# Patient Record
Sex: Female | Born: 1955
Health system: Southern US, Community
[De-identification: ages and names within clinical notes are randomized; demographics above are authoritative.]

## PROBLEM LIST (undated history)

## (undated) DIAGNOSIS — E079 Disorder of thyroid, unspecified: Secondary | ICD-10-CM

## (undated) DIAGNOSIS — R51 Headache: Secondary | ICD-10-CM

## (undated) DIAGNOSIS — M1990 Unspecified osteoarthritis, unspecified site: Secondary | ICD-10-CM

## (undated) DIAGNOSIS — I639 Cerebral infarction, unspecified: Secondary | ICD-10-CM

## (undated) DIAGNOSIS — E785 Hyperlipidemia, unspecified: Secondary | ICD-10-CM

## (undated) DIAGNOSIS — F329 Major depressive disorder, single episode, unspecified: Secondary | ICD-10-CM

## (undated) DIAGNOSIS — E039 Hypothyroidism, unspecified: Secondary | ICD-10-CM

## (undated) DIAGNOSIS — R748 Abnormal levels of other serum enzymes: Secondary | ICD-10-CM

## (undated) DIAGNOSIS — G43909 Migraine, unspecified, not intractable, without status migrainosus: Secondary | ICD-10-CM

## (undated) DIAGNOSIS — M810 Age-related osteoporosis without current pathological fracture: Secondary | ICD-10-CM

## (undated) DIAGNOSIS — F32A Depression, unspecified: Secondary | ICD-10-CM

## (undated) HISTORY — DX: Migraine, unspecified, not intractable, without status migrainosus: G43.909

## (undated) HISTORY — DX: Age-related osteoporosis without current pathological fracture: M81.0

## (undated) HISTORY — DX: Cerebral infarction, unspecified: I63.9

## (undated) HISTORY — PX: CHOLECYSTECTOMY: SHX55

## (undated) HISTORY — DX: Headache: R51

## (undated) HISTORY — PX: CATARACT EXTRACTION: SUR2

## (undated) HISTORY — DX: Major depressive disorder, single episode, unspecified: F32.9

## (undated) HISTORY — DX: Abnormal levels of other serum enzymes: R74.8

## (undated) HISTORY — DX: Depression, unspecified: F32.A

## (undated) HISTORY — PX: JOINT REPLACEMENT: SHX530

## (undated) HISTORY — DX: Hyperlipidemia, unspecified: E78.5

## (undated) HISTORY — DX: Disorder of thyroid, unspecified: E07.9

## (undated) HISTORY — DX: Unspecified osteoarthritis, unspecified site: M19.90

---

## 1999-04-04 ENCOUNTER — Other Ambulatory Visit: Admission: RE | Admit: 1999-04-04 | Discharge: 1999-04-04 | Payer: Self-pay | Admitting: *Deleted

## 1999-06-29 ENCOUNTER — Encounter: Payer: Self-pay | Admitting: *Deleted

## 1999-06-29 ENCOUNTER — Ambulatory Visit (HOSPITAL_COMMUNITY): Admission: RE | Admit: 1999-06-29 | Discharge: 1999-06-29 | Payer: Self-pay | Admitting: *Deleted

## 2000-12-03 DIAGNOSIS — I639 Cerebral infarction, unspecified: Secondary | ICD-10-CM

## 2000-12-03 HISTORY — DX: Cerebral infarction, unspecified: I63.9

## 2000-12-27 ENCOUNTER — Encounter: Payer: Self-pay | Admitting: *Deleted

## 2000-12-27 ENCOUNTER — Inpatient Hospital Stay (HOSPITAL_COMMUNITY): Admission: EM | Admit: 2000-12-27 | Discharge: 2000-12-29 | Payer: Self-pay | Admitting: Emergency Medicine

## 2001-01-23 ENCOUNTER — Encounter: Admission: RE | Admit: 2001-01-23 | Discharge: 2001-04-03 | Payer: Self-pay | Admitting: *Deleted

## 2001-09-18 ENCOUNTER — Encounter: Admission: RE | Admit: 2001-09-18 | Discharge: 2001-12-17 | Payer: Self-pay | Admitting: *Deleted

## 2001-12-10 ENCOUNTER — Ambulatory Visit (HOSPITAL_COMMUNITY): Admission: RE | Admit: 2001-12-10 | Discharge: 2001-12-10 | Payer: Self-pay | Admitting: *Deleted

## 2001-12-10 ENCOUNTER — Encounter: Payer: Self-pay | Admitting: *Deleted

## 2002-07-01 ENCOUNTER — Encounter: Admission: RE | Admit: 2002-07-01 | Discharge: 2002-09-29 | Payer: Self-pay | Admitting: *Deleted

## 2003-02-06 ENCOUNTER — Ambulatory Visit (HOSPITAL_COMMUNITY): Admission: RE | Admit: 2003-02-06 | Discharge: 2003-02-06 | Payer: Self-pay | Admitting: Family Medicine

## 2004-09-04 HISTORY — PX: LAPAROSCOPIC GASTRIC BANDING: SHX1100

## 2004-09-19 ENCOUNTER — Ambulatory Visit: Payer: Self-pay | Admitting: Internal Medicine

## 2004-09-21 ENCOUNTER — Ambulatory Visit (HOSPITAL_COMMUNITY): Admission: RE | Admit: 2004-09-21 | Discharge: 2004-09-21 | Payer: Self-pay | Admitting: Internal Medicine

## 2004-10-26 ENCOUNTER — Encounter: Admission: RE | Admit: 2004-10-26 | Discharge: 2004-10-26 | Payer: Self-pay | Admitting: *Deleted

## 2005-03-10 ENCOUNTER — Ambulatory Visit (HOSPITAL_COMMUNITY): Admission: RE | Admit: 2005-03-10 | Discharge: 2005-03-10 | Payer: Self-pay | Admitting: General Surgery

## 2005-03-15 ENCOUNTER — Ambulatory Visit (HOSPITAL_COMMUNITY): Admission: RE | Admit: 2005-03-15 | Discharge: 2005-03-15 | Payer: Self-pay | Admitting: General Surgery

## 2005-03-27 ENCOUNTER — Encounter: Admission: RE | Admit: 2005-03-27 | Discharge: 2005-06-25 | Payer: Self-pay | Admitting: General Surgery

## 2005-05-09 ENCOUNTER — Ambulatory Visit (HOSPITAL_COMMUNITY): Admission: RE | Admit: 2005-05-09 | Discharge: 2005-05-10 | Payer: Self-pay | Admitting: General Surgery

## 2005-08-03 ENCOUNTER — Encounter: Admission: RE | Admit: 2005-08-03 | Discharge: 2005-09-03 | Payer: Self-pay | Admitting: General Surgery

## 2005-09-27 ENCOUNTER — Encounter: Admission: RE | Admit: 2005-09-27 | Discharge: 2005-12-26 | Payer: Self-pay | Admitting: General Surgery

## 2005-12-27 ENCOUNTER — Encounter: Admission: RE | Admit: 2005-12-27 | Discharge: 2005-12-27 | Payer: Self-pay | Admitting: General Surgery

## 2006-04-30 ENCOUNTER — Encounter: Admission: RE | Admit: 2006-04-30 | Discharge: 2006-04-30 | Payer: Self-pay | Admitting: General Surgery

## 2006-06-18 ENCOUNTER — Encounter: Admission: RE | Admit: 2006-06-18 | Discharge: 2006-09-16 | Payer: Self-pay | Admitting: General Surgery

## 2007-07-18 ENCOUNTER — Ambulatory Visit (HOSPITAL_COMMUNITY): Admission: RE | Admit: 2007-07-18 | Discharge: 2007-07-18 | Payer: Self-pay | Admitting: Family Medicine

## 2008-12-28 ENCOUNTER — Other Ambulatory Visit: Admission: RE | Admit: 2008-12-28 | Discharge: 2008-12-28 | Payer: Self-pay | Admitting: Family Medicine

## 2008-12-31 ENCOUNTER — Ambulatory Visit (HOSPITAL_COMMUNITY): Admission: RE | Admit: 2008-12-31 | Discharge: 2008-12-31 | Payer: Self-pay | Admitting: Family Medicine

## 2010-02-09 ENCOUNTER — Ambulatory Visit (HOSPITAL_COMMUNITY): Admission: RE | Admit: 2010-02-09 | Discharge: 2010-02-09 | Payer: Self-pay | Admitting: Family Medicine

## 2010-02-25 ENCOUNTER — Encounter: Payer: Self-pay | Admitting: Family Medicine

## 2010-02-25 LAB — CONVERTED CEMR LAB
ALT: 19 units/L
AST: 18 units/L
Albumin: 4.4 g/dL
Alkaline Phosphatase: 150 units/L
Anion Gap: 12.5
BUN: 18 mg/dL
CO2: 28 meq/L
Calcium: 9.5 mg/dL
Chloride: 105 meq/L
Creatinine, Ser: 0.84 mg/dL
Glucose, Bld: 94 mg/dL
Potassium: 4.8 meq/L
Sodium: 141 meq/L
Total Bilirubin: 0.4 mg/dL
Total Protein: 6.9 g/dL

## 2010-04-06 ENCOUNTER — Ambulatory Visit: Payer: Self-pay | Admitting: Family Medicine

## 2010-04-06 DIAGNOSIS — E039 Hypothyroidism, unspecified: Secondary | ICD-10-CM | POA: Insufficient documentation

## 2010-04-06 DIAGNOSIS — Z8679 Personal history of other diseases of the circulatory system: Secondary | ICD-10-CM | POA: Insufficient documentation

## 2010-04-06 DIAGNOSIS — R51 Headache: Secondary | ICD-10-CM | POA: Insufficient documentation

## 2010-04-06 DIAGNOSIS — E785 Hyperlipidemia, unspecified: Secondary | ICD-10-CM | POA: Insufficient documentation

## 2010-04-06 DIAGNOSIS — Z87898 Personal history of other specified conditions: Secondary | ICD-10-CM | POA: Insufficient documentation

## 2010-04-06 DIAGNOSIS — F32A Depression, unspecified: Secondary | ICD-10-CM | POA: Insufficient documentation

## 2010-04-06 DIAGNOSIS — R519 Headache, unspecified: Secondary | ICD-10-CM | POA: Insufficient documentation

## 2010-04-06 DIAGNOSIS — F329 Major depressive disorder, single episode, unspecified: Secondary | ICD-10-CM

## 2010-04-06 DIAGNOSIS — IMO0001 Reserved for inherently not codable concepts without codable children: Secondary | ICD-10-CM | POA: Insufficient documentation

## 2010-04-08 LAB — CONVERTED CEMR LAB
Cholesterol: 207 mg/dL — ABNORMAL HIGH (ref 0–200)
Direct LDL: 135.2 mg/dL
HDL: 28.5 mg/dL — ABNORMAL LOW (ref >39.00)
TSH: 0.62 microintl units/mL (ref 0.35–5.50)
Total CHOL/HDL Ratio: 7
Triglycerides: 256 mg/dL — ABNORMAL HIGH (ref 0.0–149.0)
VLDL: 51.2 mg/dL — ABNORMAL HIGH (ref 0.0–40.0)

## 2010-10-04 NOTE — Assessment & Plan Note (Signed)
Summary: Traci [name] FROM [place]  [facility]   Vital Signs:  Patient profile:   55 year old female Height:      65.5 inches Weight:      210.25 pounds BMI:     34.58 Temp:     98.3 degrees F oral Pulse rate:   88 / minute Pulse rhythm:   regular BP sitting:   126 / 80  (left arm) Cuff size:   large  Vitals Entered By: Delilah Shan CMA  Dull) (April 06, 2010 3:08 PM) CC: Transfer from [place]   History of Present Illness: Leg cramps- Worse at rest.  Happens at  night.  Wakes the patient up.  Can walk w/o pain during the day. Recent CK wnl and reviewed.   Record release was done, delivery pending.  We need records re: prev Alk phos elevation and work up.   She has had labs done with mild increase in alk phos but other labs were normal.  Per recollection, the patient had prev work up for this and was unremarkable.  I have d/w patient ZO:XWRU today.  No abdominal pain, no NAV, no jaundice.   H/o CVA.  Unable to tolerate statin.  needs lipids done.   H/o hypothyroidism, needs TSH.  Unclear if this is related to the muscle cramps.   Preventive Screening-Counseling & Management  Alcohol-Tobacco     Smoking Status: never  Caffeine-Diet-Exercise     Does Patient Exercise: yes      Drug Use:  no.    Allergies (verified): 1)  ! Demerol 2)  ! Codeine 3)  ! Simvastatin  Past History:  Past Medical History: MIGRAINES, HX OF (ICD-V13.8) CEREBROVASCULAR ACCIDENT, HX OF (ICD-V12.50) HYPERTHYROIDISM (ICD-242.90) HYPERLIPIDEMIA (ICD-272.4) HEADACHE (ICD-784.0) DEPRESSION (ICD-311)= prev responded to wellbutrin    Past Surgical History: 2006    Lap Band Surgery 12/2000   Stroke Cholecystectomy   ? 20 years ago  Family History: Family History Diabetes 1st degree relative, parents Family History Hypertension, parents Family History Ovarian cancer, grandparents Family History of Stroke F 1st degree relative <60, parents Family History Uterine cancer, grandparents Family History of Heart  Disease, parents and grandparents  Social History: Marital Status: Single Children:  Occupation: Paralegal Never Smoked Alcohol use-no Drug use-no Regular exercise-yes Mother at [employer] [place].  Smoking Status:  never Drug Use:  no Does Patient Exercise:  yes  Review of Systems       See HPI.  Otherwise negative.    Physical Exam  General:  GEN: nad, alert and oriented HEENT: mucous membranes moist NECK: supple w/o LA, thyroid not tender to palpation  CV: rrr.  no murmur PULM: ctab, no inc wob ABD: soft, +bs EXT: no edema SKIN: no acute rash    Impression & Recommendations:  Problem # 1:  UNSPECIFIED HYPOTHYROIDISM (ICD-244.9) Contat with labs.  Her updated medication list for this problem includes:    Levothyroxine Sodium 125 Mcg Tabs (Levothyroxine sodium) .[name]... Take 1 tablet by mouth once a day  Orders: TLB-TSH (Thyroid Stimulating Hormone) (84443-TSH)  Problem # 2:  HYPERLIPIDEMIA (ICD-272.4) contact with labs. intolerant of statin.  Orders: TLB-Lipid Panel (80061-LIPID)  Problem # 3:  MUSCLE CRAMPS (ICD-729.82) D/w patient re: stretching. If TSH wnl and stretching doesn't help, then patient will call back and we can discuss options (ie flexeril).  no indication that this is vascular in nature.  DP pulses strong.   Complete Medication List: 1)  Levothyroxine Sodium 125 Mcg Tabs (Levothyroxine sodium) .... Take 1  tablet by mouth once a day 2)  Fish Oil Oil (Fish oil) .... Once daily 3)  Multivitamins Tabs (Multiple vitamin) .... Take 1 tablet by mouth once a day  Patient Instructions: 1)  We'll contact you with your lab report.  Let me know if the stretching helps.  If it doesn't, we can talk about options.   2)  Please schedule a follow-up appointment as needed .

## 2010-11-30 ENCOUNTER — Other Ambulatory Visit: Payer: Self-pay | Admitting: Oral Surgery

## 2010-11-30 DIAGNOSIS — M26629 Arthralgia of temporomandibular joint, unspecified side: Secondary | ICD-10-CM

## 2010-12-02 ENCOUNTER — Ambulatory Visit
Admission: RE | Admit: 2010-12-02 | Discharge: 2010-12-02 | Disposition: A | Discharge status: Home / Self Care | Payer: 59 | Source: Ambulatory Visit | Attending: Oral Surgery | Admitting: Oral Surgery

## 2010-12-02 DIAGNOSIS — M26629 Arthralgia of temporomandibular joint, unspecified side: Secondary | ICD-10-CM

## 2011-01-18 ENCOUNTER — Other Ambulatory Visit: Payer: Self-pay | Admitting: Family Medicine

## 2011-01-18 DIAGNOSIS — Z1231 Encounter for screening mammogram for malignant neoplasm of breast: Secondary | ICD-10-CM

## 2011-01-20 NOTE — Discharge Summary (Signed)
Traci [name]. [facility]  Patient:    Traci [name], Traci [name]                        MRN: [id number] Adm. Date:  [id number] Disc. Date: [id number] Attending:  Jetty Duhamel T CC:         Traci [name], M.D.  Traci [name], M.D.   Discharge Summary  DATE OF BIRTH:  2056-07-24  CONSULTS: Traci [name], M.D., neurology.  PROCEDURES:  None.  DISCHARGE DIAGNOSES:  1. Vertigo.  2. Gait disorder.  3. Dyslipidemia (HDL 27).  4. Hypothyroidism with mild over-replacement (thyroid stimulating     hormone 0.150).  5. History of migraine headaches.  6. Anemia.  Hemoglobin 11.3, mean corpuscular volume 89.1.  7. History of depression.  8. History of hypertension.  9. Cholecystectomy in 1994. 10. History of endometriosis. 11. Obesity.  DISCHARGE MEDICATIONS: 1. Lopid 600 mg p.o. b.i.d. 2. Synthroid 0.125 mg p.o. q.d. 3. Inderal LA 80 mg p.o. q.d. 4. Wellbutrin SR 150 mg p.o. b.i.d. 5. Enteric-coated aspirin 325 mg p.o. q.d. 6. Valium 2.5-5 mg p.o. t.i.d. p.r.n. dizziness.  DISCHARGE FOLLOW-UP:  The patient was advised to follow up with Traci [name], M.D., within the week.  Traci [name], M.D., is available as needed.  HISTORY OF PRESENT ILLNESS:  Traci [name] is a 55 year old woman who was in her usual state of health until the morning of admission when she awoke feeling dizzy and the sensation of the room waving.  She had felt slow and clumsy in her movements, but did not identify focal weakness.  She felt diffuse joint tightness/heaviness and had difficulty comprehending the speech of others.  She felt her comprehension was sluggish.  She went to work where her coworkers told her that she was slurring her words and speaking slowly and there was apparently a left facial droop.  She noted a "tunnel vision" pattern and found herself walking in a shuffling gait.  Since her arrival in the emergency room on the day of presentation,  her dizziness had improved as had her difficulty in comprehension, but she still felt very clumsy and was slurring her speech.  Jetty Duhamel, M.D., was requested to admit the patient.  For further details of her presentation, please see his report.  HOSPITAL COURSE: #1 - VERTIGO AND GAIT DISORDER:  Traci [name] had stable vital signs on arrival.  She was alert and oriented and deliberate in her speech with mild slurring.  A left facial droop was evidenced.  She exhibited retropulsion during Romberg and tandem gait.  There was no nystagmus noted.  CT scan showed no acute changes.  Laboratories were unrevealing.  The EKG was normal sinus rhythm at 71 beats per minute with no acute changes.  Posterior circulation insufficiency was suspected and IV heparin was initiated.  Traci [name], M.D., provided neurologic expertise.  Central vertigo with differential diagnosis, including CVA, multiple sclerosis, and a neoplasm was suspected.  MRI and MRA showed no acute or chronic CVA and only slight narrowing of the left A1 lateral segment of no consequence.  In general, unremarkable.  Carotid Dopplers were negative.  A 2-D echocardiogram was normal with an ejection fraction of 55-65%.  The neurologic exam improved with resolution of left facial droop and normalization of speech diction.  She was eventually able to walk while holding onto the wall rail for balance.  Ultimately, her diagnosis was unclear with possibilities including vestibular neuronitis versus  vertebrobasilar insufficiency.  She will be started on enteric-coated aspirin and correction of HDL will be of importance.  An empiric trial of low-dose Valium will be tried.  If Traci [name] does not improve, she is instructed to see attention from her doctors again.  She will remain out of work until her balance returns.  She is discharged in stable and improved condition.  #2 - DYSLIPIDEMIA:  Traci [name] has been on Lopid  for some time with the next cholesterol check in Dr. Al Decant. [place] office scheduled in June. If her lipids have not improved in that time, would recommend a statin.  #3 - HYPOTHYROIDISM:  Traci [name] dose was mildly reduced as the TSH showed mild overcorrection. DD:  12/29/00 TD:  12/31/00 Job: [id number]

## 2011-01-20 NOTE — Op Note (Signed)
NAME:  Traci [name], Traci [name]               ACCOUNT NO.:  [id number]   MEDICAL RECORD NO.:  [id number]          PATIENT TYPE:  AMB   LOCATION:  DAY                          FACILITY:  [facility]   PHYSICIAN:  Sharlet Salina T. Hoxworth, M.D.DATE OF BIRTH:  [birth year]   DATE OF PROCEDURE:  05/09/2005  DATE OF DISCHARGE:                                 OPERATIVE REPORT   PREOPERATIVE DIAGNOSES:  Morbid obesity, hypertension, dyslipidemia.   POSTOPERATIVE DIAGNOSES:  Morbid obesity, hypertension, dyslipidemia.   SURGICAL PROCEDURES:  Placement of laparoscopic adjustable gastric band.   SURGEON:  Lorne Skeens. Hoxworth, M.D.   ASSISTANT:  Sandria Bales. Ezzard Standing, M.D.   ANESTHESIA:  General.   BRIEF HISTORY:  Ms. [name] is a 55 year old female with progressive morbid  obesity unresponsive to medical management and comorbidities listed above.  After an extensive preoperative workup and discussion detailed elsewhere, we  have elected to proceed with laparoscopic adjustable gastric band placement  and the patient brought to the operating room for this procedure.   DESCRIPTION OF PROCEDURE:  The patient was brought to the operating room,  placed in supine position on the operating table and general endotracheal  anesthesia was induced. The abdomen was widely sterilely prepped and draped.  Correct patient and procedure were verified. She received preoperative IV  antibiotics and subcutaneous heparin. PAS were in place. Local anesthesia  was used to infiltrate the trocar sites prior to the incisions. A 1 cm  incision was made in the left subcostal margin and access obtained with an  OptiView trocar without difficulty and pneumoperitoneum established. Under  direct vision, a 12 mm trocar was placed through the falciform ligament in  the right paraxiphoid space, a 10 mm trocar in the right mid abdomen, a 10  mm trocar for the camera port just to the left of the umbilicus and a 5 mm  trocar in the right flank.  Through a 5 mm site, the Nathanson retractor was  placed and the left lobe of the liver elevated with excellent exposure of  the hiatus and fundus. There was no evidence of hiatal hernia. The  peritoneum at the angle of His was incised and dissection was carried down  along the left crus posteriorly toward the posterior stomach. Following  this, the lesser omentum was divided in the avascular area and the right  crus was identified, traced down to its base where there was crossing fat.  Just medial to this, the retroperitoneum was easily entered with the finger  dissector which was then passed without any resistance up to the angle of  His and was deployed through the previously dissected area without  difficulty. A 10 mL lap band system was then introduced at the 12 mm trocar  site. The tubing was passed through the finger retractor which was then  brought back posterior to the stomach without difficulty and the band  brought around posteriorly. The shoulder was freed from the posterior  tissue. The calibration tube was placed into the stomach, the tubing was  placed through the buckle and the band snapped into place without difficulty  and  under no tension. The calibration tube was able to be withdrawn without  any difficulty. Holding the tubing inferiorly, the fundus was imbricated up  over the band to the small gastric pouch with three interrupted Vicryl  sutures. The band was again seen to rotate easily with no undue tension.  Following this, the tubing was brought out through the right mid abdominal  trocar site, the [facility] retractor removed, all CO2 evacuated and trocars  removed. This incision was then extended somewhat laterally, the anterior  fascia exposed. Four #0 Prolene sutures placed into the fascia then through  the port which had been attached to the tubing. Tubing was reintroduced in  the abdomen, the port secured to the anterior abdominal wall. The port lay  1/2 to 1  cm medial to the lateral end of the incision. The incision was then  closed with the running subcutaneous 3-0 Vicryl at the port site and  staples. Sponge, needle and instrument counts correct. Dry sterile dressings  were applied and the patient taken to recovery in good condition.      Lorne Skeens. Hoxworth, M.D.  Electronically Signed     BTH/MEDQ  D:  05/09/2005  T:  05/09/2005  Job:  [id number]

## 2011-01-20 NOTE — Consult Note (Signed)
[place]. [facility]  Patient:    Traci [name], Traci [name]                        MRN: [id number] Proc. Date: 12/27/00 Adm. Date:  [id number] Attending:  Jetty Duhamel T CC:         Jetty Duhamel, M.D.  Al Decant. Janey Greaser, M.D.   Consultation Report  CHIEF COMPLAINT:  Dizziness.  HISTORY OF PRESENT ILLNESS:  I was asked by Jetty Duhamel, M.D. to see this 55 year old, left-handed, married woman who had sudden onset of dizziness and the room moving. She felt as if her body was going to move, I think, in a clockwise fashion, although she is somewhat contradictory about this.  She felt uncoordinated in her movements, although she was not weak. The patient did not have headache (she has a past history of migraines). She has not had any falls. She had dysarthria at work with definite slurring of her speech and left facial droop and had problems with her vision. She felt as if the room had dimmed and as if her visual fields narrowed considerably. She walks with a shuffling gait that was broad-based and had significant retropulsion according to Jetty Duhamel, M.D.  The patient has been in the emergency room since about 11:30 this morning. It is now 6:20 at night. The patient is no longer having dysarthria or facial weakness. When she gets up, she is more steady on her feet. However, she continues to have a feeling of the room spinning which is exacerbated by movement and associated with nausea. I was asked to see her to determine the etiology of her dysfunction and to make recommendations for further workup and treatment.  PAST MEDICAL HISTORY:  Remarkable for hypothyroidism, depression, hypertension, migraine headaches, endometriosis, and obesity.  PAST SURGICAL HISTORY:  Cholecystectomy in 1994.  CURRENT MEDICATIONS: 1. Synthroid 0.15 mg per day. 2. Inderal LA 80 mg per day. 3. Wellbutrin SR 150 mg b.i.d. 4. Lopid 600 mg b.i.d. 5. Trazodone 150 mg  q.h.s.  ALLERGIES:  None.  INTOLERANCES:  CODEINE (itch), DEMEROL (syncope).  SOCIAL HISTORY:  The patient lives in [place]. She works in a Materials engineer. She does not use tobacco, alcohol. She has no children. She is married.  FAMILY HISTORY:  Mother has diabetes mellitus and coronary artery disease and is in her 93s. Father died of myocardial infarction age 56. She has a sister and brother who are both healthy.  REVIEW OF SYSTEMS:  The patient has had not any intercurrent infections in the head, neck, lungs, GI, GU. No fevers. No rash, anemia, bruisibility, diabetes. She has hypothyroidism. She has not had any shortness of breath, chest pain, amaurosis fugax. She has had a past history of migraine headaches but is not having complaints of that at this time. Review of systems is otherwise negative.  PHYSICAL EXAMINATION:  VITAL SIGNS:  Temperature 97.1, blood pressure 156/58, resting pulse 76, respirations 22.  GENERAL:  This is a pleasant, well-developed, obese, right-handed woman in no distress.  HEENT:  No signs of infection, particularly in her tympanic membranes.  NECK:  Supple. Full range of motion. No cranial or cervical bruits.  LUNGS:  Clear to auscultation.  HEART:  No murmurs. Pulses normal.  ABDOMEN:  Soft, nontender. Bowel sounds normal. Protuberant. There is no tenderness.  EXTREMITIES:  Well-formed, without edema, cyanosis, alterations in tone or tight heel cords.  NEUROLOGICAL:  Mental state:  The patient was awake, alert,  attentive, appropriate. No dysphagia or dyspraxia. Cranial nerve examination:  Round, reactive pupils. Normal fundi. Full visual fields full to double simultaneous stimuli. Extraocular movements full and conjugate. OKN response is equal bilaterally. Symmetric facial strength. Midline tongue and uvula. Air conduction greater than bone conduction. Barany maneuver causes a feeling of clockwise movement of the world with either the  right or the left ear down. It is more intense with the right ear down; and in both cases, she had nausea to the point of wretching.  Sensation intact to cold, vibration, and stereoagnosis. Cerebellar examination:  Good finger-to-nose. rapid repetitive movements. Gait and station was slightly broad-based. Feet pointed outward. She had difficulty with tandem. There was some windmilling of her arms. She could get up on her toes and heels. Deep tendon reflexes were diminished to normal at the knees and ankles, diminished at the biceps, virtually absent at the triceps and brachioradialis. She had bilateral flexor plantar responses. Romberg response showed some retropulsion. There was no sign of nystagmus either at rest or with the [facility] maneuver.  LABORATORY DATA:  I have reviewed the CT scan of the brain without contrast, and it is normal. I have reviewed the basic metabolic and CBC and EKG and find no definite abnormalities.  IMPRESSION: 1. Vertigo, likely central. Etiology of cerebrovascular accident, multiple    sclerosis, neoplasm (unlikely). 2. Hypertension. 3. Hypothyroidism. 4. Depression. 5. Migraines.  I do not believe that this is a migraine variant. I doubt that the patient has a dissection because she does not have a headache. She certainly has no other structural problems that I can see on her CT scan.  PLAN:  MRI of the brain without and with contrast, MRA intracranial, carotid Doppler study. We may need to perform a lumbar puncture if the MRI is suggestive of multiple sclerosis.  I appreciate the opportunity to see Ms. Frisk. We will give her Valium for her vertigo initially and start off with Phenergan. If Phenergan cannot control her nausea, we will consider droperidol. DD:  12/27/00 TD:  12/28/00 Job: [id number]

## 2011-02-13 ENCOUNTER — Ambulatory Visit (HOSPITAL_COMMUNITY): Payer: 59

## 2011-06-07 ENCOUNTER — Encounter: Payer: Self-pay | Admitting: Family Medicine

## 2011-06-08 ENCOUNTER — Ambulatory Visit (INDEPENDENT_AMBULATORY_CARE_PROVIDER_SITE_OTHER): Payer: 59 | Admitting: Family Medicine

## 2011-06-08 ENCOUNTER — Encounter: Payer: Self-pay | Admitting: Family Medicine

## 2011-06-08 VITALS — BP 132/80 | HR 91 | Temp 98.2°F | Wt 212.1 lb

## 2011-06-08 DIAGNOSIS — M79609 Pain in unspecified limb: Secondary | ICD-10-CM

## 2011-06-08 DIAGNOSIS — M79603 Pain in arm, unspecified: Secondary | ICD-10-CM

## 2011-06-08 DIAGNOSIS — R319 Hematuria, unspecified: Secondary | ICD-10-CM

## 2011-06-08 DIAGNOSIS — M549 Dorsalgia, unspecified: Secondary | ICD-10-CM

## 2011-06-08 DIAGNOSIS — N390 Urinary tract infection, site not specified: Secondary | ICD-10-CM

## 2011-06-08 LAB — POCT URINALYSIS DIPSTICK
Bilirubin, UA: NEGATIVE
Glucose, UA: NEGATIVE
Ketones, UA: NEGATIVE
Nitrite, UA: NEGATIVE
Protein, UA: NEGATIVE
Spec Grav, UA: 1.015
Urobilinogen, UA: NEGATIVE
pH, UA: 6.5

## 2011-06-08 MED ORDER — TRAMADOL HCL 50 MG PO TABS
50.0000 mg | ORAL_TABLET | Freq: Four times a day (QID) | ORAL | Status: DC | PRN
Start: 2011-06-08 — End: 2011-07-28

## 2011-06-08 MED ORDER — SULFAMETHOXAZOLE-TRIMETHOPRIM 800-160 MG PO TABS: 1.0000 | ORAL_TABLET | Freq: Two times a day (BID) | ORAL | Status: AC

## 2011-06-08 MED ORDER — LEVOTHYROXINE SODIUM 125 MCG PO TABS: 125.0000 ug | ORAL_TABLET | Freq: Every day | ORAL | Status: DC

## 2011-06-08 NOTE — Progress Notes (Signed)
Dysuria.  New sx.  Blood in urine earlier in the week.  No burning with urination but had some frequency.  No fevers.  Now w/o visible blood in urine.    Arms and legs are hurting.  Longstanding for last few months.  She had aches before on statin, it got better off the statin but then returned now.  Sx worse in R leg, B arms and R lower back.  Leg and arms hurt all the time, worse at the end of the day.  Aleve helped some.  Pain feels like it is in the muscles in the arms but the L hip and knee hurt.  Numbness on R lat thigh.  Back pain is longstanding.  No new trauma/falls. Laying flat with knees to chest helps some.    Mother died 2011/05/03.  She was with her when she died.  She doesn't think she's depressed; she went through therapy prev.  She feels like her mother is in a better place and pt feels better about her situation.   Meds, vitals, and allergies reviewed.   ROS: See HPI.  Otherwise, noncontributory.  nad ncat Mmm rrr ctab B shoulder exam: +cuff impingement, pain with int > ext rotation.  Pain with supraspinatus testing.  R SI joint tender with testing with local spasm noted.  R meralgia paresthetica noted.   Suprapubic area not ttp.  Normal BS  Blood and LE in u/a.  Ucx pending.

## 2011-06-08 NOTE — Patient Instructions (Signed)
We'll contact you with your lab report. Use the tramadol for pain and work on the shoulder/back exercises.   Let me know if you aren't improving.

## 2011-06-09 ENCOUNTER — Encounter: Payer: Self-pay | Admitting: Family Medicine

## 2011-06-09 NOTE — Assessment & Plan Note (Signed)
Septra, ucx, fluids, nontoxic.

## 2011-06-09 NOTE — Assessment & Plan Note (Signed)
Likely with SI pain and spasm.  I don't think this is related to the back pain.  D/w pt about stretching and use tramadol prn.  She agrees.

## 2011-06-09 NOTE — Assessment & Plan Note (Signed)
Likely B cuff findings.  D/w pt about cuff anatomy and gave handout on exercise.  She understood.  She was better with int rotation with scap assist.  Will follow clinically.

## 2011-06-10 LAB — URINE CULTURE
Colony Count: NO GROWTH
Organism ID, Bacteria: NO GROWTH

## 2011-07-12 ENCOUNTER — Other Ambulatory Visit: Payer: Self-pay | Admitting: Family Medicine

## 2011-07-12 DIAGNOSIS — I639 Cerebral infarction, unspecified: Secondary | ICD-10-CM

## 2011-07-12 DIAGNOSIS — E039 Hypothyroidism, unspecified: Secondary | ICD-10-CM

## 2011-07-17 ENCOUNTER — Other Ambulatory Visit (INDEPENDENT_AMBULATORY_CARE_PROVIDER_SITE_OTHER): Payer: 59

## 2011-07-17 DIAGNOSIS — I6350 Cerebral infarction due to unspecified occlusion or stenosis of unspecified cerebral artery: Secondary | ICD-10-CM

## 2011-07-17 DIAGNOSIS — I639 Cerebral infarction, unspecified: Secondary | ICD-10-CM

## 2011-07-17 DIAGNOSIS — E039 Hypothyroidism, unspecified: Secondary | ICD-10-CM

## 2011-07-18 LAB — COMPREHENSIVE METABOLIC PANEL
ALT: 22 U/L (ref 0–35)
AST: 23 U/L (ref 0–37)
Albumin: 3.8 g/dL (ref 3.5–5.2)
Alkaline Phosphatase: 154 U/L — ABNORMAL HIGH (ref 39–117)
BUN: 15 mg/dL (ref 6–23)
CO2: 27 mEq/L (ref 19–32)
Calcium: 8.7 mg/dL (ref 8.4–10.5)
Chloride: 107 mEq/L (ref 96–112)
Creatinine, Ser: 0.7 mg/dL (ref 0.4–1.2)
GFR: 90.24 mL/min (ref >60.00)
Glucose, Bld: 80 mg/dL (ref 70–99)
Potassium: 4.3 mEq/L (ref 3.5–5.1)
Sodium: 141 mEq/L (ref 135–145)
Total Bilirubin: 0.7 mg/dL (ref 0.3–1.2)
Total Protein: 6.6 g/dL (ref 6.0–8.3)

## 2011-07-18 LAB — LIPID PANEL
Cholesterol: 168 mg/dL (ref 0–200)
HDL: 31.3 mg/dL — ABNORMAL LOW (ref >39.00)
Total CHOL/HDL Ratio: 5
Triglycerides: 260 mg/dL — ABNORMAL HIGH (ref 0.0–149.0)
VLDL: 52 mg/dL — ABNORMAL HIGH (ref 0.0–40.0)

## 2011-07-18 LAB — TSH: TSH: 0.63 u[IU]/mL (ref 0.35–5.50)

## 2011-07-18 LAB — LDL CHOLESTEROL, DIRECT: Direct LDL: 103.6 mg/dL

## 2011-07-21 ENCOUNTER — Encounter: Payer: 59 | Admitting: Family Medicine

## 2011-07-28 ENCOUNTER — Ambulatory Visit (INDEPENDENT_AMBULATORY_CARE_PROVIDER_SITE_OTHER): Payer: 59 | Admitting: Family Medicine

## 2011-07-28 ENCOUNTER — Encounter: Payer: Self-pay | Admitting: Family Medicine

## 2011-07-28 VITALS — BP 132/86 | HR 87 | Temp 98.2°F | Wt 210.1 lb

## 2011-07-28 DIAGNOSIS — E785 Hyperlipidemia, unspecified: Secondary | ICD-10-CM

## 2011-07-28 DIAGNOSIS — Z23 Encounter for immunization: Secondary | ICD-10-CM

## 2011-07-28 DIAGNOSIS — Z0000 Encounter for general adult medical examination without abnormal findings: Secondary | ICD-10-CM

## 2011-07-28 DIAGNOSIS — E039 Hypothyroidism, unspecified: Secondary | ICD-10-CM

## 2011-07-28 DIAGNOSIS — M549 Dorsalgia, unspecified: Secondary | ICD-10-CM

## 2011-07-28 DIAGNOSIS — Z8679 Personal history of other diseases of the circulatory system: Secondary | ICD-10-CM

## 2011-07-28 MED ORDER — LEVOTHYROXINE SODIUM 125 MCG PO TABS: 125.0000 ug | ORAL_TABLET | Freq: Every day | ORAL | Status: DC

## 2011-07-28 MED ORDER — TRAMADOL HCL 50 MG PO TABS: 50.0000 mg | ORAL_TABLET | Freq: Four times a day (QID) | ORAL | Status: DC | PRN

## 2011-07-28 NOTE — Progress Notes (Signed)
CPE- See plan.  Routine anticipatory guidance given to patient.  See health maintenance.  HLD.  LDL improved, statin intolerant.  H/o CVA.  S/p lab band, so she isn't on ASA.  We talked about diet and exercise.   Hypothyroid.  TSH wnl. No ant neck pain, complaint with meds.  Labs d/w pt.  No dysphagia  Back pain and R leg pain.  Better with tramdol. Chronic condition.  Pain with standing . No weakness.  She went to PT years ago, w/o much relief.  She didn't do well with home exercises, pain was increased.   PMH and SH reviewed  Meds, vitals, and allergies reviewed.   ROS: See HPI.  Otherwise negative.    GEN: nad, alert and oriented HEENT: mucous membranes moist NECK: supple w/o LA, thyroid not ttp CV: rrr. PULM: ctab, no inc wob ABD: soft, +bs EXT: no edema SKIN: no acute rash R SI tender on testing and palpation

## 2011-07-28 NOTE — Patient Instructions (Signed)
Keep walking.  Recheck labs in 1 year.  See Shirlee Limerick about your referral before you leave today. Take care.  Glad to see you.

## 2011-07-30 ENCOUNTER — Encounter: Payer: Self-pay | Admitting: Family Medicine

## 2011-07-30 DIAGNOSIS — Z0000 Encounter for general adult medical examination without abnormal findings: Secondary | ICD-10-CM | POA: Insufficient documentation

## 2011-07-30 NOTE — Assessment & Plan Note (Signed)
Continue current meds 

## 2011-07-30 NOTE — Assessment & Plan Note (Signed)
As above.

## 2011-07-30 NOTE — Assessment & Plan Note (Signed)
No asa with prev lap band, statin intolerant.  Continue diet and exercise for lipids.

## 2011-07-30 NOTE — Assessment & Plan Note (Signed)
She'll call about mammogram. Colonoscopy done at 49 per patient.  Tdap today, pap not indicated.  Flu already done.  Healthy habits encouraged.

## 2011-07-30 NOTE — Assessment & Plan Note (Signed)
Continue tramadol, refer to ortho.

## 2011-08-21 ENCOUNTER — Encounter: Payer: Self-pay | Admitting: Family Medicine

## 2011-08-25 ENCOUNTER — Other Ambulatory Visit: Payer: Self-pay | Admitting: Orthopedic Surgery

## 2011-08-25 DIAGNOSIS — M545 Low back pain, unspecified: Secondary | ICD-10-CM

## 2011-08-30 ENCOUNTER — Ambulatory Visit
Admission: RE | Admit: 2011-08-30 | Discharge: 2011-08-30 | Disposition: A | Discharge status: Home / Self Care | Payer: 59 | Source: Ambulatory Visit | Attending: Orthopedic Surgery | Admitting: Orthopedic Surgery

## 2011-08-30 DIAGNOSIS — M545 Low back pain, unspecified: Secondary | ICD-10-CM

## 2011-10-26 ENCOUNTER — Ambulatory Visit (INDEPENDENT_AMBULATORY_CARE_PROVIDER_SITE_OTHER): Payer: 59 | Admitting: Family Medicine

## 2011-10-26 ENCOUNTER — Encounter: Payer: Self-pay | Admitting: Family Medicine

## 2011-10-26 VITALS — BP 128/84 | HR 88 | Temp 98.2°F | Ht 65.5 in | Wt 211.2 lb

## 2011-10-26 DIAGNOSIS — J40 Bronchitis, not specified as acute or chronic: Secondary | ICD-10-CM

## 2011-10-26 MED ORDER — AZITHROMYCIN 250 MG PO TABS: ORAL_TABLET | ORAL | Status: AC

## 2011-10-26 NOTE — Assessment & Plan Note (Signed)
Recent worsening after what sounds like viral URTI. will cover atypicals with zpack. Lungs full and clear throughout, vitals stable and good O2 sats.. To update [place] if sxs worsening or not improving as expected. See pt instructions.

## 2011-10-26 NOTE — Patient Instructions (Signed)
You have bronchitis.  Treat with zpack given duration. Please let us know if not improving as expected or any worsening cough, fever >101, worsening shortness of breath. Continue cough syrup.  Push fluids and plenty of rest.  Simple mucinex may help as well but with plenty of fluid. Good to see you today, call us with questions.

## 2011-10-26 NOTE — Progress Notes (Signed)
Subjective:    Patient ID: Traci [name], female    DOB: 1956/04/29, 56 y.o.   MRN: [id number]  HPI CC: cough, SOB  2 wks ago had cold, with cough.  Cough never went away.  Last night significant sinus drainage and congestion.  Then today started having trouble breathing.  No energy.  + SOB.  + tightness in chest, rubbing with warm hand feels relief.  Stretching also improves tightness.  Initially tried corcedin HBP and tussin [employer].  No recent fevers/chills, abd pain, nausea/vomiting, diaphoresis, ear pain, tooth pain, headaches.  No chest pain.  No sinus pain or pressure.  No productive mucous.  No smokers at home.  + sick contacts recently - home, work.  No h/o asthma, COPD.  No immobility recently or prolonged trips.  No personal or family history of blood clots.  Medications and allergies reviewed and updated in chart.  Past histories reviewed and updated if relevant as below. Patient Active Problem List  Diagnoses  . UNSPECIFIED HYPOTHYROIDISM  . HYPERLIPIDEMIA  . DEPRESSION  . MUSCLE CRAMPS  . HEADACHE  . CEREBROVASCULAR ACCIDENT, HX OF  . MIGRAINES, HX OF  . Arm pain  . Back pain  . Routine general medical examination at a health care facility   Past Medical History  Diagnosis Date  . Thyroid disease     Hyperthyroidism  . Hyperlipidemia   . Depression     Previously responded to Wellbutrin  . Headache   . Migraines   . Stroke 12/2000   Past Surgical History  Procedure Date  . Laparoscopic gastric banding 2006  . Cholecystectomy     ? 20 years ago   History  Substance Use Topics  . Smoking status: Never Smoker   . Smokeless tobacco: Not on file  . Alcohol Use: No   Family History  Problem Relation Age of Onset  . Cancer Other     Ovarian, Uterine  . Heart disease Other   . Stroke Mother    Allergies  Allergen Reactions  . Codeine   . Meperidine Hcl     syncope  . Simvastatin     REACTION: muscle aches   Current Outpatient  Prescriptions on File Prior to Visit  Medication Sig Dispense Refill  . levothyroxine (SYNTHROID, LEVOTHROID) 125 MCG tablet Take 1 tablet (125 mcg total) by mouth daily.  90 tablet  3  . Multiple Vitamin (MULTIVITAMIN) tablet Take 1 tablet by mouth daily.        . fish oil-omega-3 fatty acids 1000 MG capsule Take 1 g by mouth daily.        . traMADol (ULTRAM) 50 MG tablet Take 1 tablet (50 mg total) by mouth every 6 (six) hours as needed for pain.  90 tablet  3    Review of Systems Per HPI    Objective:   Physical Exam  Nursing note and vitals reviewed. Constitutional: She appears well-developed and well-nourished. No distress.  HENT:  Head: Normocephalic and atraumatic.  Right Ear: Hearing, tympanic membrane, external ear and ear canal normal.  Left Ear: Hearing, tympanic membrane, external ear and ear canal normal.  Nose: Nose normal. No mucosal edema or rhinorrhea. Right sinus exhibits no maxillary sinus tenderness and no frontal sinus tenderness. Left sinus exhibits no maxillary sinus tenderness and no frontal sinus tenderness.  Mouth/Throat: Uvula is midline, oropharynx is clear and moist and mucous membranes are normal. No oropharyngeal exudate, posterior oropharyngeal edema, posterior oropharyngeal erythema or tonsillar abscesses.  Eyes: Conjunctivae and EOM are normal. Pupils are equal, round, and reactive to light. No scleral icterus.  Neck: Normal range of motion. Neck supple.  Cardiovascular: Normal rate, regular rhythm, normal heart sounds and intact distal pulses.   No murmur heard. Pulmonary/Chest: Effort normal and breath sounds normal. No respiratory distress. She has no wheezes. She has no rales.       Clear throughout.  Hacking cough present  Musculoskeletal: She exhibits no edema.  Lymphadenopathy:    She has no cervical adenopathy.  Skin: Skin is warm and dry. No rash noted.  Psychiatric: She has a normal mood and affect.      Assessment & Plan:

## 2012-03-18 ENCOUNTER — Encounter: Payer: Self-pay | Admitting: Family Medicine

## 2012-03-18 ENCOUNTER — Encounter: Payer: Self-pay | Admitting: *Deleted

## 2012-03-18 ENCOUNTER — Ambulatory Visit (INDEPENDENT_AMBULATORY_CARE_PROVIDER_SITE_OTHER): Payer: 59 | Admitting: Family Medicine

## 2012-03-18 VITALS — BP 138/96 | HR 87 | Temp 98.2°F | Wt 214.0 lb

## 2012-03-18 DIAGNOSIS — Z566 Other physical and mental strain related to work: Secondary | ICD-10-CM | POA: Insufficient documentation

## 2012-03-18 DIAGNOSIS — Z569 Unspecified problems related to employment: Secondary | ICD-10-CM

## 2012-03-18 DIAGNOSIS — E039 Hypothyroidism, unspecified: Secondary | ICD-10-CM

## 2012-03-18 DIAGNOSIS — I499 Cardiac arrhythmia, unspecified: Secondary | ICD-10-CM

## 2012-03-18 LAB — TSH: TSH: 0.49 u[IU]/mL (ref 0.35–5.50)

## 2012-03-18 MED ORDER — DIAZEPAM 2 MG PO TABS: 1.0000 mg | ORAL_TABLET | Freq: Three times a day (TID) | ORAL | Status: DC | PRN

## 2012-03-18 NOTE — Patient Instructions (Addendum)
We'll contact you with your lab report. Ask about seeing Dr. Laymond Purser, either here or in [place].  Take the valium as needed- sedation caution.  Call me with an update.   Keep walking for exercise.   Take care.

## 2012-03-18 NOTE — Progress Notes (Signed)
"  Work is getting to me".  Inc in stress at work. BP was 140s/80s at work.  Work stress worse recently.  "Too much work and too little time."  Working for [employer].  She's tearful and nauseated.  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 She doesn't feel skipped beats. She's gaining weight but her exercise tolerance is still good.  Walking nightly.  She has support from friends.  No SI/HI.    Meds, vitals, and allergies reviewed.   ROS: See HPI.  Otherwise, noncontributory.  nad ncat Affect wnl most of the time but the appears worried about work situation rrr ctab abd soft, not ttp Ext w/o edema  EKG reviewed.

## 2012-03-19 ENCOUNTER — Encounter: Payer: Self-pay | Admitting: Family Medicine

## 2012-03-19 NOTE — Assessment & Plan Note (Signed)
Refer to psychology for counseling, start low dose of prn diazepam with routine cautions (will need other meds if used/needed frequently and Traci [name] understands this).  Out of work today, allow 1/2 days for this week o/w.  No SI/Hi.  Okay for outpatient f/u.  It doesn't appear that this is a primary cardiac/vascular event.  Traci [name] feels better out of work and has good exercise tolerance w/o sx.  >25 min spent with face to face with patient, >50% counseling and/or coordinating care

## 2012-03-22 ENCOUNTER — Telehealth: Payer: Self-pay

## 2012-03-22 MED ORDER — DIAZEPAM 2 MG PO TABS: 2.0000 mg | ORAL_TABLET | Freq: Three times a day (TID) | ORAL | Status: AC | PRN

## 2012-03-22 NOTE — Telephone Encounter (Signed)
Pt saw Dr Para March on Mon; BP today 140/84, not sure rate of pulse but still skipping. Pt feels no better; still stressed and has that stress type chest pain that she had when seen Mon. Pt taking Valium 2 mg daily; not helping.[employer].Please advise.

## 2012-03-22 NOTE — Telephone Encounter (Signed)
I called and LMOVM for patient.  It would be okay to take 2 valium at a time.  Please call her on Monday and get an update.

## 2012-03-25 ENCOUNTER — Telehealth: Payer: Self-pay | Admitting: Family Medicine

## 2012-03-25 NOTE — Telephone Encounter (Signed)
Called and LMOVM.  Will await return call from patient.

## 2012-03-25 NOTE — Telephone Encounter (Signed)
Patient told to call with update after doubling up on her Valium as instructed 03/22/12.  BP 110/74, pulse still having "skips."  Feeling groggy and its hard to function but she is back to work 8hr/days this week.  Declined triage or appt.  Would like to wait and hear from MD.

## 2012-03-25 NOTE — Telephone Encounter (Signed)
LMOVM to return call.

## 2012-03-26 NOTE — Telephone Encounter (Signed)
Pt left v/m for Dr Para March to call pt at [phone] from 8 am to 5 pm. Ask for [name].Please advise.

## 2012-03-26 NOTE — Telephone Encounter (Signed)
Pt left v/m for Dr Para March to call [phone] from 8 am - 5 pm. Ask for [name].

## 2012-03-26 NOTE — Telephone Encounter (Signed)
BP recheck 110/70.  She is still reportedly skipping beats on check at work.  Valium isn't helping with anxiety at work.  She was slightly fatigued from the valium.  I still highly doubt primary cardiac source, given the duration.  It appears that this is anxiety/work related, or at least contributing.  No speech changes.  No focal neuro changes.  We talked about options.   She has f/u with Dr. Laymond Purser pending.  She'll keep that appointment.   She'll cut back on valium to see if energy level improves.  It is reasonable to try an antacid to see if she has some relief.   She'll call back with update, consider trial of SSRI ie celexa if not improved.   She agrees.

## 2012-03-27 NOTE — Telephone Encounter (Signed)
Prev addressed, see following phone note.

## 2012-04-02 ENCOUNTER — Ambulatory Visit (INDEPENDENT_AMBULATORY_CARE_PROVIDER_SITE_OTHER): Payer: 59 | Admitting: Psychology

## 2012-04-02 DIAGNOSIS — F4323 Adjustment disorder with mixed anxiety and depressed mood: Secondary | ICD-10-CM

## 2012-04-17 ENCOUNTER — Ambulatory Visit (INDEPENDENT_AMBULATORY_CARE_PROVIDER_SITE_OTHER): Payer: 59 | Admitting: Psychology

## 2012-04-17 DIAGNOSIS — F4323 Adjustment disorder with mixed anxiety and depressed mood: Secondary | ICD-10-CM

## 2012-07-25 ENCOUNTER — Telehealth: Payer: Self-pay

## 2012-07-25 DIAGNOSIS — M899 Disorder of bone, unspecified: Secondary | ICD-10-CM

## 2012-07-25 DIAGNOSIS — M949 Disorder of cartilage, unspecified: Secondary | ICD-10-CM

## 2012-07-25 NOTE — Telephone Encounter (Signed)
Pt seen at [facility] orthopedic for knee problem; orthopedic dr gave pt note needs bone density, Vit D3 and calcium level (DX: 733.90. Pt wants to get labs at [facility] and wants Dr Para March to also order bone density.Please advise.

## 2012-07-25 NOTE — Telephone Encounter (Signed)
Orders are in   Thanks

## 2012-07-29 ENCOUNTER — Telehealth: Payer: Self-pay | Admitting: *Deleted

## 2012-07-29 NOTE — Telephone Encounter (Signed)
Patient called in saying that she saw Jearld Adjutant at [facility] and he requested that she ask you to order a DEXA scan and get a Vitamin D3 and Calcium Level  Dx: 733.90.  Is this agreeable with you?

## 2012-07-29 NOTE — Telephone Encounter (Signed)
Ordered last week.  See prev notes.

## 2012-07-30 NOTE — Telephone Encounter (Signed)
She called and someone sent her to my voicemail and she left this message.  Sorry for the duplication.

## 2012-08-02 ENCOUNTER — Ambulatory Visit (INDEPENDENT_AMBULATORY_CARE_PROVIDER_SITE_OTHER)
Admission: RE | Admit: 2012-08-02 | Discharge: 2012-08-02 | Disposition: A | Discharge status: Home / Self Care | Payer: 59 | Source: Ambulatory Visit

## 2012-08-02 DIAGNOSIS — M899 Disorder of bone, unspecified: Secondary | ICD-10-CM

## 2012-08-04 ENCOUNTER — Encounter: Payer: Self-pay | Admitting: Family Medicine

## 2012-08-04 DIAGNOSIS — M810 Age-related osteoporosis without current pathological fracture: Secondary | ICD-10-CM | POA: Insufficient documentation

## 2012-08-04 DIAGNOSIS — M8580 Other specified disorders of bone density and structure, unspecified site: Secondary | ICD-10-CM | POA: Insufficient documentation

## 2012-08-13 ENCOUNTER — Encounter: Payer: Self-pay | Admitting: *Deleted

## 2012-08-15 ENCOUNTER — Telehealth: Payer: Self-pay

## 2012-08-15 NOTE — Telephone Encounter (Signed)
Pt wants to schedule lab time for Vit D and Calcium Dr Para March has already ordered. Lab appt 08/21/12 at 7:50 am.pt voiced understanding.

## 2012-08-21 ENCOUNTER — Other Ambulatory Visit (INDEPENDENT_AMBULATORY_CARE_PROVIDER_SITE_OTHER): Payer: 59

## 2012-08-21 DIAGNOSIS — M949 Disorder of cartilage, unspecified: Secondary | ICD-10-CM

## 2012-08-21 DIAGNOSIS — M899 Disorder of bone, unspecified: Secondary | ICD-10-CM

## 2012-08-21 LAB — BASIC METABOLIC PANEL
BUN: 13 mg/dL (ref 6–23)
CO2: 28 mEq/L (ref 19–32)
Calcium: 8.9 mg/dL (ref 8.4–10.5)
Chloride: 106 mEq/L (ref 96–112)
Creatinine, Ser: 0.8 mg/dL (ref 0.4–1.2)
GFR: 81.85 mL/min (ref >60.00)
Glucose, Bld: 98 mg/dL (ref 70–99)
Potassium: 4.2 mEq/L (ref 3.5–5.1)
Sodium: 140 mEq/L (ref 135–145)

## 2012-08-22 LAB — VITAMIN D 25 HYDROXY (VIT D DEFICIENCY, FRACTURES): Vit D, 25-Hydroxy: 29 ng/mL — ABNORMAL LOW (ref 30–89)

## 2012-08-24 ENCOUNTER — Encounter: Payer: Self-pay | Admitting: Family Medicine

## 2012-08-24 DIAGNOSIS — M25569 Pain in unspecified knee: Secondary | ICD-10-CM | POA: Insufficient documentation

## 2012-09-09 ENCOUNTER — Other Ambulatory Visit: Payer: Self-pay | Admitting: *Deleted

## 2012-09-09 MED ORDER — LEVOTHYROXINE SODIUM 125 MCG PO TABS: 125.0000 ug | ORAL_TABLET | Freq: Every day | ORAL | Status: DC

## 2012-09-19 ENCOUNTER — Encounter: Payer: Self-pay | Admitting: Family Medicine

## 2012-09-19 ENCOUNTER — Ambulatory Visit (INDEPENDENT_AMBULATORY_CARE_PROVIDER_SITE_OTHER): Payer: 59 | Admitting: Family Medicine

## 2012-09-19 ENCOUNTER — Encounter (INDEPENDENT_AMBULATORY_CARE_PROVIDER_SITE_OTHER): Payer: 59

## 2012-09-19 VITALS — BP 120/88 | HR 103 | Temp 98.1°F | Wt 216.0 lb

## 2012-09-19 DIAGNOSIS — R600 Localized edema: Secondary | ICD-10-CM

## 2012-09-19 DIAGNOSIS — M79606 Pain in leg, unspecified: Secondary | ICD-10-CM

## 2012-09-19 DIAGNOSIS — R609 Edema, unspecified: Secondary | ICD-10-CM

## 2012-09-19 DIAGNOSIS — M79609 Pain in unspecified limb: Secondary | ICD-10-CM

## 2012-09-19 DIAGNOSIS — M7989 Other specified soft tissue disorders: Secondary | ICD-10-CM

## 2012-09-19 DIAGNOSIS — R52 Pain, unspecified: Secondary | ICD-10-CM

## 2012-09-19 NOTE — Patient Instructions (Addendum)
See Shirlee Limerick about your referral before you leave today. Take care.

## 2012-09-19 NOTE — Progress Notes (Signed)
R knee pain.  Planned replacement per ortho later in 2014.   H/o CVA years ago, no ASA now due to h/o lap band.  Was able to tolerate the lap band after the CVA w/o complication.  No other new neuro sx in the meantime.    She is worried about going through the surgery for her knee. She is worried about varicose veins and possible DVT.  She does have some varicose veins in the R leg and some occ swelling.    We discussed risks and benefits of surgery.  She has lifestyle limiting knee pain and that is bothersome to patient.    No FH of DVT.  No personal h/o DVT.    Meds, vitals, and allergies reviewed.   ROS: See HPI.  Otherwise, noncontributory.  nad ncat Mmm rrr ctab abd soft R leg with trace edema R knee puffy, not red Varicose veins noted

## 2012-09-20 DIAGNOSIS — R600 Localized edema: Secondary | ICD-10-CM | POA: Insufficient documentation

## 2012-09-20 NOTE — Assessment & Plan Note (Signed)
>  25 min spent with face to face with patient, >50% counseling and/or coordinating care.  Prelim u/s w/o DVT.  Await final report.  D/w pt about her options and it appears reasonable to pursue surgery.  It would be reasonable to have some apprehension about surgery in general, but she appears to be a reasonable candidate.  I will defer to ortho.  We discussed in detail.

## 2012-09-26 ENCOUNTER — Telehealth: Payer: Self-pay | Admitting: Family Medicine

## 2012-09-26 MED ORDER — BENZONATATE 200 MG PO CAPS: 200.0000 mg | ORAL_CAPSULE | Freq: Three times a day (TID) | ORAL | Status: AC | PRN

## 2012-09-26 NOTE — Telephone Encounter (Signed)
Pt advised.

## 2012-09-26 NOTE — Telephone Encounter (Signed)
If sputum is clear, she isn't SOB, and no fever, then I would use tessalon for cough.  rx sent.  Thanks.

## 2012-09-26 NOTE — Telephone Encounter (Signed)
Patient Information:  Caller Name: Deanndra  Phone: [phone]  Patient: Traci [name]  Gender: Female  DOB: [birth year]  Age: 57 Years  PCP: Crawford Givens Clelia Croft) [facility])  Office Follow Up:  Does the office need to follow up with this patient?: Yes  Instructions For The Office: Please follow up on script request - see nurses notes for details.  RN Note:  Cough is productive with clear sputum. And patient is coughing throughout the day and night. Reviewed Over the counter cough expectorant with patient of Guafenisin to thin out secretions.  Patient has been taking cough medication with Dextromethorphan included.  Patient is requesting that a message be sent to see if the doctor will call in a prescription for cough medication. Preferred Pharmacy [facility].  Symptoms  Reason For Call & Symptoms: Cough, nasal congestion  Reviewed Health History In EMR: Yes  Reviewed Medications In EMR: Yes  Reviewed Allergies In EMR: Yes  Reviewed Surgeries / Procedures: Yes  Date of Onset of Symptoms: 09/24/2012  Treatments Tried: Aspirin, Cough DM  Treatments Tried Worked: No  Guideline(s) Used:  Colds  Disposition Per Guideline:   Home Care  Reason For Disposition Reached:   Colds with no complications  Advice Given:  For a Runny Nose With Profuse Discharge:   Nasal mucus and discharge helps to wash viruses and bacteria out of the nose and sinuses.  Blowing the nose is all that is needed.  If the skin around your nostrils gets irritated, apply a tiny amount of petroleum ointment to the nasal openings once or twice a day.  For a Stuffy Nose - Use Nasal Washes:  Introduction: Saline (salt water) nasal irrigation (nasal wash) is an effective and simple home remedy for treating stuffy nose and sinus congestion. The nose can be irrigated by pouring, spraying, or squirting salt water into the nose and then letting it run back out.  How it Helps: The salt water rinses out  excess mucus, washes out any irritants (dust, allergens) that might be present, and moistens the nasal cavity.  Methods: There are several ways to perform nasal irrigation. You can use a saline nasal spray bottle (available over-the-counter), a rubber ear syringe, a medical syringe without the needle, or a Neti Pot.  Step-By-Step Instructions:   Step 1: Lean over a sink.  Step 2: Gently squirt or spray warm salt water into one of your nostrils.  Step 3: Some of the water may run into the back of your throat. Spit this out. If you swallow the salt water it will not hurt you.  Step 4: Blow your nose to clean out the water and mucus.  Step 5: Repeat steps 1-4 for the other nostril. You can do this a couple times a day if it seems to help you.  Humidifier:  If the air in your home is dry, use a cool-mist humidifier  Expected Course:   Fever may last 2-3 days  Nasal discharge 7-14 days  Cough up to 2-3 weeks.  Call Back If:  Difficulty breathing occurs  Nasal discharge lasts more than 10 days  Cough lasts more than 3 weeks  You become worse  Patient Refused Recommendation:  Patient Requests Prescription  Patient is requesting that provider call in a prescription for cough medicine.

## 2012-10-04 ENCOUNTER — Telehealth: Payer: Self-pay | Admitting: Family Medicine

## 2012-10-04 NOTE — Telephone Encounter (Signed)
Noted  

## 2012-10-04 NOTE — Telephone Encounter (Signed)
Patient Information:  Caller Name: Elania Crowl  Phone: [phone]  Patient: Traci [name]  Gender: Female  DOB: 1956-03-11  Age: 57 Years  PCP: Crawford Givens Clelia Croft) [facility])  Office Follow Up:  Does the office need to follow up with this patient?: No  Instructions For The Office: N/A  RN Note:  Reviewed Voice Hoarseness Protocol and advised home treatment along with call back parameters. Caller will call back if hoarseness > 2 weeks. instructed to use Humidier and fluids.  Symptoms  Reason For Call & Symptoms: Patient states  she was seen in office on an unrelated issues on 09/19/12.  She became sick with head cold, nasal drainage and called the office on 09/26/12.  She was given Tessalon Pearls for Cough.  She states she is no longer coughing but has been hoarse for a week. Patient voice is very hoarse and raspy.  Voice is scratchy. No sore throat pain no fever. Lungs clear and had nurse at work evaluate her.  Reviewed Health History In EMR: Yes  Reviewed Medications In EMR: Yes  Reviewed Allergies In EMR: Yes  Reviewed Surgeries / Procedures: No  Date of Onset of Symptoms: 09/25/2012  Guideline(s) Used:  Sore Throat  Disposition Per Guideline:   Home Care  Reason For Disposition Reached:   Sore throat  Advice Given:  For Relief of Sore Throat Pain:  Sip warm chicken broth or apple juice.  Suck on hard candy or a throat lozenge (over-the-counter).  Gargle warm salt water 3 times daily (1 teaspoon of salt in 8 oz or 240 ml of warm water).  Avoid cigarette smoke.  Pain Medicines:  Acetaminophen (e.g., Tylenol):  Regular Strength Tylenol: Take 650 mg (two 325 mg pills) by mouth every 4-6 hours as needed. Each Regular Strength Tylenol pill has 325 mg of acetaminophen.  Ibuprofen (e.g., Motrin, Advil):  Take 400 mg (two 200 mg pills) by mouth every 6 hours.  Soft Diet:   Cold drinks and milk shakes are especially good (Reason: swollen tonsils can make some foods  hard to swallow).  Liquids:  Adequate liquid intake is important to prevent dehydration. Drink 6-8 glasses of water per day.  Call Back If:  Sore throat is the main symptom and it lasts longer than 24 hours  Sore throat is mild but lasts longer than 4 days  Fever lasts longer than 3 days  You become worse.

## 2012-10-08 ENCOUNTER — Encounter: Payer: Self-pay | Admitting: Family Medicine

## 2012-10-08 ENCOUNTER — Ambulatory Visit (INDEPENDENT_AMBULATORY_CARE_PROVIDER_SITE_OTHER): Payer: 59 | Admitting: Family Medicine

## 2012-10-08 VITALS — BP 136/92 | HR 92 | Temp 97.6°F | Wt 216.8 lb

## 2012-10-08 DIAGNOSIS — R490 Dysphonia: Secondary | ICD-10-CM | POA: Insufficient documentation

## 2012-10-08 MED ORDER — PREDNISONE 10 MG PO TABS: 10.0000 mg | ORAL_TABLET | Freq: Every day | ORAL | Status: DC

## 2012-10-08 NOTE — Progress Notes (Signed)
She feels well now except for her hoarse voice.  No FCNAVD.  No ear pain, no rhinorrhea, no cough.  Prev URI from about 2 weeks ago is resolved.  Has been gargling with mouthwash, listerine.  Voice doesn't vary much during the day, possibly some worse early AM.  She is trying not to talk.  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  No dysphagia.  She cut out caffeine.  No heartburn.    Meds, vitals, and allergies reviewed.   ROS: See HPI.  Otherwise, noncontributory.  GEN: nad, alert and oriented HEENT: mucous membranes moist, tm wnl, nasal and OP exam wnl.  NECK: supple w/o LA CV: rrr.  PULM: ctab, no inc wob Hoarse voice noted.  No stridor

## 2012-10-08 NOTE — Patient Instructions (Addendum)
Take the prednisone with food, once a day.  Rest your voice.  Stop the antiseptic mouthwashes.   Take care.

## 2012-10-08 NOTE — Assessment & Plan Note (Signed)
Acute onset, likely from URI.  Should improve with brief course of low dose of oral steroids.  Steroid caution given.  She wants to try to treat this for some relief.  This is reasonable.  F/u prn.  GI cautions given. Nontoxic.  Rest voice as much as possible.

## 2012-10-14 ENCOUNTER — Telehealth: Payer: Self-pay | Admitting: Family Medicine

## 2012-10-14 NOTE — Telephone Encounter (Signed)
Patient Information:  Caller Name: Tranisha  Phone: [phone]  Patient: Traci, [name]  Gender: Female  DOB: 09/12/1955  Age: 57 Years  PCP: Crawford Givens Clelia Croft) [facility])  Office Follow Up:  Does the office need to follow up with this patient?: Yes  Instructions For The Office: Office please review and call pt regarding any further orders MD may have for her since she was just seen on 10/08/12.  Sx have not improved but are no worse.  RN Note: RN triaged per Sore Throat or Hoarseness Guideline (in [facility]).  Dispostion See in 24 hours due to evaluated by provider and no improvement with sx following treatment plan for 48 hours.  Symptoms  Reason For Call & Symptoms: Hx: Thyroid; Synthroid, MVI; Fish oil, Calcium; Allergies: Codeine and Demerol; No recent sx   Pt is calling and states that she has had a hoarse voice for 4 weeks; seen in the office on 10/08/12;  given antibiotic for 5 days with no improvement; completed antibiotic on 10/12/12;  no other sx noted except hoarse voice; sx are not worsening but there are no improvement; just wanted to give MD a heads up that there are no improvement;  no sore throat; offered appt but pt denied  Reviewed Health History In EMR: Yes  Reviewed Medications In EMR: Yes  Reviewed Allergies In EMR: Yes  Reviewed Surgeries / Procedures: Yes  Date of Onset of Symptoms: 09/25/2012  Treatments Tried: Salt water gargles  Treatments Tried Worked: No  Guideline(s) Used:  No Protocol Available - Sick Adult  Disposition Per Guideline:   See Today or Tomorrow in Office  Reason For Disposition Reached:   Nursing judgment  Advice Given:  Call Back If:  New symptoms develop  You become worse.  Patient Refused Recommendation:  Patient Refused Care Advice  Pt states she would like MD to be made ware of her progress and whatever he would like for her to do would be fine.  Please call pt back with further instructions.

## 2012-10-14 NOTE — Telephone Encounter (Signed)
If she isn't improved at all we can send her for ENT eval.  The other option would be to give this a little longer, assuming she is swallowing well.  Thanks.

## 2012-10-14 NOTE — Telephone Encounter (Signed)
LMOVM to return call.

## 2012-10-15 NOTE — Telephone Encounter (Signed)
Patient advised.  She says she would like to give it until next Monday and if she is not better then, she will call for the ENT referral.

## 2012-10-30 ENCOUNTER — Other Ambulatory Visit: Payer: Self-pay | Admitting: Orthopaedic Surgery

## 2012-11-11 ENCOUNTER — Encounter (HOSPITAL_COMMUNITY): Payer: Self-pay

## 2012-11-12 ENCOUNTER — Other Ambulatory Visit (HOSPITAL_COMMUNITY): Payer: 59

## 2012-11-12 NOTE — Pre-Procedure Instructions (Signed)
Traci [name]  11/12/2012   Your procedure is scheduled on:  Tuesday November 26, 2012  Report to [facility] at 5:30 AM.  Call this number if you have problems the morning of surgery: [phone]   Remember:   Do not eat food or drink liquids after midnight.   Take these medicines the morning of surgery with A SIP OF WATER: levothyroxine   Do not wear jewelry, make-up or nail polish.  Do not wear lotions, powders, or perfumes.  Do not shave 48 hours prior to surgery.   Do not bring valuables to the hospital.  Contacts, dentures or bridgework may not be worn into surgery.  Leave suitcase in the car. After surgery it may be brought to your room.  For patients admitted to the hospital, checkout time is 11:00 AM the day of  discharge.   Patients discharged the day of surgery will not be allowed to drive  home.  Name and phone number of your driver: family / friend  Special Instructions: Shower using CHG 2 nights before surgery and the night before surgery.  If you shower the day of surgery use CHG.  Use special wash - you have one bottle of CHG for all showers.  You should use approximately 1/3 of the bottle for each shower.   Please read over the following fact sheets that you were given: Pain Booklet, Coughing and Deep Breathing, Blood Transfusion Information, Total Joint Packet, MRSA Information and Surgical Site Infection Prevention

## 2012-11-13 ENCOUNTER — Encounter (HOSPITAL_COMMUNITY)
Admission: RE | Admit: 2012-11-13 | Discharge: 2012-11-13 | Disposition: A | Discharge status: Home / Self Care | Payer: 59 | Source: Ambulatory Visit | Attending: Orthopaedic Surgery | Admitting: Orthopaedic Surgery

## 2012-11-13 ENCOUNTER — Encounter (HOSPITAL_COMMUNITY): Payer: Self-pay

## 2012-11-13 HISTORY — DX: Hypothyroidism, unspecified: E03.9

## 2012-11-13 LAB — URINALYSIS, ROUTINE W REFLEX MICROSCOPIC
Bilirubin Urine: NEGATIVE
Glucose, UA: NEGATIVE mg/dL
Hgb urine dipstick: NEGATIVE
Ketones, ur: NEGATIVE mg/dL
Nitrite: NEGATIVE
Protein, ur: NEGATIVE mg/dL
Specific Gravity, Urine: 1.019 (ref 1.005–1.030)
Urobilinogen, UA: 0.2 mg/dL (ref 0.0–1.0)
pH: 6.5 (ref 5.0–8.0)

## 2012-11-13 LAB — CBC WITH DIFFERENTIAL/PLATELET
Basophils Absolute: 0.1 10*3/uL (ref 0.0–0.1)
Basophils Relative: 1 % (ref 0–1)
Eosinophils Absolute: 0.4 10*3/uL (ref 0.0–0.7)
Eosinophils Relative: 5 % (ref 0–5)
HCT: 38.7 % (ref 36.0–46.0)
Hemoglobin: 13.2 g/dL (ref 12.0–15.0)
Lymphocytes Relative: 27 % (ref 12–46)
Lymphs Abs: 2.2 10*3/uL (ref 0.7–4.0)
MCH: 29.7 pg (ref 26.0–34.0)
MCHC: 34.1 g/dL (ref 30.0–36.0)
MCV: 87 fL (ref 78.0–100.0)
Monocytes Absolute: 0.7 10*3/uL (ref 0.1–1.0)
Monocytes Relative: 9 % (ref 3–12)
Neutro Abs: 4.8 10*3/uL (ref 1.7–7.7)
Neutrophils Relative %: 59 % (ref 43–77)
Platelets: 265 10*3/uL (ref 150–400)
RBC: 4.45 MIL/uL (ref 3.87–5.11)
RDW: 13.4 % (ref 11.5–15.5)
WBC: 8.2 10*3/uL (ref 4.0–10.5)

## 2012-11-13 LAB — PROTIME-INR
INR: 0.88 (ref 0.00–1.49)
Prothrombin Time: 11.9 seconds (ref 11.6–15.2)

## 2012-11-13 LAB — ABO/RH: ABO/RH(D): A POS

## 2012-11-13 LAB — BASIC METABOLIC PANEL
BUN: 14 mg/dL (ref 6–23)
CO2: 26 mEq/L (ref 19–32)
Calcium: 9.3 mg/dL (ref 8.4–10.5)
Chloride: 104 mEq/L (ref 96–112)
Creatinine, Ser: 0.71 mg/dL (ref 0.50–1.10)
GFR calc Af Amer: >90 mL/min (ref >90)
GFR calc non Af Amer: >90 mL/min (ref >90)
Glucose, Bld: 90 mg/dL (ref 70–99)
Potassium: 4.3 mEq/L (ref 3.5–5.1)
Sodium: 140 mEq/L (ref 135–145)

## 2012-11-13 LAB — APTT: aPTT: 28 seconds (ref 24–37)

## 2012-11-13 LAB — TYPE AND SCREEN
ABO/RH(D): A POS
Antibody Screen: NEGATIVE

## 2012-11-13 LAB — SURGICAL PCR SCREEN
MRSA, PCR: NEGATIVE
Staphylococcus aureus: NEGATIVE

## 2012-11-13 LAB — URINE MICROSCOPIC-ADD ON

## 2012-11-24 NOTE — H&P (Signed)
TOTAL KNEE ADMISSION H&P  Patient is being admitted for right total knee arthroplasty.  Subjective:  Chief Complaint:right knee pain.  HPI: Traci [name], 57 y.o. female, has a history of pain and functional disability in the right knee due to arthritis and has failed non-surgical conservative treatments for greater than 12 weeks to includeNSAID's and/or analgesics, corticosteriod injections, flexibility and strengthening excercises, weight reduction as appropriate and activity modification.  Onset of symptoms was gradual, starting 5 years ago with gradually worsening course since that time. The patient noted no past surgery on the right knee(s).  Patient currently rates pain in the right knee(s) at 9 out of 10 with activity. Patient has night pain, worsening of pain with activity and weight bearing, pain that interferes with activities of daily living, pain with passive range of motion and crepitus.  Patient has evidence of subchondral sclerosis, periarticular osteophytes and joint space narrowing by imaging studies. This patient has had no surg.. There is no active infection.  Patient Active Problem List   Diagnosis Date Noted  . Hoarseness of voice 10/08/2012  . Leg edema 09/20/2012  . Knee pain 08/24/2012  . Osteopenia 08/04/2012  . Work-related stress 03/18/2012  . Routine general medical examination at a health care facility 07/30/2011  . Back pain 06/08/2011  . UNSPECIFIED HYPOTHYROIDISM 04/06/2010  . HYPERLIPIDEMIA 04/06/2010  . DEPRESSION 04/06/2010  . MUSCLE CRAMPS 04/06/2010  . HEADACHE 04/06/2010  . CEREBROVASCULAR ACCIDENT, HX OF 04/06/2010  . MIGRAINES, HX OF 04/06/2010   Past Medical History  Diagnosis Date  . Hyperlipidemia   . Depression     Previously responded to Wellbutrin  . Headache   . Migraines   . Stroke 12/2000  . Thyroid disease     Hypothyroidism  . Abnormal alkaline phosphatase test     with neg eval prev  . Hypothyroidism     Past Surgical  History  Procedure Laterality Date  . Laparoscopic gastric banding  2006  . Cholecystectomy      ? 20 years ago    No prescriptions prior to admission   Allergies  Allergen Reactions  . Codeine Itching  . Meperidine Hcl     syncope  . Simvastatin     REACTION: muscle aches    History  Substance Use Topics  . Smoking status: Never Smoker   . Smokeless tobacco: Never Used  . Alcohol Use: No    Family History  Problem Relation Age of Onset  . Cancer Other     Ovarian, Uterine  . Heart disease Other   . Stroke Mother      Review of Systems  Constitutional: Negative.   HENT: Negative.   Eyes: Negative.   Respiratory: Negative.   Cardiovascular: Negative.   Gastrointestinal: Negative.   Genitourinary: Negative.   Musculoskeletal: Positive for joint pain.  Skin: Negative.   Neurological: Negative.   Endo/Heme/Allergies: Negative.   Psychiatric/Behavioral: Negative.     Objective:  Physical Exam  Constitutional: She appears well-nourished.  HENT:  Head: Normocephalic.  Eyes: Pupils are equal, round, and reactive to light.  Neck: Normal range of motion.  Cardiovascular: Normal rate.   Respiratory: Effort normal.  GI: Soft.  Musculoskeletal:  r knee 5-100. 1+ crepitation and severe med. Joint line pain. + Mcmurrays  Neurological: She has normal reflexes.  Skin: Skin is warm.  Psychiatric: She has a normal mood and affect.    Vital signs in last 24 hours:    Labs:   Estimated body  mass index is 35.38 kg/(m^2) as calculated from the following:   Height as of 10/26/11: 5' 5.5" (1.664 m).   Weight as of 09/19/12: 97.977 kg (216 lb).   Imaging Review Plain radiographs demonstrate severe degenerative joint disease of the right knee(s). The overall alignment isneutral. The bone quality appears to be good for age and reported activity level.  Assessment/Plan:  End stage arthritis, right knee   The patient history, physical examination, clinical judgment of  the provider and imaging studies are consistent with end stage degenerative joint disease of the right knee(s) and total knee arthroplasty is deemed medically necessary. The treatment options including medical management, injection therapy arthroscopy and arthroplasty were discussed at length. The risks and benefits of total knee arthroplasty were presented and reviewed. The risks due to aseptic loosening, infection, stiffness, patella tracking problems, thromboembolic complications and other imponderables were discussed. The patient acknowledged the explanation, agreed to proceed with the plan and consent was signed. Patient is being admitted for inpatient treatment for surgery, pain control, PT, OT, prophylactic antibiotics, VTE prophylaxis, progressive ambulation and ADL's and discharge planning. The patient is planning to be discharged to skilled nursing facility

## 2012-11-25 MED ORDER — CHLORHEXIDINE GLUCONATE 4 % EX LIQD: 60.0000 mL | Freq: Once | CUTANEOUS | Status: DC

## 2012-11-25 MED ORDER — CEFAZOLIN SODIUM-DEXTROSE 2-3 GM-% IV SOLR
2.0000 g | INTRAVENOUS | Status: AC
  Administered 2012-11-26: 2 g via INTRAVENOUS
  Filled 2012-11-25: qty 50

## 2012-11-26 ENCOUNTER — Inpatient Hospital Stay (HOSPITAL_COMMUNITY): Payer: 59 | Admitting: Anesthesiology

## 2012-11-26 ENCOUNTER — Encounter (HOSPITAL_COMMUNITY): Payer: Self-pay | Admitting: Anesthesiology

## 2012-11-26 ENCOUNTER — Encounter (HOSPITAL_COMMUNITY): Payer: Self-pay

## 2012-11-26 ENCOUNTER — Encounter (HOSPITAL_COMMUNITY)
Admission: RE | Disposition: A | Discharge status: Skilled Nursing Facility | Payer: Self-pay | Source: Ambulatory Visit | Attending: Orthopaedic Surgery

## 2012-11-26 ENCOUNTER — Inpatient Hospital Stay (HOSPITAL_COMMUNITY)
Admission: RE | Admit: 2012-11-26 | Discharge: 2012-11-29 | DRG: 470 | Disposition: A | Discharge status: Skilled Nursing Facility | Payer: 59 | Source: Ambulatory Visit | Attending: Orthopaedic Surgery | Admitting: Orthopaedic Surgery

## 2012-11-26 DIAGNOSIS — Z01812 Encounter for preprocedural laboratory examination: Secondary | ICD-10-CM

## 2012-11-26 DIAGNOSIS — Z79899 Other long term (current) drug therapy: Secondary | ICD-10-CM

## 2012-11-26 DIAGNOSIS — E039 Hypothyroidism, unspecified: Secondary | ICD-10-CM | POA: Diagnosis present

## 2012-11-26 DIAGNOSIS — Z8249 Family history of ischemic heart disease and other diseases of the circulatory system: Secondary | ICD-10-CM

## 2012-11-26 DIAGNOSIS — M1710 Unilateral primary osteoarthritis, unspecified knee: Principal | ICD-10-CM | POA: Diagnosis present

## 2012-11-26 DIAGNOSIS — Z7982 Long term (current) use of aspirin: Secondary | ICD-10-CM

## 2012-11-26 DIAGNOSIS — E785 Hyperlipidemia, unspecified: Secondary | ICD-10-CM | POA: Diagnosis present

## 2012-11-26 DIAGNOSIS — Z888 Allergy status to other drugs, medicaments and biological substances status: Secondary | ICD-10-CM

## 2012-11-26 DIAGNOSIS — Z8673 Personal history of transient ischemic attack (TIA), and cerebral infarction without residual deficits: Secondary | ICD-10-CM

## 2012-11-26 DIAGNOSIS — Z823 Family history of stroke: Secondary | ICD-10-CM

## 2012-11-26 DIAGNOSIS — F329 Major depressive disorder, single episode, unspecified: Secondary | ICD-10-CM | POA: Diagnosis present

## 2012-11-26 DIAGNOSIS — M1711 Unilateral primary osteoarthritis, right knee: Secondary | ICD-10-CM

## 2012-11-26 DIAGNOSIS — F3289 Other specified depressive episodes: Secondary | ICD-10-CM | POA: Diagnosis present

## 2012-11-26 HISTORY — PX: TOTAL KNEE ARTHROPLASTY: SHX125

## 2012-11-26 SURGERY — ARTHROPLASTY, KNEE, TOTAL
Anesthesia: Choice | Site: Knee | Laterality: Right | Wound class: Clean

## 2012-11-26 MED ORDER — CEFAZOLIN SODIUM-DEXTROSE 2-3 GM-% IV SOLR
2.0000 g | Freq: Four times a day (QID) | INTRAVENOUS | Status: AC
Start: 2012-11-26 — End: 2012-11-26
  Administered 2012-11-26 (×2): 2 g via INTRAVENOUS
  Filled 2012-11-26 (×2): qty 50

## 2012-11-26 MED ORDER — PROPOFOL 10 MG/ML IV BOLUS
INTRAVENOUS | Status: DC | PRN
  Administered 2012-11-26: 200 mg via INTRAVENOUS

## 2012-11-26 MED ORDER — MENTHOL 3 MG MT LOZG: 1.0000 | LOZENGE | OROMUCOSAL | Status: DC | PRN

## 2012-11-26 MED ORDER — METOCLOPRAMIDE HCL 10 MG PO TABS: 5.0000 mg | ORAL_TABLET | Freq: Three times a day (TID) | ORAL | Status: DC | PRN

## 2012-11-26 MED ORDER — ACETAMINOPHEN 10 MG/ML IV SOLN
INTRAVENOUS | Status: AC
  Filled 2012-11-26: qty 100

## 2012-11-26 MED ORDER — DEXTROSE 5 % IV SOLN
500.0000 mg | Freq: Four times a day (QID) | INTRAVENOUS | Status: DC | PRN
  Filled 2012-11-26: qty 5

## 2012-11-26 MED ORDER — LACTATED RINGERS IV SOLN: INTRAVENOUS | Status: DC

## 2012-11-26 MED ORDER — METOCLOPRAMIDE HCL 5 MG/ML IJ SOLN: 5.0000 mg | Freq: Three times a day (TID) | INTRAMUSCULAR | Status: DC | PRN

## 2012-11-26 MED ORDER — ONDANSETRON HCL 4 MG/2ML IJ SOLN: 4.0000 mg | Freq: Once | INTRAMUSCULAR | Status: DC | PRN

## 2012-11-26 MED ORDER — METHOCARBAMOL 500 MG PO TABS
500.0000 mg | ORAL_TABLET | Freq: Four times a day (QID) | ORAL | Status: DC | PRN
  Administered 2012-11-26 – 2012-11-29 (×6): 500 mg via ORAL
  Filled 2012-11-26 (×5): qty 1

## 2012-11-26 MED ORDER — FERROUS SULFATE 325 (65 FE) MG PO TABS
325.0000 mg | ORAL_TABLET | Freq: Every day | ORAL | Status: DC
  Administered 2012-11-27 – 2012-11-29 (×3): 325 mg via ORAL
  Filled 2012-11-26 (×5): qty 1

## 2012-11-26 MED ORDER — FENTANYL CITRATE 0.05 MG/ML IJ SOLN
INTRAMUSCULAR | Status: AC
  Administered 2012-11-26: 100 ug via INTRAVENOUS
  Filled 2012-11-26: qty 2

## 2012-11-26 MED ORDER — HYDROMORPHONE HCL PF 1 MG/ML IJ SOLN
0.2500 mg | INTRAMUSCULAR | Status: DC | PRN
  Administered 2012-11-26 (×4): 0.5 mg via INTRAVENOUS

## 2012-11-26 MED ORDER — ONDANSETRON HCL 4 MG/2ML IJ SOLN
INTRAMUSCULAR | Status: DC | PRN
  Administered 2012-11-26: 4 mg via INTRAVENOUS

## 2012-11-26 MED ORDER — ASPIRIN EC 325 MG PO TBEC
325.0000 mg | DELAYED_RELEASE_TABLET | Freq: Two times a day (BID) | ORAL | Status: DC
  Administered 2012-11-27 – 2012-11-29 (×5): 325 mg via ORAL
  Filled 2012-11-26 (×7): qty 1

## 2012-11-26 MED ORDER — ACETAMINOPHEN 10 MG/ML IV SOLN
INTRAVENOUS | Status: DC | PRN
  Administered 2012-11-26: 1000 mg via INTRAVENOUS

## 2012-11-26 MED ORDER — FENTANYL CITRATE 0.05 MG/ML IJ SOLN
INTRAMUSCULAR | Status: DC | PRN
  Administered 2012-11-26: 100 ug via INTRAVENOUS
  Administered 2012-11-26: 50 ug via INTRAVENOUS

## 2012-11-26 MED ORDER — PHENOL 1.4 % MT LIQD: 1.0000 | OROMUCOSAL | Status: DC | PRN

## 2012-11-26 MED ORDER — DIPHENHYDRAMINE HCL 12.5 MG/5ML PO ELIX: 12.5000 mg | ORAL_SOLUTION | ORAL | Status: DC | PRN

## 2012-11-26 MED ORDER — HYDROMORPHONE HCL PF 1 MG/ML IJ SOLN
INTRAMUSCULAR | Status: AC
Start: 2012-11-26 — End: 2012-11-26
  Filled 2012-11-26: qty 1

## 2012-11-26 MED ORDER — DOCUSATE SODIUM 100 MG PO CAPS
100.0000 mg | ORAL_CAPSULE | Freq: Two times a day (BID) | ORAL | Status: DC
  Administered 2012-11-26 – 2012-11-29 (×6): 100 mg via ORAL
  Filled 2012-11-26 (×7): qty 1

## 2012-11-26 MED ORDER — HYDROMORPHONE HCL PF 1 MG/ML IJ SOLN
INTRAMUSCULAR | Status: AC
  Filled 2012-11-26: qty 1

## 2012-11-26 MED ORDER — OXYCODONE HCL 5 MG PO TABS
5.0000 mg | ORAL_TABLET | ORAL | Status: DC | PRN
  Administered 2012-11-26 – 2012-11-29 (×9): 10 mg via ORAL
  Filled 2012-11-26 (×11): qty 2

## 2012-11-26 MED ORDER — HYDROMORPHONE HCL PF 1 MG/ML IJ SOLN
0.5000 mg | INTRAMUSCULAR | Status: DC | PRN
  Administered 2012-11-26 – 2012-11-27 (×5): 1 mg via INTRAVENOUS
  Filled 2012-11-26 (×5): qty 1

## 2012-11-26 MED ORDER — LIDOCAINE HCL (CARDIAC) 20 MG/ML IV SOLN
INTRAVENOUS | Status: DC | PRN
  Administered 2012-11-26: 50 mg via INTRAVENOUS

## 2012-11-26 MED ORDER — METHOCARBAMOL 500 MG PO TABS
ORAL_TABLET | ORAL | Status: AC
  Filled 2012-11-26: qty 1

## 2012-11-26 MED ORDER — ONDANSETRON HCL 4 MG/2ML IJ SOLN
4.0000 mg | Freq: Four times a day (QID) | INTRAMUSCULAR | Status: DC | PRN
  Administered 2012-11-26 – 2012-11-27 (×2): 4 mg via INTRAVENOUS
  Filled 2012-11-26 (×2): qty 2

## 2012-11-26 MED ORDER — BISACODYL 10 MG RE SUPP: 10.0000 mg | Freq: Every day | RECTAL | Status: DC | PRN

## 2012-11-26 MED ORDER — LEVOTHYROXINE SODIUM 125 MCG PO TABS
125.0000 ug | ORAL_TABLET | Freq: Every day | ORAL | Status: DC
  Administered 2012-11-27 – 2012-11-29 (×3): 125 ug via ORAL
  Filled 2012-11-26 (×5): qty 1

## 2012-11-26 MED ORDER — SODIUM CHLORIDE 0.9 % IR SOLN
Status: DC | PRN
  Administered 2012-11-26: 3000 mL
  Administered 2012-11-26: 1000 mL

## 2012-11-26 MED ORDER — ALUM & MAG HYDROXIDE-SIMETH 200-200-20 MG/5ML PO SUSP: 30.0000 mL | ORAL | Status: DC | PRN

## 2012-11-26 MED ORDER — BUPIVACAINE-EPINEPHRINE PF 0.5-1:200000 % IJ SOLN
INTRAMUSCULAR | Status: DC | PRN
  Administered 2012-11-26: 20 mL

## 2012-11-26 MED ORDER — LACTATED RINGERS IV SOLN
INTRAVENOUS | Status: DC | PRN
  Administered 2012-11-26: 07:00:00 via INTRAVENOUS

## 2012-11-26 MED ORDER — ONDANSETRON HCL 4 MG PO TABS
4.0000 mg | ORAL_TABLET | Freq: Four times a day (QID) | ORAL | Status: DC | PRN
  Administered 2012-11-27: 4 mg via ORAL
  Filled 2012-11-26: qty 1

## 2012-11-26 SURGICAL SUPPLY — 67 items
APL SKNCLS STERI-STRIP NONHPOA (GAUZE/BANDAGES/DRESSINGS) ×1
BANDAGE ELASTIC 4 VELCRO ST LF (GAUZE/BANDAGES/DRESSINGS) ×2 IMPLANT
BANDAGE ESMARK 6X9 LF (GAUZE/BANDAGES/DRESSINGS) ×1 IMPLANT
BANDAGE GAUZE ELAST BULKY 4 IN (GAUZE/BANDAGES/DRESSINGS) ×4 IMPLANT
BENZOIN TINCTURE PRP APPL 2/3 (GAUZE/BANDAGES/DRESSINGS) ×1 IMPLANT
BLADE SAGITTAL 25.0X1.19X90 (BLADE) ×2 IMPLANT
BLADE SURG ROTATE 9660 (MISCELLANEOUS) IMPLANT
BNDG CMPR 9X6 STRL LF SNTH (GAUZE/BANDAGES/DRESSINGS) ×1
BNDG CMPR MED 10X6 ELC LF (GAUZE/BANDAGES/DRESSINGS) ×1
BNDG ELASTIC 6X10 VLCR STRL LF (GAUZE/BANDAGES/DRESSINGS) ×2 IMPLANT
BNDG ESMARK 6X9 LF (GAUZE/BANDAGES/DRESSINGS) ×2
BOWL SMART MIX CTS (DISPOSABLE) ×2 IMPLANT
CEMENT HV SMART SET (Cement) ×4 IMPLANT
CLOTH BEACON ORANGE TIMEOUT ST (SAFETY) ×2 IMPLANT
CLSR STERI-STRIP ANTIMIC 1/2X4 (GAUZE/BANDAGES/DRESSINGS) ×1 IMPLANT
COVER SURGICAL LIGHT HANDLE (MISCELLANEOUS) ×2 IMPLANT
CUFF TOURNIQUET SINGLE 34IN LL (TOURNIQUET CUFF) ×2 IMPLANT
CUFF TOURNIQUET SINGLE 44IN (TOURNIQUET CUFF) IMPLANT
DRAPE EXTREMITY T 121X128X90 (DRAPE) ×2 IMPLANT
DRAPE PROXIMA HALF (DRAPES) ×2 IMPLANT
DRAPE U-SHAPE 47X51 STRL (DRAPES) ×2 IMPLANT
DRSG ADAPTIC 3X8 NADH LF (GAUZE/BANDAGES/DRESSINGS) ×2 IMPLANT
DRSG PAD ABDOMINAL 8X10 ST (GAUZE/BANDAGES/DRESSINGS) ×2 IMPLANT
DURAPREP 26ML APPLICATOR (WOUND CARE) ×2 IMPLANT
ELECT REM PT RETURN 9FT ADLT (ELECTROSURGICAL) ×2
ELECTRODE REM PT RTRN 9FT ADLT (ELECTROSURGICAL) ×1 IMPLANT
FACESHIELD LNG OPTICON STERILE (SAFETY) ×4 IMPLANT
GLOVE BIO SURGEON STRL SZ8.5 (GLOVE) ×2 IMPLANT
GLOVE BIOGEL PI IND STRL 7.0 (GLOVE) IMPLANT
GLOVE BIOGEL PI IND STRL 8 (GLOVE) ×1 IMPLANT
GLOVE BIOGEL PI IND STRL 8.5 (GLOVE) ×1 IMPLANT
GLOVE BIOGEL PI INDICATOR 7.0 (GLOVE) ×1
GLOVE BIOGEL PI INDICATOR 8 (GLOVE) ×1
GLOVE BIOGEL PI INDICATOR 8.5 (GLOVE) ×1
GLOVE SS BIOGEL STRL SZ 8 (GLOVE) ×1 IMPLANT
GLOVE SUPERSENSE BIOGEL SZ 8 (GLOVE) ×1
GLOVE SURG SS PI 6.5 STRL IVOR (GLOVE) ×1 IMPLANT
GOWN PREVENTION PLUS XLARGE (GOWN DISPOSABLE) ×3 IMPLANT
GOWN PREVENTION PLUS XXLARGE (GOWN DISPOSABLE) ×2 IMPLANT
GOWN STRL NON-REIN LRG LVL3 (GOWN DISPOSABLE) ×2 IMPLANT
HANDPIECE INTERPULSE COAX TIP (DISPOSABLE) ×2
HOOD PEEL AWAY FACE SHEILD DIS (HOOD) ×2 IMPLANT
IMMOBILIZER KNEE 20 (SOFTGOODS)
IMMOBILIZER KNEE 20 THIGH 36 (SOFTGOODS) IMPLANT
IMMOBILIZER KNEE 22 UNIV (SOFTGOODS) ×2 IMPLANT
IMMOBILIZER KNEE 24 THIGH 36 (MISCELLANEOUS) IMPLANT
IMMOBILIZER KNEE 24 UNIV (MISCELLANEOUS)
KIT BASIN OR (CUSTOM PROCEDURE TRAY) ×2 IMPLANT
KIT ROOM TURNOVER OR (KITS) ×2 IMPLANT
MANIFOLD NEPTUNE II (INSTRUMENTS) ×2 IMPLANT
NS IRRIG 1000ML POUR BTL (IV SOLUTION) ×2 IMPLANT
PACK TOTAL JOINT (CUSTOM PROCEDURE TRAY) ×2 IMPLANT
PAD ARMBOARD 7.5X6 YLW CONV (MISCELLANEOUS) ×4 IMPLANT
SET HNDPC FAN SPRY TIP SCT (DISPOSABLE) ×1 IMPLANT
SPONGE GAUZE 4X4 12PLY (GAUZE/BANDAGES/DRESSINGS) ×2 IMPLANT
STAPLER VISISTAT 35W (STAPLE) ×1 IMPLANT
SUCTION FRAZIER TIP 10 FR DISP (SUCTIONS) ×1 IMPLANT
SUT MNCRL AB 3-0 PS2 18 (SUTURE) ×1 IMPLANT
SUT VIC AB 0 CT1 27 (SUTURE) ×4
SUT VIC AB 0 CT1 27XBRD ANBCTR (SUTURE) ×2 IMPLANT
SUT VIC AB 2-0 CT1 27 (SUTURE) ×4
SUT VIC AB 2-0 CT1 TAPERPNT 27 (SUTURE) ×2 IMPLANT
SUT VLOC 180 0 24IN GS25 (SUTURE) ×2 IMPLANT
TOWEL OR 17X24 6PK STRL BLUE (TOWEL DISPOSABLE) ×2 IMPLANT
TOWEL OR 17X26 10 PK STRL BLUE (TOWEL DISPOSABLE) ×2 IMPLANT
TRAY FOLEY CATH 14FR (SET/KITS/TRAYS/PACK) ×2 IMPLANT
WATER STERILE IRR 1000ML POUR (IV SOLUTION) ×4 IMPLANT

## 2012-11-26 NOTE — Transfer of Care (Signed)
Immediate Anesthesia Transfer of Care Note  Patient: Traci [name]  Procedure(s) Performed: Procedure(s): TOTAL KNEE ARTHROPLASTY- right  (Right)  Patient Location: PACU  Anesthesia Type:GA combined with regional for post-op pain  Level of Consciousness: awake, oriented and patient cooperative  Airway & Oxygen Therapy: Patient Spontanous Breathing and Patient connected to nasal cannula oxygen  Post-op Assessment: Report given to PACU RN and Post -op Vital signs reviewed and stable  Post vital signs: Reviewed and stable  Complications: No apparent anesthesia complications

## 2012-11-26 NOTE — Anesthesia Preprocedure Evaluation (Signed)
Anesthesia Evaluation  Patient identified by MRN, date of birth, ID band Patient awake    Reviewed: Allergy & Precautions, H&P , NPO status , Patient's Chart, lab work & pertinent test results  Airway Mallampati: I TM Distance: >3 FB Neck ROM: full    Dental   Pulmonary          Cardiovascular Rhythm:regular Rate:Normal     Neuro/Psych  Headaches, PSYCHIATRIC DISORDERS Depression CVA    GI/Hepatic   Endo/Other  Hypothyroidism   Renal/GU      Musculoskeletal   Abdominal   Peds  Hematology   Anesthesia Other Findings   Reproductive/Obstetrics                           Anesthesia Physical Anesthesia Plan  ASA: II  Anesthesia Plan: General   Post-op Pain Management:    Induction: Intravenous  Airway Management Planned: Oral ETT  Additional Equipment:   Intra-op Plan:   Post-operative Plan: Extubation in OR  Informed Consent: I have reviewed the patients History and Physical, chart, labs and discussed the procedure including the risks, benefits and alternatives for the proposed anesthesia with the patient or authorized representative who has indicated his/her understanding and acceptance.     Plan Discussed with: CRNA, Anesthesiologist and Surgeon  Anesthesia Plan Comments:         Anesthesia Quick Evaluation

## 2012-11-26 NOTE — Interval H&P Note (Signed)
History and Physical Interval Note:  11/26/2012 7:23 AM  Traci [name]  has presented today for surgery, with the diagnosis of RIGHT KNEE DEGENERATIVE JOINT DISEASE  The various methods of treatment have been discussed with the patient and family. After consideration of risks, benefits and other options for treatment, the patient has consented to  Procedure(s): TOTAL KNEE ARTHROPLASTY (Right) as a surgical intervention .  The patient's history has been reviewed, patient examined, no change in status, stable for surgery.  I have reviewed the patient's chart and labs.  Questions were answered to the patient's satisfaction.     [name]

## 2012-11-26 NOTE — Anesthesia Postprocedure Evaluation (Signed)
  Anesthesia Post-op Note  Patient: Traci [name]  Procedure(s) Performed: Procedure(s): TOTAL KNEE ARTHROPLASTY- right  (Right)  Patient Location: PACU  Anesthesia Type:GA combined with regional for post-op pain  Level of Consciousness: awake, oriented and patient cooperative  Airway and Oxygen Therapy: Patient Spontanous Breathing  Post-op Pain: mild  Post-op Assessment: Post-op Vital signs reviewed, Patient's Cardiovascular Status Stable, Respiratory Function Stable, Patent Airway, No signs of Nausea or vomiting and Pain level controlled  Post-op Vital Signs: stable  Complications: No apparent anesthesia complications

## 2012-11-26 NOTE — Anesthesia Procedure Notes (Signed)
Anesthesia Regional Block:  Femoral nerve block  Pre-Anesthetic Checklist: ,, timeout performed, Correct Patient, Correct Site, Correct Laterality, Correct Procedure, Correct Position, site marked, Risks and benefits discussed,  Surgical consent,  Pre-op evaluation,  At surgeon's request and post-op pain management  Laterality: Right  Prep: Maximum Sterile Barrier Precautions used, chloraprep and alcohol swabs       Needles:  Injection technique: Single-shot  Needle Type: Stimulator Needle - 80        Needle insertion depth: 7 cm   Additional Needles:  Procedures: nerve stimulator Femoral nerve block  Nerve Stimulator or Paresthesia:  Response: 0.5 mA, 0.1 ms, 7 cm  Additional Responses:   Narrative:  Start time: 11/26/2012 7:00 AM End time: 11/26/2012 7:05 AM Injection made incrementally with aspirations every 5 mL.  Performed by: Personally  Anesthesiologist: Maren Beach MD  Additional Notes: Pt accepts procedure and risks. 20cc 0.5% Marcaine w/ epi w/o difficulty or discomfort. GES

## 2012-11-26 NOTE — Op Note (Signed)
PREOP DIAGNOSIS: DJD RIGHT KNEE POSTOP DIAGNOSIS: DJD RIGHT KNEE PROCEDURE: RIGHT TKR ANESTHESIA: General and block ATTENDING SURGEON: Bertran Zeimet [name] ASSISTANT: Lindwood Qua PA  INDICATIONS FOR PROCEDURE: Traci [name] is a 57 y.o. female who has struggled for a long time with pain due to degenerative arthritis of the right knee.  The patient has failed many conservative non-operative measures and at this point has pain which limits the ability to sleep and walk.  The patient is offered total knee replacement.  Informed operative consent was obtained after discussion of possible risks of anesthesia, infection, neurovascular injury, DVT, and death.  The importance of the post-operative rehabilitation protocol to optimize result was stressed extensively with the patient.  SUMMARY OF FINDINGS AND PROCEDURE:  Traci [name] was taken to the operative suite where under the above anesthesia a right knee replacement was performed.  There were advanced degenerative changes and the bone quality was good.  We used the DePuy system and placed size standard plus femur, 4 tibia, 38 mm all polyethylene patella, and a size 10 mm spacer.  The patient was admitted for appropriate post-op care to include perioperative antibiotics and mechanical and pharmacologic measures for DVT prophylaxis.  DESCRIPTION OF PROCEDURE:  Traci [name] was taken to the operative suite where the above anesthesia was applied.  The patient was positioned supine and prepped and draped in normal sterile fashion.  An appropriate time out was performed.  After the administration of Kefzol pre-op antibiotic the leg was elevated and exsanguinated and a tourniquet inflated. A standard longitudinal incision was made on the anterior knee.  Dissection was carried down to the extensor mechanism.  All appropriate anti-infective measures were used including the pre-operative antibiotic, betadine impregnated drape, and closed hooded  exhaust systems for each member of the surgical team.  A medial parapatellar incision was made in the extensor mechanism and the knee cap flipped and the knee flexed.  Some residual meniscal tissues were removed along with any remaining ACL/PCL tissue.  A guide was placed on the tibia and a flat cut was made on it's superior surface.  An intramedullary guide was placed in the femur and was utilized to make anterior and posterior cuts creating an appropriate flexion gap.  A second intramedullary guide was placed in the femur to make a distal cut properly balancing the knee with an extension gap equal to the flexion gap.  The three bones sized to the above mentioned sizes and the appropriate guides were placed and utilized.  A trial reduction was done and the knee easily came to full extension and the patella tracked well on flexion.  The trial components were removed and all bones were cleaned with pulsatile lavage and then dried thoroughly.  Cement was mixed and was pressurized onto the bones followed by placement of the aforementioned components.  Excess cement was trimmed and pressure was held on the components until the cement had hardened.  The tourniquet was deflated and a small amount of bleeding was controlled with cautery and pressure.  The knee was irrigated thoroughly.  The extensor mechanism was re-approximated with V-loc suture in running fashion.  The knee was flexed and the repair was solid.  The subcutaneous tissues were re-approximated with #0 and #2-0 vicryl and the skin closed with a subcuticular stitch and steristrips.  A sterile dressing was applied.  Intraoperative fluids, EBL, and tourniquet time can be obtained from anesthesia records.  DISPOSITION:  The patient was taken to  recovery room in stable condition and admitted for appropriate post-op care to include peri-operative antibiotic and DVT prophylaxis with mechanical and pharmacologic measures.  Traci [name] 11/26/2012, 9:18 AM

## 2012-11-26 NOTE — Progress Notes (Signed)
Orthopedic Tech Progress Note Patient Details:  Traci [name] 08/05/1956 [id number] CPM applied to Right LE with appropriate settings. OHF not available at this time.  CPM Right Knee CPM Right Knee: On Right Knee Flexion (Degrees): 60 Right Knee Extension (Degrees): 0   [name] 11/26/2012, 10:24 AM

## 2012-11-26 NOTE — Progress Notes (Signed)
Earlier this evening the patient experienced a panic attack once she saw PT bring the walker in the room. She began to hypoventilate and gag. Her vitals remained normal at Bp 127/76 and HR 91 and O2 100%. Patient denies taking anything at home for anxiety. Even though she rates her pain at severe, she is asleep upon entering the room. Will continue to monitor and administer ordered pain medicine.

## 2012-11-26 NOTE — Progress Notes (Signed)
PT Cancellation Note  Patient Details Name: Kyrielle Urbanski MRN: [id number] DOB: 26-Apr-1956   Cancelled Treatment:    Reason Eval/Treat Not Completed: Other (comment) (Pt had anxiety attack.  )    Alferd Apa 11/26/2012, 5:35 PM Verley Pariseau L. Garik Diamant DPT [phone]

## 2012-11-27 LAB — CBC
HCT: 35.4 % — ABNORMAL LOW (ref 36.0–46.0)
Hemoglobin: 11.5 g/dL — ABNORMAL LOW (ref 12.0–15.0)
MCH: 30.1 pg (ref 26.0–34.0)
MCHC: 32.5 g/dL (ref 30.0–36.0)
MCV: 92.7 fL (ref 78.0–100.0)
Platelets: 228 10*3/uL (ref 150–400)
RBC: 3.82 MIL/uL — ABNORMAL LOW (ref 3.87–5.11)
RDW: 14.2 % (ref 11.5–15.5)
WBC: 13.8 10*3/uL — ABNORMAL HIGH (ref 4.0–10.5)

## 2012-11-27 LAB — BASIC METABOLIC PANEL
BUN: 14 mg/dL (ref 6–23)
CO2: 30 mEq/L (ref 19–32)
Calcium: 8.4 mg/dL (ref 8.4–10.5)
Chloride: 100 mEq/L (ref 96–112)
Creatinine, Ser: 0.83 mg/dL (ref 0.50–1.10)
GFR calc Af Amer: 88 mL/min — ABNORMAL LOW (ref >90)
GFR calc non Af Amer: 76 mL/min — ABNORMAL LOW (ref >90)
Glucose, Bld: 139 mg/dL — ABNORMAL HIGH (ref 70–99)
Potassium: 4.7 mEq/L (ref 3.5–5.1)
Sodium: 136 mEq/L (ref 135–145)

## 2012-11-27 NOTE — Evaluation (Signed)
Physical Therapy Evaluation Patient Details Name: Traci [name] MRN: [id number] DOB: February 03, 1956 Today's Date: 11/27/2012 Time: 4098-1191 PT Time Calculation (min): 49 min  PT Assessment / Plan / Recommendation Clinical Impression  Ms. Traci [name] is a 57 y/o female s/p R TKA.  Pt does not have assistance available at home as her roomate is in poor health and unable to assist her.  Pt willl benefit from short term SNF placement for continued therapy to  maximize safety and independence with mobilty.     PT Assessment  Patient needs continued PT services    Follow Up Recommendations  SNF;Supervision/Assistance - 24 hour    Does the patient have the potential to tolerate intense rehabilitation      Barriers to Discharge Decreased caregiver support      Equipment Recommendations  None recommended by PT    Recommendations for Other Services     Frequency Min 6X/week    Precautions / Restrictions Precautions Precautions: Fall Required Braces or Orthoses: Knee Immobilizer - Right Knee Immobilizer - Right: On when out of bed or walking Restrictions Weight Bearing Restrictions: Yes RLE Weight Bearing: Weight bearing as tolerated   Pertinent Vitals/Pain 10 /10 pin in knee at rest and with activity. Pt able to continue with session.  Pt had been medicated approximately 1 hour prior to session.  Notified RN of pt pain.       Mobility  Bed Mobility Bed Mobility: Supine to Sit;Sitting - Scoot to Edge of Bed Supine to Sit: 2: Max assist;HOB flat Sitting - Scoot to [employer] of Bed: 2: Max assist Details for Bed Mobility Assistance: Step by step cues for technique assist for RLE assist to raise shouders from bed.   Transfers Transfers: Sit to Stand;Stand to [employer] Transfers Sit to Stand: 2: Max assist;From bed;With upper extremity assist (2 trials) Stand to Sit: 2: Max assist;To bed;To chair/3-in-1 Stand Pivot Transfers: 2: Max assist Details for Transfer Assistance: Step  by step cueing (verbal and tactile) for safe technique including hand placement and RLE positioning.  Manual facilitation to manage/position RLE secondary to pt fear of moving the leg.  Manual facilitation to steady pt when advancing LLE secondary to pain/weakness in RLE.  Ambulation/Gait Ambulation/Gait Assistance: Not tested (comment) Wheelchair Mobility Wheelchair Mobility: No    Exercises Total Joint Exercises Ankle Circles/Pumps: Both;10 reps Quad Sets: Right;5 reps Goniometric ROM: 0-45 degrees AAROM    PT Diagnosis: Difficulty walking;Generalized weakness;Acute pain  PT Problem List: Decreased strength;Decreased range of motion;Decreased activity tolerance;Decreased mobility;Decreased knowledge of use of DME;Other (comment) PT Treatment Interventions: DME instruction;Gait training;Functional mobility training;Therapeutic activities;Therapeutic exercise;Patient/family education   PT Goals Acute Rehab PT Goals PT Goal Formulation: With patient Time For Goal Achievement: 12/11/12 Potential to Achieve Goals: Good Pt will go Supine/Side to Sit: with supervision PT Goal: Supine/Side to Sit - Progress: Goal set today Pt will go Sit to Supine/Side: with supervision PT Goal: Sit to Supine/Side - Progress: Goal set today Pt will go Sit to Stand: with supervision PT Goal: Sit to Stand - Progress: Goal set today Pt will go Stand to Sit: with supervision PT Goal: Stand to Sit - Progress: Goal set today Pt will Transfer Bed to Chair/Chair to Bed: with supervision PT Transfer Goal: Bed to Chair/Chair to Bed - Progress: Goal set today Pt will Ambulate: 51 - 150 feet;with supervision;with rolling walker PT Goal: Ambulate - Progress: Goal set today  Visit Information  Last PT Received On: 11/27/12 Assistance Needed: +2  Subjective Data  Subjective: I cant do this (repeated several times) Patient Stated Goal: be able to wallk   Prior Functioning  Home Living Lives With: Significant  other Available Help at Discharge: Skilled Nursing Facility Prior Function Level of Independence: Independent Able to Take Stairs?: Yes Driving: No Vocation: Unemployed Communication Communication: No difficulties    Cognition  Cognition Overall Cognitive Status: Appears within functional limits for tasks assessed/performed Arousal/Alertness: Lethargic Orientation Level: Oriented X4 / Intact Behavior During Session: [facility] for tasks performed    Extremity/Trunk Assessment Right Lower Extremity Assessment RLE ROM/Strength/Tone: Unable to fully assess;Due to pain Left Lower Extremity Assessment LLE ROM/Strength/Tone: WFL for tasks assessed   Balance Balance Balance Assessed: Yes Static Sitting Balance Static Sitting - Balance Support: Feet supported;Left upper extremity supported;Right upper extremity supported Static Sitting - Level of Assistance: 5: Stand by assistance Static Sitting - Comment/# of Minutes: Pt sat at EOB 10+ minutes with c/o nausea and dizziness.   End of Session PT - End of Session Equipment Utilized During Treatment: Gait belt;Right knee immobilizer;Oxygen Activity Tolerance: Patient tolerated treatment well;Other (comment) (Pt limited by anxiety requires extensive time for each task.) Patient left: in chair;with call bell/phone within reach Nurse Communication: Mobility status;Weight bearing status CPM Right Knee CPM Right Knee: Off  GP     Crystallynn Noorani 11/27/2012, 12:56 PM Ronnisha Felber L. Sharah Finnell DPT [phone]

## 2012-11-27 NOTE — Care Management Note (Signed)
CARE MANAGEMENT NOTE 11/27/2012  Patient:  Traci [name], Traci [name]   Account Number:  [id number]  Date Initiated:  11/27/2012  Documentation initiated by:  Vance Peper  Subjective/Objective Assessment:   57 yr old female s/p right total knee arthroplasty.     Action/Plan:   Patient is for shortterm rehab, will be going to [facility]. Social Worker is aware.   Anticipated DC Date:  11/28/2012   Anticipated DC Plan:  SKILLED NURSING FACILITY  In-house referral  Clinical Social Worker      DC Planning Services  CM consult      Choice offered to / List presented to:             Status of service:  Completed, signed off Medicare Important Message given?   (If response is "NO", the following Medicare IM given date fields will be blank) Date Medicare IM given:   Date Additional Medicare IM given:    Discharge Disposition:  SKILLED NURSING FACILITY  Per UR Regulation:    If discussed at Long Length of Stay Meetings, dates discussed:    Comments:

## 2012-11-27 NOTE — Progress Notes (Signed)
Subjective: 1 Day Post-Op Procedure(s) (LRB): TOTAL KNEE ARTHROPLASTY- right  (Right) Doing better Activity level:  wbat Diet tolerance:  ok Voiding:  ok Patient reports pain as 3 on 0-10 scale.    Objective: Vital signs in last 24 hours: Temp:  [97.4 F (36.3 C)-98.3 F (36.8 C)] 97.5 F (36.4 C) (03/26 0551) Pulse Rate:  [76-102] 102 (03/26 0551) Resp:  [11-21] 18 (03/26 0551) BP: (96-128)/(60-85) 121/72 mmHg (03/26 0551) SpO2:  [89 %-100 %] 95 % (03/26 0551)  Labs:  Recent Labs  11/27/12 0547  HGB 11.5*    Recent Labs  11/27/12 0547  WBC 13.8*  RBC 3.82*  HCT 35.4*  PLT 228    Recent Labs  11/27/12 0547  NA 136  K 4.7  CL 100  CO2 30  BUN 14  CREATININE 0.83  GLUCOSE 139*  CALCIUM 8.4   No results found for this basename: LABPT, INR,  in the last 72 hours  Physical Exam:  Neurologically intact ABD soft Neurovascular intact Sensation intact distally Intact pulses distally Dorsiflexion/Plantar flexion intact Incision: dressing C/D/I No cellulitis present Compartment soft  Assessment/Plan:  1 Day Post-Op Procedure(s) (LRB): TOTAL KNEE ARTHROPLASTY- right  (Right) Advance diet Up with therapy D/C IV fluids Discharge to SNF thur or fri when bed ready D/C IV ASA 325  BID x 2 weeks and scd while here    Traci [name] 11/27/2012, 8:17 AM

## 2012-11-27 NOTE — Clinical Social Work Placement (Addendum)
Clinical Social Work Department  CLINICAL SOCIAL WORK PLACEMENT NOTE  11/27/2012 Patient:Traci [name]  Account Number:  [id number]   Admit date: 11/26/12 Clinical Social Worker: Sabino Niemann MSW Date/time: 11/27/2012 2:30 AM  Clinical Social Work is seeking post-discharge placement for this patient at the following level of care: SKILLED NURSING (*CSW will update this form in Epic as items are completed)  11/27/2012 Patient/family provided with [facility] Department of Clinical Social Work's list of facilities offering this level of care within the geographic area requested by the patient (or if unable, by the patient's family).  11/27/2012 Patient/family informed of their freedom to choose among providers that offer the needed level of care, that participate in Medicare, Medicaid or managed care program needed by the patient, have an available bed and are willing to accept the patient.  11/27/2012 Patient/family informed of [facility]' ownership interest in [facility], as well as of the fact that they are under no obligation to receive care at this facility.  PASARR submitted to EDS on  PASARR number received from EDS on  FL2 transmitted to all facilities in geographic area requested by pt/family on 11/27/2012 FL2 transmitted to all facilities within larger geographic area on  Patient informed that his/her managed care company has contracts with or will negotiate with certain facilities, including the following:  Patient/family informed of bed offers received:  Patient chooses bed at [facility] Physician recommends and patient chooses bed at  Patient to be transferred to on 11/29/2012 Patient to be transferred to facility by [facility] The following physician request were entered in Epic:  Additional Comments:   Sabino Niemann, MSW,  [phone]  Signing off

## 2012-11-27 NOTE — Clinical Social Work Psychosocial (Signed)
Clinical Social Work Department  BRIEF PSYCHOSOCIAL ASSESSMENT  Patient: Traci [name]  Account Number: [id number]  Admit date: 11/26/2012 Clinical Social Worker Sabino Niemann, MSW Date/Time: 11/27/2012 1:30PM  Referred by: Physician Date Referred: 11/26/2012 Referred for   SNF Placement   Other Referral:  Interview type: Patient  Other interview type: PSYCHOSOCIAL DATA  Living Status: Patient lives with a roomate Admitted from facility:  Level of care:  Primary support name: Traci [name] Primary support relationship to patient: Sister Degree of support available:  Strong and vested  CURRENT CONCERNS  Current Concerns   Post-Acute Placement   Other Concerns:  SOCIAL WORK ASSESSMENT / PLAN  CSW met with pt re: PT recommendation for SNF.   Pt lives with a roomate  CSW explained placement process and answered questions.   Pt reports [facility]  as her preference    CSW completed FL2 and sent clincials to [facility]     Assessment/plan status: Information/Referral to [facility]  Other assessment/ plan:  Information/referral to community resources:  SNF   PTAR  PATIENT'S/FAMILY'S RESPONSE TO PLAN OF CARE:  Pt  reports she is agreeable to ST SNF in order to increase strength and independence with mobility prior to returning home  Pt verbalized understanding of placement process and appreciation for CSW assist.   Sabino Niemann, MSW [phone]

## 2012-11-27 NOTE — Progress Notes (Signed)
Patient refused dressing change this morning per ordered.  Patient agreed to let me change dressing after lunch.  She is OOB to chair at this time.

## 2012-11-27 NOTE — Progress Notes (Signed)
Patient's dressing was finally changed this afternoon.  Delay was d/t pt's pain level when working with RLE ex; ambulation from chair to bed.  Immobilizer applied with ice after pt was put back to bed.  Voided with fx bedpan.  Patient has been very sleepy this shift and has ate <25% for each meal.  Pt pulled IV out, new one administered, will keep her KVO with IV fluids.  Will continue to monitor for status changes.

## 2012-11-27 NOTE — Progress Notes (Signed)
OT Cancellation Note  Patient Details Name: Traci [name] MRN: [id number] DOB: 1956-04-17   Cancelled Treatment:    Reason Eval/Treat Not Completed: Other (comment) Pt to D/C to SNF for rehab. Will defer OT to SNF. If evel needed, or D/C plan changes to home, please reorder.  [facility] Kadejah Sandiford, OTR/L  [phone] 11/27/2012 11/27/2012, 12:04 PM

## 2012-11-27 NOTE — Progress Notes (Signed)
UR COMPLETED  

## 2012-11-28 ENCOUNTER — Encounter (HOSPITAL_COMMUNITY): Payer: Self-pay | Admitting: Orthopaedic Surgery

## 2012-11-28 LAB — CBC
HCT: 30.8 % — ABNORMAL LOW (ref 36.0–46.0)
Hemoglobin: 10.2 g/dL — ABNORMAL LOW (ref 12.0–15.0)
MCH: 29.9 pg (ref 26.0–34.0)
MCHC: 33.1 g/dL (ref 30.0–36.0)
MCV: 90.3 fL (ref 78.0–100.0)
Platelets: 207 10*3/uL (ref 150–400)
RBC: 3.41 MIL/uL — ABNORMAL LOW (ref 3.87–5.11)
RDW: 14.1 % (ref 11.5–15.5)
WBC: 14.2 10*3/uL — ABNORMAL HIGH (ref 4.0–10.5)

## 2012-11-28 MED ORDER — METHOCARBAMOL 500 MG PO TABS: 500.0000 mg | ORAL_TABLET | Freq: Four times a day (QID) | ORAL | Status: DC | PRN

## 2012-11-28 MED ORDER — ASPIRIN 325 MG PO TBEC: 325.0000 mg | DELAYED_RELEASE_TABLET | Freq: Two times a day (BID) | ORAL | Status: DC

## 2012-11-28 MED ORDER — OXYCODONE HCL 5 MG PO TABS: 5.0000 mg | ORAL_TABLET | ORAL | Status: DC | PRN

## 2012-11-28 NOTE — Progress Notes (Signed)
Physical Therapy Treatment Patient Details Name: Traci [name] MRN: [id number] DOB: June 19, 1956 Today's Date: 11/28/2012 Time: 4098-1191 PT Time Calculation (min): 45 min  PT Assessment / Plan / Recommendation Comments on Treatment Session  Pt required max encouragement to participate in session.  Pt mobility much improved and gait improved from Max to Mod assist from first to second trial.  Will continue to progress mobility     Follow Up Recommendations  SNF;Supervision/Assistance - 24 hour     Does the patient have the potential to tolerate intense rehabilitation     Barriers to Discharge        Equipment Recommendations  None recommended by PT    Recommendations for Other Services    Frequency Min 6X/week   Plan Discharge plan remains appropriate;Frequency remains appropriate    Precautions / Restrictions Precautions Precautions: Fall Required Braces or Orthoses: Knee Immobilizer - Right Knee Immobilizer - Right: On when out of bed or walking Restrictions Weight Bearing Restrictions: Yes RLE Weight Bearing: Weight bearing as tolerated   Pertinent Vitals/Pain 8/10 pain in R knee. Medicated prior to session.     Mobility  Bed Mobility Bed Mobility: Not assessed Transfers Transfers: Sit to Stand;Stand to Sit;Stand Pivot Transfers Sit to Stand: 3: Mod assist;From chair/3-in-1;From elevated surface Stand to Sit: 3: Mod assist;To chair/3-in-1;With upper extremity assist Stand Pivot Transfers: 3: Mod assist Details for Transfer Assistance: VCs for technique.  Assist to shift wt forward and steady pt .   Ambulation/Gait Ambulation/Gait Assistance: 3: Mod assist Ambulation Distance (Feet): 15 Feet (x 2) Assistive device: Rolling walker Ambulation/Gait Assistance Details: Assist to support pt body wt for pt to advance LLE.  VCs for gait sequencing, assist to manage RW, Repeated verbal and tactile cueing to extend trunk/hips Gait Pattern: Step-to pattern;Decreased  weight shift to right Stairs: No    Exercises     PT Diagnosis:    PT Problem List:   PT Treatment Interventions:     PT Goals Acute Rehab PT Goals PT Goal Formulation: With patient Time For Goal Achievement: 12/11/12 Potential to Achieve Goals: Good Pt will go Supine/Side to Sit: with supervision PT Goal: Supine/Side to Sit - Progress: Progressing toward goal Pt will go Sit to Supine/Side: with supervision Pt will go Sit to Stand: with supervision PT Goal: Sit to Stand - Progress: Progressing toward goal Pt will go Stand to Sit: with supervision PT Goal: Stand to Sit - Progress: Progressing toward goal Pt will Transfer Bed to Chair/Chair to Bed: with supervision PT Transfer Goal: Bed to Chair/Chair to Bed - Progress: Progressing toward goal Pt will Ambulate: 51 - 150 feet;with supervision;with rolling walker PT Goal: Ambulate - Progress: Progressing toward goal  Visit Information  Last PT Received On: 11/28/12 Assistance Needed: +2    Subjective Data  Subjective: I cant do it Patient Stated Goal: be able to wallk   Cognition  Cognition Overall Cognitive Status: Appears within functional limits for tasks assessed/performed Arousal/Alertness: Lethargic Orientation Level: Oriented X4 / Intact Behavior During Session: [facility] for tasks performed    Balance  Balance Balance Assessed: No  End of Session PT - End of Session Equipment Utilized During Treatment: Gait belt;Right knee immobilizer;Oxygen Activity Tolerance: Patient tolerated treatment well;Other (comment) Patient left: in chair;with call bell/phone within reach Nurse Communication: Mobility status;Weight bearing status   GP     Tammie Yanda 11/28/2012, 3:03 PM Larua Collier L. Stonewall Doss DPT [phone]

## 2012-11-28 NOTE — Progress Notes (Signed)
Subjective: 2 Days Post-Op Procedure(s) (LRB): TOTAL KNEE ARTHROPLASTY- right  (Right) She is complaining of knee pain. Activity level:  Weightbearing as tolerated. Will go to skilled nursing facility Friday Diet tolerance:  ok with light eating Voiding:  ok Patient reports pain as 7 on 0-10 scale.    Objective: Vital signs in last 24 hours: Temp:  [98.1 F (36.7 C)-99.2 F (37.3 C)] 99.2 F (37.3 C) (03/27 0529) Pulse Rate:  [92-100] 100 (03/27 0529) Resp:  [16-17] 16 (03/27 0529) BP: (122-132)/(66-79) 132/74 mmHg (03/27 1300) SpO2:  [93 %-96 %] 93 % (03/27 0529)  Labs:  Recent Labs  11/27/12 0547 11/28/12 0500  HGB 11.5* 10.2*    Recent Labs  11/27/12 0547 11/28/12 0500  WBC 13.8* 14.2*  RBC 3.82* 3.41*  HCT 35.4* 30.8*  PLT 228 207    Recent Labs  11/27/12 0547  NA 136  K 4.7  CL 100  CO2 30  BUN 14  CREATININE 0.83  GLUCOSE 139*  CALCIUM 8.4   No results found for this basename: LABPT, INR,  in the last 72 hours  Physical Exam:  Neurologically intact ABD soft Neurovascular intact Sensation intact distally Intact pulses distally Dorsiflexion/Plantar flexion intact Incision: dressing C/D/I No cellulitis present Compartment soft  Assessment/Plan:  2 Days Post-Op Procedure(s) (LRB): TOTAL KNEE ARTHROPLASTY- right  (Right) Advance diet Up with therapy D/C IV fluids Discharge to SNF anticipate this on Friday. Continue ASA 3251 pill twice a day for 2 weeks to prevent DVT May be weightbearing as tolerated. Dressing was changed yesterday a.m. leg looks within normal limits    Traci [name] R 11/28/2012, 1:18 PM

## 2012-11-28 NOTE — Discharge Summary (Signed)
Patient ID: Traci [name] MRN: [id number] DOB/AGE: [birth year] 57 y.o.  Admit date: 11/26/2012 Discharge date: 11/29/2012  Admission Diagnoses:  Principal Problem:   Right knee DJD   Discharge Diagnoses:  Same  Past Medical History  Diagnosis Date  . Hyperlipidemia   . Depression     Previously responded to Wellbutrin  . Headache   . Migraines   . Stroke 12/2000  . Thyroid disease     Hypothyroidism  . Abnormal alkaline phosphatase test     with neg eval prev  . Hypothyroidism     Surgeries: Procedure(s): TOTAL KNEE ARTHROPLASTY- right  on 11/26/2012   Consultants:    Discharged Condition: Improved  Hospital Course: Shanell Aden is an 57 y.o. female who was admitted 11/26/2012 for operative treatment ofRight knee DJD. Patient has severe unremitting pain that affects sleep, daily activities, and work/hobbies. After pre-op clearance the patient was taken to the operating room on 11/26/2012 and underwent  Procedure(s): TOTAL KNEE ARTHROPLASTY- right .    Patient was given perioperative antibiotics: Anti-infectives   Start     Dose/Rate Route Frequency Ordered Stop   11/26/12 1300  ceFAZolin (ANCEF) IVPB 2 g/50 mL premix     2 g 100 mL/hr over 30 Minutes Intravenous Every 6 hours 11/26/12 1112 11/26/12 1850   11/26/12 0600  ceFAZolin (ANCEF) IVPB 2 g/50 mL premix     2 g 100 mL/hr over 30 Minutes Intravenous On call to O.R. 11/25/12 1413 11/26/12 0733       Patient was given sequential compression devices, early ambulation, and chemoprophylaxis to prevent DVT.  Patient benefited maximally from hospital stay and there were no complications.    Recent vital signs: Patient Vitals for the past 24 hrs:  BP Temp Pulse Resp SpO2  11/28/12 1300 132/74 mmHg - - - -  11/28/12 0529 128/66 mmHg 99.2 F (37.3 C) 100 16 93 %  11/27/12 2218 122/67 mmHg 98.1 F (36.7 C) 96 16 95 %  11/27/12 1600 - - - 16 96 %  11/27/12 1416 127/79 mmHg 98.2 F (36.8 C) 92 17 96 %      Recent laboratory studies:  Recent Labs  11/27/12 0547 11/28/12 0500  WBC 13.8* 14.2*  HGB 11.5* 10.2*  HCT 35.4* 30.8*  PLT 228 207  NA 136  --   K 4.7  --   CL 100  --   CO2 30  --   BUN 14  --   CREATININE 0.83  --   GLUCOSE 139*  --   CALCIUM 8.4  --      Discharge Medications:     Medication List    TAKE these medications       aspirin 325 MG EC tablet  Take 1 tablet (325 mg total) by mouth 2 (two) times daily.     CALCIUM 1000 + D PO  Take 2,000 Units by mouth daily.     fish oil-omega-3 fatty acids 1000 MG capsule  Take 1 g by mouth daily.     levothyroxine 125 MCG tablet  Commonly known as:  SYNTHROID, LEVOTHROID  Take 125 mcg by mouth daily.     methocarbamol 500 MG tablet  Commonly known as:  ROBAXIN  Take 1 tablet (500 mg total) by mouth every 6 (six) hours as needed.     multivitamin tablet  Take 1 tablet by mouth daily.     oxyCODONE 5 MG immediate release tablet  Commonly known as:  Oxy IR/ROXICODONE  Take 1-2 tablets (5-10 mg total) by mouth every 3 (three) hours as needed.       patient can be weightbearing as tolerated. Patient can use a CPM machine 0-60. She can use it for-6 hours per day and advance as tolerated. May change dressing as needed.  Diagnostic Studies: Dg Chest 2 View  11/13/2012  *RADIOLOGY REPORT*  Clinical Data: Preop total knee arthroplasty  CHEST - 2 VIEW  Comparison: 05/06/2007.  Findings: The heart size and mediastinal contours are within normal limits.  Both lungs are clear.  The visualized skeletal structures are unremarkable.  IMPRESSION: Negative exam.   Original Report Authenticated By: Signa Kell, M.D.     Disposition:       Discharge Orders   Future Orders Complete By Expires     Call MD / Call 911  As directed     Comments:      If you experience chest pain or shortness of breath, CALL 911 and be transported to the hospital emergency room.  If you develope a fever above 101 F, pus (white drainage)  or increased drainage or redness at the wound, or calf pain, call your surgeon's office.    Constipation Prevention  As directed     Comments:      Drink plenty of fluids.  Prune juice may be helpful.  You may use a stool softener, such as Colace (over the counter) 100 mg twice a day.  Use MiraLax (over the counter) for constipation as needed.    Diet - low sodium heart healthy  As directed     Increase activity slowly as tolerated  As directed        Follow-up Information   Follow up with DALLDORF,PETER G, MD. Call in 2 weeks.   Contact information:   [address] [place] [address] [phone]        Signed: Prince Rome 11/28/2012, 1:24 PM

## 2012-11-29 LAB — GLUCOSE, CAPILLARY: Glucose-Capillary: 142 mg/dL — ABNORMAL HIGH (ref 70–99)

## 2012-11-29 NOTE — Progress Notes (Signed)
Report called to receiving LPN, Gar Ponto at [facility]. All questions answered.

## 2012-11-29 NOTE — Progress Notes (Signed)
Subjective: 3 Days Post-Op Procedure(s) (LRB): TOTAL KNEE ARTHROPLASTY- right  (Right) Feeling better today Activity level:  wbat Diet tolerance:  ok Voiding:  ok Patient reports pain as 4 on 0-10 scale.    Objective: Vital signs in last 24 hours: Temp:  [97.6 F (36.4 C)-98.9 F (37.2 C)] 98.9 F (37.2 C) (03/28 0530) Pulse Rate:  [89-96] 90 (03/28 0530) Resp:  [16-17] 16 (03/28 0530) BP: (122-139)/(74-82) 137/80 mmHg (03/28 0530) SpO2:  [94 %-96 %] 94 % (03/28 0530)  Labs:  Recent Labs  11/27/12 0547 11/28/12 0500  HGB 11.5* 10.2*    Recent Labs  11/27/12 0547 11/28/12 0500  WBC 13.8* 14.2*  RBC 3.82* 3.41*  HCT 35.4* 30.8*  PLT 228 207    Recent Labs  11/27/12 0547  NA 136  K 4.7  CL 100  CO2 30  BUN 14  CREATININE 0.83  GLUCOSE 139*  CALCIUM 8.4   No results found for this basename: LABPT, INR,  in the last 72 hours  Physical Exam:  Neurologically intact ABD soft Neurovascular intact Sensation intact distally Intact pulses distally Dorsiflexion/Plantar flexion intact Incision: dressing C/D/I No cellulitis present Compartment soft  Assessment/Plan:  3 Days Post-Op Procedure(s) (LRB): TOTAL KNEE ARTHROPLASTY- right  (Right) Advance diet Up with therapy D/C IV fluids Discharge to SNF ASA 325 BID x 2 weeks  D/c sum in chart    Amberlee Garvey R 11/29/2012, 7:55 AM

## 2012-11-29 NOTE — Progress Notes (Signed)
Physical Therapy Treatment Patient Details Name: Traci [name] MRN: [id number] DOB: [birth year] Today's Date: 11/29/2012 Time: 1478-2956 PT Time Calculation (min): 36 min  PT Assessment / Plan / Recommendation Comments on Treatment Session  Pt continues to present lethargic and moving very slowly.  Pt continues to be a signifcant falls risk.  Activity tolerance and mobility improving.      Follow Up Recommendations  SNF;Supervision/Assistance - 24 hour     Does the patient have the potential to tolerate intense rehabilitation     Barriers to Discharge        Equipment Recommendations  None recommended by PT    Recommendations for Other Services    Frequency Min 6X/week   Plan Discharge plan remains appropriate;Frequency remains appropriate    Precautions / Restrictions Precautions Precautions: Fall Required Braces or Orthoses: Knee Immobilizer - Right Knee Immobilizer - Right: On when out of bed or walking Restrictions Weight Bearing Restrictions: Yes RLE Weight Bearing: Weight bearing as tolerated   Pertinent Vitals/Pain Pt c/o pain with activity but no pain at rest.  Pt stated "Today is the most comfortable my leg has felt yet!"    Mobility  Bed Mobility Bed Mobility: Not assessed Transfers Transfers: Sit to Stand;Stand to Sit Sit to Stand: 4: Min assist;From chair/3-in-1;With upper extremity assist Stand to Sit: 4: Min assist;To chair/3-in-1;With upper extremity assist Stand Pivot Transfers: Not tested (comment) Details for Transfer Assistance: VCs for technique.  Assist to shift wt forward and steady pt .   Ambulation/Gait Ambulation/Gait Assistance: 4: Min assist Ambulation Distance (Feet): 25 Feet Assistive device: Rolling walker Ambulation/Gait Assistance Details: Pt continues to have difficulty advancing LLE.  Manual facilitation to advance LLE, VCs for posture and to increase gait speed. Pt presents with excessive delay between strides.   Gait Pattern:  Step-to pattern;Decreased weight shift to right;Trunk flexed;Festinating;Decreased hip/knee flexion - left Gait velocity: slow  Stairs: No Wheelchair Mobility Wheelchair Mobility: No    Exercises     PT Diagnosis:    PT Problem List:   PT Treatment Interventions:     PT Goals Acute Rehab PT Goals PT Goal Formulation: With patient Time For Goal Achievement: 12/11/12 Potential to Achieve Goals: Good Pt will go Supine/Side to Sit: with supervision Pt will go Sit to Supine/Side: with supervision Pt will go Sit to Stand: with supervision PT Goal: Sit to Stand - Progress: Progressing toward goal Pt will go Stand to Sit: with supervision PT Goal: Stand to Sit - Progress: Progressing toward goal Pt will Transfer Bed to Chair/Chair to Bed: with supervision PT Transfer Goal: Bed to Chair/Chair to Bed - Progress: Progressing toward goal Pt will Ambulate: 51 - 150 feet;with supervision;with rolling walker PT Goal: Ambulate - Progress: Progressing toward goal  Visit Information  Last PT Received On: 11/29/12    Subjective Data  Subjective: I feel sick today but I will try.   Patient Stated Goal: be able to wallk   Cognition  Cognition Overall Cognitive Status: Appears within functional limits for tasks assessed/performed Arousal/Alertness: Lethargic Orientation Level: Oriented X4 / Intact Behavior During Session: [facility] for tasks performed    Balance  Balance Balance Assessed: No  End of Session PT - End of Session Equipment Utilized During Treatment: Gait belt;Right knee immobilizer;Oxygen Activity Tolerance: Patient limited by fatigue Patient left: in chair;with call bell/phone within reach Nurse Communication: Mobility status;Weight bearing status   GP     Marge Vandermeulen 11/29/2012, 11:24 AM Theron Arista L. Monterius Rolf DPT [phone]

## 2012-12-02 ENCOUNTER — Other Ambulatory Visit: Payer: Self-pay | Admitting: *Deleted

## 2012-12-02 ENCOUNTER — Non-Acute Institutional Stay (SKILLED_NURSING_FACILITY): Payer: 59 | Admitting: Internal Medicine

## 2012-12-02 DIAGNOSIS — M1710 Unilateral primary osteoarthritis, unspecified knee: Secondary | ICD-10-CM

## 2012-12-02 DIAGNOSIS — E039 Hypothyroidism, unspecified: Secondary | ICD-10-CM

## 2012-12-02 DIAGNOSIS — M1711 Unilateral primary osteoarthritis, right knee: Secondary | ICD-10-CM

## 2012-12-02 DIAGNOSIS — IMO0002 Reserved for concepts with insufficient information to code with codable children: Secondary | ICD-10-CM

## 2012-12-02 NOTE — Progress Notes (Signed)
Patient ID: Traci [name], female   DOB: 07-14-1956, 57 y.o.   MRN: [id number]    PCP: Crawford Givens, MD  Code Status: full code  Allergies  Allergen Reactions  . Codeine Itching  . Meperidine Hcl     syncope  . Simvastatin     REACTION: muscle aches    Chief Complaint: new admit post hospitalization 11/26/12- 11/29/12  HPI:  57 y/o female patient is here for STR post right knee arthroplasty for severe right DJD. She was seen in her room today with nursing supervisor. She is in no distress and denies any complaints. Her pain is under control with current regimen. She has occasional muscle spasms. Has been working with PT/OT.   Review of Systems:  Review of Systems  Constitutional: Negative for fever, chills and diaphoresis.  HENT: Negative for congestion.   Eyes: Negative for blurred vision.  Respiratory: Negative for cough.   Cardiovascular: Positive for leg swelling. Negative for chest pain and palpitations.  Gastrointestinal: Negative for heartburn, vomiting, abdominal pain and constipation.  Genitourinary: Negative for dysuria.  Skin: Negative for rash.  Neurological: Positive for weakness. Negative for dizziness and headaches.  Psychiatric/Behavioral: Negative for depression.    Past Medical History  Diagnosis Date  . Hyperlipidemia   . Depression     Previously responded to Wellbutrin  . Headache   . Migraines   . Stroke 12/2000  . Thyroid disease     Hypothyroidism  . Abnormal alkaline phosphatase test     with neg eval prev  . Hypothyroidism    Past Surgical History  Procedure Laterality Date  . Laparoscopic gastric banding  2006  . Cholecystectomy      ? 20 years ago  . Total knee arthroplasty Right 11/26/2012    Procedure: TOTAL KNEE ARTHROPLASTY- right ;  Surgeon: Velna Ochs, MD;  Location: [facility] OR;  Service: Orthopedics;  Laterality: Right;   Social History:   reports that she has never smoked. She has never used smokeless tobacco. She  reports that she does not drink alcohol or use illicit drugs.  Family History  Problem Relation Age of Onset  . Cancer Other     Ovarian, Uterine  . Heart disease Other   . Stroke Mother     Medications: Patient's Medications  New Prescriptions   No medications on file  Previous Medications   ASPIRIN EC 325 MG EC TABLET    Take 1 tablet (325 mg total) by mouth 2 (two) times daily.   CALCIUM CARB-CHOLECALCIFEROL (CALCIUM 1000 + D PO)    Take 2,000 Units by mouth daily.   FISH OIL-OMEGA-3 FATTY ACIDS 1000 MG CAPSULE    Take 1 g by mouth daily.     LEVOTHYROXINE (SYNTHROID, LEVOTHROID) 125 MCG TABLET    Take 125 mcg by mouth daily.   METHOCARBAMOL (ROBAXIN) 500 MG TABLET    Take 1 tablet (500 mg total) by mouth every 6 (six) hours as needed.   MULTIPLE VITAMIN (MULTIVITAMIN) TABLET    Take 1 tablet by mouth daily.    OXYCODONE (OXY IR/ROXICODONE) 5 MG IMMEDIATE RELEASE TABLET    Take 1-2 tablets (5-10 mg total) by mouth every 3 (three) hours as needed.  Modified Medications   No medications on file  Discontinued Medications   No medications on file     Physical Exam: Filed Vitals:   12/02/12 1337  BP: 132/88  Pulse: 84  Temp: 97 F (36.1 C)  Resp: 16  SpO2:  97%   Physical Exam  Constitutional: She is oriented to person, place, and time. No distress.  Overweight, pleasant female patient  HENT:  Head: Normocephalic and atraumatic.  Mouth/Throat: Oropharynx is clear and moist.  Eyes: Conjunctivae are normal. Pupils are equal, round, and reactive to light.  Neck: Normal range of motion. Neck supple.  Cardiovascular: Normal rate and regular rhythm.   Pulmonary/Chest: Effort normal and breath sounds normal.  Abdominal: Soft. Bowel sounds are normal.  Musculoskeletal: Normal range of motion.  Except at right knee  Neurological: She is alert and oriented to person, place, and time. She has normal reflexes.  Skin: Skin is warm and dry. No rash noted. She is not diaphoretic.  No erythema. No pallor.  Has steristrips in place and dressing dry and clean on right leg. Mild edema in right leg. No inflammatory signs  Psychiatric: She has a normal mood and affect.      Labs reviewed: Basic Metabolic Panel:  Recent Labs  16/10/96 0815 11/13/12 0923 11/27/12 0547  NA 140 140 136  K 4.2 4.3 4.7  CL 106 104 100  CO2 28 26 30   GLUCOSE 98 90 139*  BUN 13 14 14   CREATININE 0.8 0.71 0.83  CALCIUM 8.9 9.3 8.4   Liver Function Tests: No results found for this basename: AST, ALT, ALKPHOS, BILITOT, PROT, ALBUMIN,  in the last 8760 hours No results found for this basename: LIPASE, AMYLASE,  in the last 8760 hours No results found for this basename: AMMONIA,  in the last 8760 hours CBC:  Recent Labs  11/13/12 0923 11/27/12 0547 11/28/12 0500  WBC 8.2 13.8* 14.2*  NEUTROABS 4.8  --   --   HGB 13.2 11.5* 10.2*  HCT 38.7 35.4* 30.8*  MCV 87.0 92.7 90.3  PLT 265 228 207   Cardiac Enzymes: No results found for this basename: CKTOTAL, CKMB, CKMBINDEX, TROPONINI,  in the last 8760 hours BNP: No components found with this basename: POCBNP,  CBG:  Recent Labs  11/29/12 1139  GLUCAP 142*    Radiological Exams: 11/13/12 cxr- no acute cardiopulmonary findings  Assessment/Plan UNSPECIFIED HYPOTHYROIDISM Continue levothyroxine and monitor symptoms  Right knee DJD S/p right knee arthroplasty and here for STR. To work with pt/ot. Dressing clean and dry. Daily dressing changes. To follow with orthopedic. On asa 325 mg bid for dvt prophylaixs- monitor for now. Continue ca-vit d. Continue robaxin prn for muscle spasm and oxycodone 5 mg 1-2 tab q3 h prn. Fall precautions.using CPM machine and is WBAT for now. Has regular bowel regimen for now    Goals of care: to return home after completion of therapy   Labs/tests ordered: cbc

## 2012-12-02 NOTE — Assessment & Plan Note (Addendum)
S/p right knee arthroplasty and here for STR. To work with pt/ot. Dressing clean and dry. Daily dressing changes. To follow with orthopedic. On asa 325 mg bid for dvt prophylaixs- monitor for now. Continue ca-vit d. Continue robaxin prn for muscle spasm and oxycodone 5 mg 1-2 tab q3 h prn. Fall precautions.using CPM machine and is WBAT for now. Has regular bowel regimen for now

## 2012-12-02 NOTE — Assessment & Plan Note (Signed)
Continue levothyroxine and monitor symptoms

## 2012-12-31 ENCOUNTER — Encounter: Payer: Self-pay | Admitting: Nurse Practitioner

## 2012-12-31 ENCOUNTER — Non-Acute Institutional Stay (SKILLED_NURSING_FACILITY): Payer: 59 | Admitting: Nurse Practitioner

## 2012-12-31 DIAGNOSIS — IMO0002 Reserved for concepts with insufficient information to code with codable children: Secondary | ICD-10-CM

## 2012-12-31 DIAGNOSIS — M1710 Unilateral primary osteoarthritis, unspecified knee: Secondary | ICD-10-CM

## 2012-12-31 DIAGNOSIS — M1711 Unilateral primary osteoarthritis, right knee: Secondary | ICD-10-CM

## 2012-12-31 MED ORDER — LEVOTHYROXINE SODIUM 150 MCG PO TABS: 150.0000 ug | ORAL_TABLET | Freq: Every day | ORAL | Status: DC

## 2012-12-31 NOTE — Progress Notes (Signed)
  Subjective:    Patient ID: Traci [name], female    DOB: 07-03-1956, 57 y.o.   MRN: [id number]  HPI Comments: Pt was seen today for discharge from [facility]. She is s/p right TKR by Dr. Jerl Santos. She followed up with him on 12/09/12, xrays were stable at that time. Pt has no complaints today and is ready for discharge.     Review of Systems  All other systems reviewed and are negative.       Objective:   Physical Exam  Constitutional: She is oriented to person, place, and time. She appears well-developed and well-nourished. No distress.  Eyes: Pupils are equal, round, and reactive to light.  Cardiovascular: Normal rate and regular rhythm.   Pulmonary/Chest: Effort normal and breath sounds normal.  Musculoskeletal: She exhibits no edema and no tenderness.  Neurological: She is alert and oriented to person, place, and time.  Skin: Skin is warm and dry. She is not diaphoretic.  Psychiatric: She has a normal mood and affect.          Assessment & Plan:  1) Discharge home with home health pt/ot.  2) DME: cane, shower bench. 3) RX's written for 30 day supply of Synthroid 150 mcg po qd #30 and Oxy IR 5mg  one or two tabs po bid prn #180. 4) Follow up with pcp in 1-2 weeks, follow up with Dr. Jerl Santos as directed.

## 2013-01-01 DIAGNOSIS — F3289 Other specified depressive episodes: Secondary | ICD-10-CM

## 2013-01-01 DIAGNOSIS — IMO0001 Reserved for inherently not codable concepts without codable children: Secondary | ICD-10-CM

## 2013-01-01 DIAGNOSIS — Z471 Aftercare following joint replacement surgery: Secondary | ICD-10-CM

## 2013-01-01 DIAGNOSIS — F329 Major depressive disorder, single episode, unspecified: Secondary | ICD-10-CM

## 2013-01-16 ENCOUNTER — Encounter: Payer: Self-pay | Admitting: Family Medicine

## 2013-01-16 ENCOUNTER — Ambulatory Visit (INDEPENDENT_AMBULATORY_CARE_PROVIDER_SITE_OTHER): Payer: 59 | Admitting: Family Medicine

## 2013-01-16 VITALS — BP 104/76 | HR 89 | Temp 98.1°F | Wt 199.0 lb

## 2013-01-16 DIAGNOSIS — E039 Hypothyroidism, unspecified: Secondary | ICD-10-CM

## 2013-01-16 DIAGNOSIS — M1711 Unilateral primary osteoarthritis, right knee: Secondary | ICD-10-CM

## 2013-01-16 DIAGNOSIS — M1710 Unilateral primary osteoarthritis, unspecified knee: Secondary | ICD-10-CM

## 2013-01-16 DIAGNOSIS — R5381 Other malaise: Secondary | ICD-10-CM

## 2013-01-16 DIAGNOSIS — R5383 Other fatigue: Secondary | ICD-10-CM

## 2013-01-16 DIAGNOSIS — IMO0002 Reserved for concepts with insufficient information to code with codable children: Secondary | ICD-10-CM

## 2013-01-16 LAB — CBC WITH DIFFERENTIAL/PLATELET
Basophils Absolute: 0 10*3/uL (ref 0.0–0.1)
Basophils Relative: 0.4 % (ref 0.0–3.0)
Eosinophils Absolute: 0.2 10*3/uL (ref 0.0–0.7)
Eosinophils Relative: 3.2 % (ref 0.0–5.0)
HCT: 37.4 % (ref 36.0–46.0)
Hemoglobin: 12.6 g/dL (ref 12.0–15.0)
Lymphocytes Relative: 24.3 % (ref 12.0–46.0)
Lymphs Abs: 1.7 10*3/uL (ref 0.7–4.0)
MCHC: 33.6 g/dL (ref 30.0–36.0)
MCV: 87.5 fl (ref 78.0–100.0)
Monocytes Absolute: 0.6 10*3/uL (ref 0.1–1.0)
Monocytes Relative: 8.7 % (ref 3.0–12.0)
Neutro Abs: 4.4 10*3/uL (ref 1.4–7.7)
Neutrophils Relative %: 63.4 % (ref 43.0–77.0)
Platelets: 297 10*3/uL (ref 150.0–400.0)
RBC: 4.28 Mil/uL (ref 3.87–5.11)
RDW: 14.5 % (ref 11.5–14.6)
WBC: 7 10*3/uL (ref 4.5–10.5)

## 2013-01-16 LAB — TSH: TSH: 8.76 u[IU]/mL — ABNORMAL HIGH (ref 0.35–5.50)

## 2013-01-16 MED ORDER — LEVOTHYROXINE SODIUM 150 MCG PO TABS: 150.0000 ug | ORAL_TABLET | Freq: Every day | ORAL | Status: DC

## 2013-01-16 NOTE — Patient Instructions (Addendum)
Go to the lab on the way out.  We'll contact you with your lab report. Take care.  Glad to see you.  

## 2013-01-16 NOTE — Progress Notes (Signed)
She had her R knee replaced.  She is still having cold sensations and wondered about her thyroid and possible anemia.  She is planning on going back to work on Monday.  She'll continue with PT at the ortho clinic and with home exercises.  He isn't having much pain now.  She has some stiffness in the R knee.   She has been working on her diet and lost weight in the meantime.  Her appetite is still down some.  Her affect is bright today.  She is back to driving.    Meds, vitals, and allergies reviewed.   ROS: See HPI.  Otherwise, noncontributory.  nad ncat Mmm Neck supple, thyroid not ttp , no LA rrr ctab R knee with healed scar Walking with a cane today

## 2013-01-17 NOTE — Assessment & Plan Note (Signed)
Doing well after surgery

## 2013-01-17 NOTE — Assessment & Plan Note (Signed)
See notes on labs. 

## 2013-02-06 ENCOUNTER — Ambulatory Visit (INDEPENDENT_AMBULATORY_CARE_PROVIDER_SITE_OTHER): Payer: 59 | Admitting: Family Medicine

## 2013-02-06 ENCOUNTER — Encounter: Payer: Self-pay | Admitting: Family Medicine

## 2013-02-06 VITALS — BP 120/84 | HR 89 | Temp 97.8°F | Wt 202.8 lb

## 2013-02-06 DIAGNOSIS — R21 Rash and other nonspecific skin eruption: Secondary | ICD-10-CM

## 2013-02-06 MED ORDER — CLOTRIMAZOLE-BETAMETHASONE 1-0.05 % EX CREA: TOPICAL_CREAM | Freq: Two times a day (BID) | CUTANEOUS | Status: DC

## 2013-02-06 NOTE — Patient Instructions (Addendum)
Use the cream twice a day for a few days and this should resolve. When it is gone, use the cream for a few more days to make sure it doesn't come back.  Take care.

## 2013-02-06 NOTE — Assessment & Plan Note (Signed)
Could be fungal vs local irritation.  Would use lostrisone and f/u prn.  She agrees.

## 2013-02-06 NOTE — Progress Notes (Signed)
She was changed to hydrocodone to see if it would help with the pain after her knee replacement.  She was taking oxycodone prev. She is going to talk with ortho about this on Monday.    Buttock rash noted over the last week.  "The skin looks different."  Dry, patchy, and flaking.  No FCNAVD.    Meds, vitals, and allergies reviewed.   ROS: See HPI.  Otherwise, noncontributory.  nad Chaperoned exam.  B patches of dry scaly blanching red skin on the inferior buttocks, not in the midline.  Appears superficial.

## 2013-03-14 ENCOUNTER — Other Ambulatory Visit (INDEPENDENT_AMBULATORY_CARE_PROVIDER_SITE_OTHER): Payer: 59

## 2013-03-14 DIAGNOSIS — E039 Hypothyroidism, unspecified: Secondary | ICD-10-CM

## 2013-03-14 LAB — TSH: TSH: 0.14 u[IU]/mL — ABNORMAL LOW (ref 0.35–5.50)

## 2013-03-16 ENCOUNTER — Other Ambulatory Visit: Payer: Self-pay | Admitting: Family Medicine

## 2013-03-16 DIAGNOSIS — E039 Hypothyroidism, unspecified: Secondary | ICD-10-CM

## 2013-03-16 MED ORDER — LEVOTHYROXINE SODIUM 150 MCG PO TABS: 150.0000 ug | ORAL_TABLET | Freq: Every day | ORAL | Status: DC

## 2013-03-20 ENCOUNTER — Telehealth: Payer: Self-pay | Admitting: *Deleted

## 2013-03-20 MED ORDER — CLOTRIMAZOLE-BETAMETHASONE 1-0.05 % EX CREA: TOPICAL_CREAM | Freq: Two times a day (BID) | CUTANEOUS | Status: DC

## 2013-03-20 NOTE — Telephone Encounter (Signed)
Left detailed message on voicemail at work.

## 2013-03-20 NOTE — Telephone Encounter (Signed)
I would use the for the duration needed to get improvement, and then for 1 more week to see if that will resolve it.  If she needs a refill, please send.  Thanks.

## 2013-03-20 NOTE — Telephone Encounter (Signed)
I phoned Traci [name] to report her labs and she asked me to let you know that the rash on her buttocks is still there.  Each time that she thinks it is better and stops the medication, it gets worse again.  Please advise.

## 2013-05-28 ENCOUNTER — Other Ambulatory Visit: Payer: Self-pay | Admitting: Family Medicine

## 2013-05-28 ENCOUNTER — Other Ambulatory Visit (INDEPENDENT_AMBULATORY_CARE_PROVIDER_SITE_OTHER): Payer: 59

## 2013-05-28 DIAGNOSIS — E039 Hypothyroidism, unspecified: Secondary | ICD-10-CM

## 2013-05-28 LAB — TSH: TSH: 0.06 u[IU]/mL — ABNORMAL LOW (ref 0.35–5.50)

## 2013-05-28 MED ORDER — LEVOTHYROXINE SODIUM 150 MCG PO TABS: 150.0000 ug | ORAL_TABLET | Freq: Every day | ORAL | Status: DC

## 2013-06-25 ENCOUNTER — Other Ambulatory Visit: Payer: Self-pay | Admitting: Family Medicine

## 2013-06-25 DIAGNOSIS — Z1231 Encounter for screening mammogram for malignant neoplasm of breast: Secondary | ICD-10-CM

## 2013-07-07 ENCOUNTER — Ambulatory Visit (HOSPITAL_COMMUNITY)
Admission: RE | Admit: 2013-07-07 | Discharge: 2013-07-07 | Disposition: A | Discharge status: Home / Self Care | Payer: 59 | Source: Ambulatory Visit | Attending: Family Medicine | Admitting: Family Medicine

## 2013-07-07 DIAGNOSIS — Z1231 Encounter for screening mammogram for malignant neoplasm of breast: Secondary | ICD-10-CM | POA: Insufficient documentation

## 2013-07-10 ENCOUNTER — Other Ambulatory Visit: Payer: Self-pay

## 2013-07-10 ENCOUNTER — Encounter: Payer: Self-pay | Admitting: Family Medicine

## 2013-08-05 ENCOUNTER — Other Ambulatory Visit: Payer: 59

## 2013-08-06 ENCOUNTER — Other Ambulatory Visit: Payer: Self-pay | Admitting: Family Medicine

## 2013-08-06 ENCOUNTER — Other Ambulatory Visit (INDEPENDENT_AMBULATORY_CARE_PROVIDER_SITE_OTHER): Payer: 59

## 2013-08-06 DIAGNOSIS — E039 Hypothyroidism, unspecified: Secondary | ICD-10-CM

## 2013-08-06 LAB — TSH: TSH: 0.03 u[IU]/mL — ABNORMAL LOW (ref 0.35–5.50)

## 2013-08-06 MED ORDER — LEVOTHYROXINE SODIUM 150 MCG PO TABS: 150.0000 ug | ORAL_TABLET | Freq: Every day | ORAL | Status: DC

## 2013-08-08 ENCOUNTER — Telehealth: Payer: Self-pay | Admitting: Family Medicine

## 2013-08-08 NOTE — Telephone Encounter (Signed)
I called her and gave her the message about the med change, explained the dosing change and labs.  She agreed.  This isn't emergent.  Recheck in about 2 months.  We can address if the TSH remains abnormal.

## 2013-08-08 NOTE — Telephone Encounter (Signed)
Message copied by Joaquim Nam on Fri Aug 08, 2013  2:07 PM ------      Message from: Conni Elliot      Created: Fri Aug 08, 2013  1:13 PM      Regarding: Lab Result       Dr.Itay Mella,      Patient is asking for you to call her back about her lab result.  Patient saw the chart for her thyroid on echart and it scared her.  Please call patient before 5:00 at [phone]. After [phone].  Patient said she'll try to be patient until she hears from you.  Shapale sent you a message, already.      Lyla Son ------

## 2013-10-31 ENCOUNTER — Other Ambulatory Visit: Payer: 59

## 2013-11-07 ENCOUNTER — Other Ambulatory Visit (INDEPENDENT_AMBULATORY_CARE_PROVIDER_SITE_OTHER): Payer: 59

## 2013-11-07 DIAGNOSIS — E039 Hypothyroidism, unspecified: Secondary | ICD-10-CM

## 2013-11-07 LAB — TSH: TSH: 0.31 u[IU]/mL — ABNORMAL LOW (ref 0.35–5.50)

## 2013-11-10 ENCOUNTER — Encounter: Payer: Self-pay | Admitting: Family Medicine

## 2013-11-10 ENCOUNTER — Ambulatory Visit (INDEPENDENT_AMBULATORY_CARE_PROVIDER_SITE_OTHER): Payer: 59 | Admitting: Family Medicine

## 2013-11-10 VITALS — BP 132/88 | HR 100 | Temp 98.2°F | Wt 213.8 lb

## 2013-11-10 DIAGNOSIS — F3289 Other specified depressive episodes: Secondary | ICD-10-CM

## 2013-11-10 DIAGNOSIS — F329 Major depressive disorder, single episode, unspecified: Secondary | ICD-10-CM

## 2013-11-10 DIAGNOSIS — E039 Hypothyroidism, unspecified: Secondary | ICD-10-CM

## 2013-11-10 MED ORDER — LEVOTHYROXINE SODIUM 150 MCG PO TABS: 150.0000 ug | ORAL_TABLET | Freq: Every day | ORAL | Status: DC

## 2013-11-10 MED ORDER — BUPROPION HCL ER (SR) 150 MG PO TB12: ORAL_TABLET | ORAL | Status: DC

## 2013-11-10 NOTE — Patient Instructions (Signed)
Start with 1 wellbutrin a day.  After about 3 days, increase to 2 tabs a day if needed.  Update me in about 10 days.   We may or may not need to address your thyroid dose later on.

## 2013-11-10 NOTE — Assessment & Plan Note (Signed)
Restart wellbutrin and she'll update me.  She agrees.  Okay for outpatient fu.

## 2013-11-10 NOTE — Progress Notes (Signed)
Pre visit review using our clinic review tool, if applicable. No additional management support is needed unless otherwise documented below in the visit note.  H/o MDD.  Off meds. Has been irritable, crying, "not doing well."  No SI/HI.  Prev with response to wellbutrin. More HA recently.   Hypothyroid.  Difficult to regulate TSH.  Hair loss and fatigue noted.  Compliant with meds. Discussed options, ie observation vs thyroid u/s vs endo referral.   Meds, vitals, and allergies reviewed.   ROS: See HPI.  Otherwise, noncontributory.  nad Tearful but regains composure.  Mmm rrr ctab abd soft Neck supple, no LA, No TMG

## 2013-11-10 NOTE — Assessment & Plan Note (Signed)
Unclear how much is related to mood vs thyroid. Would check u/s now and not change meds.  We can refer to endo if needed.  She agrees.

## 2013-11-18 ENCOUNTER — Ambulatory Visit
Admission: RE | Admit: 2013-11-18 | Discharge: 2013-11-18 | Disposition: A | Discharge status: Home / Self Care | Payer: 59 | Source: Ambulatory Visit | Attending: Family Medicine | Admitting: Family Medicine

## 2013-11-18 DIAGNOSIS — E039 Hypothyroidism, unspecified: Secondary | ICD-10-CM

## 2013-12-27 ENCOUNTER — Ambulatory Visit (INDEPENDENT_AMBULATORY_CARE_PROVIDER_SITE_OTHER): Payer: 59 | Admitting: Family Medicine

## 2013-12-27 ENCOUNTER — Encounter: Payer: Self-pay | Admitting: Family Medicine

## 2013-12-27 VITALS — BP 138/90 | HR 85 | Temp 97.9°F | Wt 210.0 lb

## 2013-12-27 DIAGNOSIS — J209 Acute bronchitis, unspecified: Secondary | ICD-10-CM

## 2013-12-27 MED ORDER — LEVOFLOXACIN 500 MG PO TABS: 500.0000 mg | ORAL_TABLET | Freq: Every day | ORAL | Status: DC

## 2013-12-27 NOTE — Progress Notes (Signed)
   Subjective:    Patient ID: Traci [name], female    DOB: 12/20/1955, 58 y.o.   MRN: [id number]  HPI Here for 3 weeks of chest tightness and coughing p yellow sputum. No fever.    Review of Systems  Constitutional: Negative.   HENT: Positive for congestion and postnasal drip. Negative for sinus pressure.   Eyes: Negative.   Respiratory: Positive for cough and chest tightness.        Objective:   Physical Exam  Constitutional: She appears well-developed and well-nourished.  HENT:  Right Ear: External ear normal.  Left Ear: External ear normal.  [name]: [name] normal.  Mouth/Throat: Oropharynx is clear and moist.  Eyes: Conjunctivae are normal.  Pulmonary/Chest: Effort normal. She has no wheezes. She has no rales.  Scattered rhonchi   Lymphadenopathy:    She has no cervical adenopathy.          Assessment & Plan:  Add Mucinex

## 2014-01-05 ENCOUNTER — Telehealth: Payer: Self-pay

## 2014-01-05 NOTE — Telephone Encounter (Signed)
I would take the next/last pill and then update us on Wednesday.  We'll go from there.  We may need to extend it at that point, but I wouldn't do it yet.  Thanks.

## 2014-01-05 NOTE — Telephone Encounter (Signed)
pt left v/m; [facility]; pt has one more dose of Levoquin to take; pt still coughing up yellow phlegm but pt is starting to feel better.No fever,CP or SOB. Pt has slight wheezing. Pt wants to know if could get refill on Levoquin to get rid of all symptoms or does pt need to be seen?[facility].

## 2014-01-05 NOTE — Telephone Encounter (Signed)
Left detailed message on voicemail.  

## 2014-01-08 MED ORDER — LEVOFLOXACIN 500 MG PO TABS: 500.0000 mg | ORAL_TABLET | Freq: Every day | ORAL | Status: AC

## 2014-01-08 NOTE — Telephone Encounter (Signed)
Noted.  Extend for another 4 days and if not better, then needs recheck.  14 days total should cover.

## 2014-01-08 NOTE — Telephone Encounter (Signed)
Patient advised.

## 2014-01-08 NOTE — Addendum Note (Signed)
Addended by: Joaquim NamUNCAN, Caprina Wussow S on: 01/08/2014 02:11 PM   Modules accepted: Orders

## 2014-01-08 NOTE — Telephone Encounter (Signed)
Pt left v/m; pt still has chest congestion, hoarse and still coughing and pt [facility]pt request cb.

## 2014-01-23 ENCOUNTER — Encounter: Payer: Self-pay | Admitting: Family Medicine

## 2014-01-23 ENCOUNTER — Ambulatory Visit (INDEPENDENT_AMBULATORY_CARE_PROVIDER_SITE_OTHER)
Admission: RE | Admit: 2014-01-23 | Discharge: 2014-01-23 | Disposition: A | Discharge status: Home / Self Care | Payer: 59 | Source: Ambulatory Visit | Attending: Family Medicine | Admitting: Family Medicine

## 2014-01-23 ENCOUNTER — Ambulatory Visit (INDEPENDENT_AMBULATORY_CARE_PROVIDER_SITE_OTHER): Payer: 59 | Admitting: Family Medicine

## 2014-01-23 VITALS — BP 124/74 | HR 102 | Temp 98.0°F | Wt 208.5 lb

## 2014-01-23 DIAGNOSIS — R059 Cough, unspecified: Secondary | ICD-10-CM

## 2014-01-23 DIAGNOSIS — R05 Cough: Secondary | ICD-10-CM

## 2014-01-23 MED ORDER — ALBUTEROL SULFATE HFA 108 (90 BASE) MCG/ACT IN AERS: 1.0000 | INHALATION_SPRAY | Freq: Four times a day (QID) | RESPIRATORY_TRACT | Status: DC | PRN

## 2014-01-23 NOTE — Progress Notes (Signed)
Pre visit review using our clinic review tool, if applicable. No additional management support is needed unless otherwise documented below in the visit note.  She had a GI illness then likely bronchitis.  Had seen Dr. Clent Ridges, started on abx for 10 days, extended to 14 days total.  Done with abx as of 01/11/14.  She was improving at that point (cough never fully resolved) and then the cough worsened again.   Chest is sore from coughing.  Coughing continue to the point of vomiting.  No fevers but some sweats at night.  No diarrhea now.   Some sputum.  No ear pain but ears are stuffy.  Tessalon and delsym didn't help.    Meds, vitals, and allergies reviewed.   ROS: See HPI.  Otherwise, noncontributory.  nad ncat Mmm Tm wnl Nasal and OP exam wnl except for mild cobblestoning Neck supple, no LA Cough noted but ctab, no wheeze No inc in WOB Ext w/o edema

## 2014-01-23 NOTE — Patient Instructions (Signed)
Go to the lab on the way out.  We'll contact you with your xray report. Use the inhaler for the cough in the meantime.  Take care.

## 2014-01-27 DIAGNOSIS — R059 Cough, unspecified: Secondary | ICD-10-CM | POA: Insufficient documentation

## 2014-01-27 DIAGNOSIS — R05 Cough: Secondary | ICD-10-CM | POA: Insufficient documentation

## 2014-01-27 NOTE — Assessment & Plan Note (Signed)
ctab and would use SABA in meantime.  CXR w/o acute changes.  Nontoxic. Okay for outpatient f/u.  D/w pt.  She agrees.

## 2014-02-02 ENCOUNTER — Encounter: Payer: Self-pay | Admitting: Internal Medicine

## 2014-06-01 ENCOUNTER — Ambulatory Visit (INDEPENDENT_AMBULATORY_CARE_PROVIDER_SITE_OTHER): Payer: 59 | Admitting: Family Medicine

## 2014-06-01 ENCOUNTER — Encounter: Payer: Self-pay | Admitting: Family Medicine

## 2014-06-01 VITALS — BP 130/88 | HR 89 | Temp 98.4°F | Wt 209.2 lb

## 2014-06-01 DIAGNOSIS — R29818 Other symptoms and signs involving the nervous system: Secondary | ICD-10-CM

## 2014-06-01 DIAGNOSIS — E039 Hypothyroidism, unspecified: Secondary | ICD-10-CM

## 2014-06-01 DIAGNOSIS — IMO0001 Reserved for inherently not codable concepts without codable children: Secondary | ICD-10-CM

## 2014-06-01 DIAGNOSIS — R29898 Other symptoms and signs involving the musculoskeletal system: Secondary | ICD-10-CM

## 2014-06-01 NOTE — Progress Notes (Signed)
Pre visit review using our clinic review tool, if applicable. No additional management support is needed unless otherwise documented below in the visit note.  H/o difficult to regulate thyroid levels and cold sensation, cold feet, fatigue.  On thyroid replacement currently, compliant.    B arm pain.  No trunk pain. "Sore like I've worked out, without working out."  No clear cause.  No h/o PMR.    Handwriting change.  Much more "herky jerky" when writing.  She doesn't write much anymore, she types most things.  Change noted in the last year.  L handed. No tremor.  H/o presumed CVA in 2002 with L facial droop since that point, but no other neuro sx attributed to that event.    Flu shot done at work this past Thursday.   Meds, vitals, and allergies reviewed.   ROS: See HPI.  Otherwise, noncontributory.  GEN: nad, alert and oriented HEENT: mucous membranes moist, L facial droop at baseline but CN wnl o/w.  NECK: supple w/o LA, no TMG CV: rrr.  PULM: ctab, no inc wob ABD: soft, +bs EXT: no edema SKIN: no acute rash S/S/DTR wnl x4

## 2014-06-01 NOTE — Patient Instructions (Signed)
Go to the lab on the way out.  We'll contact you with your lab report. Traci [name] will call about your referral to see Traci [name].  Take care.  We'll be in touch.

## 2014-06-02 DIAGNOSIS — R29898 Other symptoms and signs involving the musculoskeletal system: Secondary | ICD-10-CM | POA: Insufficient documentation

## 2014-06-02 LAB — CBC WITH DIFFERENTIAL/PLATELET
Basophils Absolute: 0.1 10*3/uL (ref 0.0–0.1)
Basophils Relative: 0.8 % (ref 0.0–3.0)
Eosinophils Absolute: 0.2 10*3/uL (ref 0.0–0.7)
Eosinophils Relative: 2.7 % (ref 0.0–5.0)
HCT: 38.4 % (ref 36.0–46.0)
Hemoglobin: 12.9 g/dL (ref 12.0–15.0)
Lymphocytes Relative: 20.9 % (ref 12.0–46.0)
Lymphs Abs: 1.7 10*3/uL (ref 0.7–4.0)
MCHC: 33.7 g/dL (ref 30.0–36.0)
MCV: 88.6 fl (ref 78.0–100.0)
Monocytes Absolute: 0.6 10*3/uL (ref 0.1–1.0)
Monocytes Relative: 7.5 % (ref 3.0–12.0)
Neutro Abs: 5.4 10*3/uL (ref 1.4–7.7)
Neutrophils Relative %: 68.1 % (ref 43.0–77.0)
Platelets: 289 10*3/uL (ref 150.0–400.0)
RBC: 4.33 Mil/uL (ref 3.87–5.11)
RDW: 14.2 % (ref 11.5–15.5)
WBC: 8 10*3/uL (ref 4.0–10.5)

## 2014-06-02 LAB — COMPREHENSIVE METABOLIC PANEL
ALT: 21 U/L (ref 0–35)
AST: 24 U/L (ref 0–37)
Albumin: 4.3 g/dL (ref 3.5–5.2)
Alkaline Phosphatase: 156 U/L — ABNORMAL HIGH (ref 39–117)
BUN: 13 mg/dL (ref 6–23)
CO2: 25 mEq/L (ref 19–32)
Calcium: 9.4 mg/dL (ref 8.4–10.5)
Chloride: 105 mEq/L (ref 96–112)
Creatinine, Ser: 0.9 mg/dL (ref 0.4–1.2)
GFR: 68.83 mL/min (ref >60.00)
Glucose, Bld: 86 mg/dL (ref 70–99)
Potassium: 4.5 mEq/L (ref 3.5–5.1)
Sodium: 138 mEq/L (ref 135–145)
Total Bilirubin: 0.6 mg/dL (ref 0.2–1.2)
Total Protein: 7.2 g/dL (ref 6.0–8.3)

## 2014-06-02 LAB — SEDIMENTATION RATE: Sed Rate: 27 mm/hr — ABNORMAL HIGH (ref 0–22)

## 2014-06-02 LAB — T4, FREE: Free T4: 1.13 ng/dL (ref 0.60–1.60)

## 2014-06-02 LAB — TSH: TSH: 0.59 u[IU]/mL (ref 0.35–4.50)

## 2014-06-02 LAB — CK: Total CK: 138 U/L (ref 7–177)

## 2014-06-02 NOTE — Assessment & Plan Note (Signed)
Unclear cause, check ESR and CK today.  D/w pt.  No h/o PMR noted.

## 2014-06-02 NOTE — Assessment & Plan Note (Signed)
I want neuro input.  She had been stable for > 10 years after the CVA with the gradual handwriting change w/o tremor noted in the last year.  See notes on labs.  I didn't repeat imaging at this point- I wanted neuro input first.  Pt agrees with plan.

## 2014-06-02 NOTE — Assessment & Plan Note (Signed)
Recheck labs today.  This could be contributing to the cold sensation.

## 2014-06-05 ENCOUNTER — Ambulatory Visit (INDEPENDENT_AMBULATORY_CARE_PROVIDER_SITE_OTHER): Payer: 59 | Admitting: Neurology

## 2014-06-05 ENCOUNTER — Encounter: Payer: Self-pay | Admitting: Neurology

## 2014-06-05 VITALS — BP 140/84 | HR 102 | Ht 65.0 in | Wt 211.0 lb

## 2014-06-05 DIAGNOSIS — R6889 Other general symptoms and signs: Secondary | ICD-10-CM

## 2014-06-05 DIAGNOSIS — J383 Other diseases of vocal cords: Secondary | ICD-10-CM

## 2014-06-05 DIAGNOSIS — R29810 Facial weakness: Secondary | ICD-10-CM

## 2014-06-05 DIAGNOSIS — R490 Dysphonia: Secondary | ICD-10-CM

## 2014-06-05 DIAGNOSIS — R292 Abnormal reflex: Secondary | ICD-10-CM

## 2014-06-05 DIAGNOSIS — R471 Dysarthria and anarthria: Secondary | ICD-10-CM

## 2014-06-05 LAB — CALCIUM: Calcium: 9.3 mg/dL (ref 8.4–10.5)

## 2014-06-05 LAB — VITAMIN B12: Vitamin B-12: 425 pg/mL (ref 211–911)

## 2014-06-05 LAB — PHOSPHORUS: Phosphorus: 3.9 mg/dL (ref 2.3–4.6)

## 2014-06-05 NOTE — Progress Notes (Signed)
Traci NoseMary Lynn [name] was seen today in neurologic consultation at the request of [name], MD.  The consultation is for the evaluation of abnormal handwriting.  Pt states that about 1.5 years ago, she noted the sx's.  She states she had a TKA on the R and many of the sx's started after that.  She then began to note difficulty with handwriting.  She is L hand dominant.  Her handwriting has become slow and "jerky."  She states that she has an achiness in both arms and the arms are hyperesthetic.  She has to sleep with pillows under the arms.  Even a book feels heavy to her when she holds it.  She has no trouble with getting up out of a chair.  No falls.  No vision problems.  No diplopia.  There is no change in the voice strength but she states that she doesn't enunciate the words like in the past.  No loss of bladder and bowel control.  She wonders if some of the sx's are stress induced but states that she does not think that the pain in the arms is.  She just recently had a CPK of 138.  She had a sedimentation rate of 27 and a TSH of 0.59.  In 2002, pt states that she was at work talking and suddenly had slurred speech.  She drove herself to [employer].  She was told that her face was twisted.  She was told that she had a stroke but her MRI was normal.  She has had facial asymmetry ever since and has noted slowing of cognition since that time (just have to tell her things slowly).  She is on no stroke prophylaxis currently.  She believes that she had an extensive workup for causes of stroke at that time and there was no cause found.  She would have only been 58 years old at the time.  Neuroimaging has previously been performed.  MRI was completed in 2002.  This was normal.  MRA was also done in 2002 that demonstrated focal stenosis of the A1 segment of the left ACA.    ALLERGIES:   Allergies  Allergen Reactions  . Codeine Itching  . Meperidine Hcl     syncope  . Simvastatin     REACTION:  muscle aches    CURRENT MEDICATIONS:  Outpatient Encounter Prescriptions as of 06/05/2014  Medication Sig  . albuterol (PROVENTIL HFA;VENTOLIN HFA) 108 (90 BASE) MCG/ACT inhaler Inhale 1-2 puffs into the lungs as needed (cough).  [name]. aspirin-acetaminophen-caffeine (EXCEDRIN MIGRAINE) 250-250-65 MG per tablet Take by mouth every 6 (six) hours as needed for headache.  [name]. buPROPion (WELLBUTRIN SR) 150 MG 12 hr tablet Take 1 tab up to 2 times a day.  . Calcium Carb-Cholecalciferol (CALCIUM 1000 + D PO) Take 2,000 Units by mouth daily.  . fish oil-omega-3 fatty acids 1000 MG capsule Take 1 g by mouth daily.    [name]. levothyroxine (SYNTHROID, LEVOTHROID) 150 MCG tablet Take 1 tablet (150 mcg total) by mouth daily. Except for 0.5 tab on Sundays and Wednesdays.  Total of 6 pills per 7 days.  . Multiple Vitamin (MULTIVITAMIN) tablet Take 1 tablet by mouth daily.   . Scar Treatment Products (MEDERMA SPF 30) CREA Apply topically.  . [DISCONTINUED] albuterol (PROVENTIL HFA;VENTOLIN HFA) 108 (90 BASE) MCG/ACT inhaler Inhale 1-2 puffs into the lungs every 6 (six) hours as needed (cough).    PAST MEDICAL HISTORY:   Past Medical History  Diagnosis Date  .  Hyperlipidemia   . Depression     Previously responded to Wellbutrin  . Headache(784.0)   . Migraines   . Stroke 12/2000  . Thyroid disease     Hypothyroidism  . Abnormal alkaline phosphatase test     with neg eval prev  . Hypothyroidism     PAST SURGICAL HISTORY:   Past Surgical History  Procedure Laterality Date  . Laparoscopic gastric banding  2006  . Cholecystectomy      ? 20 years ago  . Total knee arthroplasty Right 11/26/2012    Procedure: TOTAL KNEE ARTHROPLASTY- right ;  Surgeon: Velna Ochs, MD;  Location: [facility] OR;  Service: Orthopedics;  Laterality: Right;    SOCIAL HISTORY:   History   Social History  . Marital Status: Single    Spouse Name: N/A    Number of Children: 0  . Years of Education: N/A   Occupational History  .  Paralegal Unemployed   Social History Main Topics  . Smoking status: Never Smoker   . Smokeless tobacco: Never Used  . Alcohol Use: No     Comment: "maybe one on new years"  . Drug Use: No  . Sexual Activity: Not on file   Other Topics Concern  . Not on file   Social History Narrative   Regular exercise:  Yes   Works for [employer]    FAMILY HISTORY:   Family Status  Relation Status Death Age  . Mother Deceased     dementia, CHF, diabetes  . Father Deceased     heart attack  . Sister Alive     melanoma, depression  . Brother Alive     healthy    ROS:  Feet are cold all the time.  A complete 10 system review of systems was obtained and was unremarkable apart from what is mentioned above.  PHYSICAL EXAMINATION:    VITALS:   Filed Vitals:   06/05/14 0914  BP: 140/84  Pulse: 102  Height: 5\' 5"  (1.651 m)  Weight: 211 lb (95.709 kg)    GEN:  Normal appears female in no acute distress.  Appears stated age. HEENT:  Normocephalic, atraumatic. The mucous membranes are moist. The superficial temporal arteries are without ropiness or tenderness.  No tongue fasciculations noted. Cardiovascular: Regular rate and rhythm. Lungs: Clear to auscultation bilaterally.  Intermittently, she has loud inspiratory gasps. Neck/Heme: There are no carotid bruits noted bilaterally.  NEUROLOGICAL: Orientation:  The patient is alert and oriented x 3.  Fund of knowledge is appropriate.  Recent and remote memory intact.  Attention span and concentration normal.  Repeats and names without difficulty. Cranial nerves: There is L facial droop when the muscles of facial expression are not activated, but when she smiles, there is equal nasal labial fold.  The pupils are equal round and reactive to light bilaterally. Fundoscopic exam reveals clear disc margins bilaterally. Extraocular muscles are intact and visual fields are full to confrontational testing.  There are no square wave jerks Speech  is fluent and clear.   There is a pseudobulbar quality to it.  She does not have particular difficulty with guttural sounds.  Soft palate rises symmetrically and there is no tongue deviation. Hearing is intact to conversational tone. Tone: Tone is good throughout. Sensation: Sensation is intact to light touch and pinprick throughout (facial, trunk, extremities). Vibration is intact at the bilateral big toe. There is no extinction with double simultaneous stimulation. There is no sensory dermatomal level identified.  Coordination:  The patient has no difficulty with RAM's or FNF bilaterally. Motor: Strength is 5/5 in the bilateral upper and lower extremities with the exception of finger abductors bilaterally and strength there is 3/5.  Shoulder shrug is equal and symmetric. There is no pronator drift.  There are no fasciculations noted (patient was undressed at the time). DTR's: Deep tendon reflexes are 2+-3/4 at the bilateral biceps, triceps, brachioradialis, patella and 2/4 at the bilateral achilles.  Plantar responses is downgoing on the right and upgoing on the left. Gait and Station: The patient is able to ambulate without difficulty.  She is able to arise out of a deep-seated chair without the use of the hands. Handwriting sample:  The patient did provide a handwriting sample.  I saw no dystonic posturing.   No results found for this basename: VITAMINB12   Lab Results  Component Value Date   TSH 0.59 06/01/2014      IMPRESSION/PLAN  1. hyperesthesia of the arms in combination with pseudobulbar speech and hyperreflexia.  -I. think that the differential is wide, but I am worried about things such as inclusion body myositis and motor neuron disease, although I did not see any fasciculations.  She is hyperreflexic, but reportedly has had a stroke in the past, but has a normal MRI.  -I will order labs.  Labs ordered today:  B12, SPEP and UPEP with immunofixation, calcium, phosphorous, PTH,  Copper, Lyme, Heavy metal screen, AchR ab  -MRI brain given facial asymmetry, upgoing toe, hyperreflexia (may be old but hard to tell and prior MRI from 2002 normal).  -Needs EMG  -See no evidence of neurodegenerative movement disorder and may need her to seek the expertise of my partner Dr. Allena Katz, depending on the results of the above testing.  -Greater than 50% of visit coordinating care.  Visit time 60 min.

## 2014-06-05 NOTE — Patient Instructions (Signed)
1. We will call you with an appt for your EMG.  2. Your provider has requested that you have labwork completed today. [facility] on the first floor of this building before leaving the office today. 3. We [facility] on 06/22/2014 at 9:00 am. Please arrive 15 minutes prior and go to 1st floor radiology. If you need to reschedule for any reason please call [phone].

## 2014-06-07 LAB — COPPER, SERUM: Copper: 128 ug/dL (ref 70–175)

## 2014-06-08 LAB — B. BURGDORFI ANTIBODIES: B burgdorferi Ab IgG+IgM: 0.09 {ISR}

## 2014-06-08 LAB — PARATHYROID HORMONE, INTACT (NO CA): PTH: 40 pg/mL (ref 14–64)

## 2014-06-09 LAB — HEAVY METALS, BLOOD

## 2014-06-09 LAB — UIFE/LIGHT CHAINS/TP QN, 24-HR UR
Albumin, U: DETECTED
Alpha 1, Urine: DETECTED — AB
Alpha 2, Urine: DETECTED — AB
Beta, Urine: DETECTED — AB
Gamma Globulin, Urine: DETECTED — AB
Total Protein, Urine: 7 mg/dL (ref 5–24)

## 2014-06-09 LAB — SPEP & IFE WITH QIG
Albumin ELP: 60.2 % (ref 55.8–66.1)
Alpha-1-Globulin: 4.2 % (ref 2.9–4.9)
Alpha-2-Globulin: 8.5 % (ref 7.1–11.8)
Beta 2: 7.6 % — ABNORMAL HIGH (ref 3.2–6.5)
Beta Globulin: 6.8 % (ref 4.7–7.2)
Gamma Globulin: 12.7 % (ref 11.1–18.8)
IgA: 412 mg/dL — ABNORMAL HIGH (ref 69–380)
IgG (Immunoglobin G), Serum: 892 mg/dL (ref 690–1700)
IgM, Serum: 46 mg/dL — ABNORMAL LOW (ref 52–322)
Total Protein, Serum Electrophoresis: 6.9 g/dL (ref 6.0–8.3)

## 2014-06-12 LAB — MYASTHENIA GRAVIS PANEL 2
Acetylcholine Rec Binding: <0.3 nmol/L
Acetylcholine Rec Mod Ab: 17 %
Aceytlcholine Rec Bloc Ab: <15 % of inhibition (ref <15)

## 2014-06-13 ENCOUNTER — Other Ambulatory Visit: Payer: Self-pay | Admitting: Neurology

## 2014-06-16 LAB — HEAVY METALS, BLOOD
Arsenic: <3 mcg/L (ref <23)
Lead: <2 ug/dL (ref <10)
Mercury, B: <4 mcg/L (ref <=10)

## 2014-06-22 ENCOUNTER — Ambulatory Visit (HOSPITAL_COMMUNITY): Admission: RE | Admit: 2014-06-22 | Payer: 59 | Source: Ambulatory Visit

## 2014-07-07 ENCOUNTER — Ambulatory Visit (HOSPITAL_COMMUNITY)
Admission: RE | Admit: 2014-07-07 | Discharge: 2014-07-07 | Disposition: A | Discharge status: Home / Self Care | Payer: 59 | Source: Ambulatory Visit | Attending: Radiology | Admitting: Radiology

## 2014-07-07 DIAGNOSIS — R292 Abnormal reflex: Secondary | ICD-10-CM

## 2014-07-07 DIAGNOSIS — M79602 Pain in left arm: Secondary | ICD-10-CM | POA: Insufficient documentation

## 2014-07-07 DIAGNOSIS — H539 Unspecified visual disturbance: Secondary | ICD-10-CM | POA: Diagnosis not present

## 2014-07-07 DIAGNOSIS — F329 Major depressive disorder, single episode, unspecified: Secondary | ICD-10-CM | POA: Insufficient documentation

## 2014-07-07 DIAGNOSIS — R29810 Facial weakness: Secondary | ICD-10-CM | POA: Insufficient documentation

## 2014-07-07 DIAGNOSIS — M79601 Pain in right arm: Secondary | ICD-10-CM | POA: Insufficient documentation

## 2014-07-07 MED ORDER — GADOBENATE DIMEGLUMINE 529 MG/ML IV SOLN
20.0000 mL | Freq: Once | INTRAVENOUS | Status: AC | PRN
  Administered 2014-07-07: 20 mL via INTRAVENOUS

## 2014-07-08 ENCOUNTER — Telehealth: Payer: Self-pay | Admitting: Neurology

## 2014-07-08 NOTE — Telephone Encounter (Signed)
-----   Message from Octaviano Battyebecca S Tat, DO sent at 07/08/2014  8:17 AM EST ----- Let pt know that her MRI looks normal

## 2014-07-08 NOTE — Telephone Encounter (Signed)
Patient made aware MR looks normal.

## 2014-07-22 ENCOUNTER — Ambulatory Visit (INDEPENDENT_AMBULATORY_CARE_PROVIDER_SITE_OTHER): Payer: 59 | Admitting: Neurology

## 2014-07-22 DIAGNOSIS — R6889 Other general symptoms and signs: Secondary | ICD-10-CM

## 2014-07-22 DIAGNOSIS — M5412 Radiculopathy, cervical region: Secondary | ICD-10-CM

## 2014-07-22 DIAGNOSIS — R29810 Facial weakness: Secondary | ICD-10-CM

## 2014-07-22 DIAGNOSIS — R490 Dysphonia: Secondary | ICD-10-CM

## 2014-07-22 DIAGNOSIS — R292 Abnormal reflex: Secondary | ICD-10-CM

## 2014-07-22 DIAGNOSIS — J383 Other diseases of vocal cords: Secondary | ICD-10-CM

## 2014-07-22 NOTE — Procedures (Signed)
[facility]  [address], [address]  [place], [place] [address] Tel: [phone] Fax:  [phone] Test Date:  07/22/2014  Patient: Traci SabalMary [name] DOB: 1956/01/20 Physician: Nita Sickleonika Khalil Belote, DO  Sex: Female Height: 5\' 5"  Ref Phys: Kerin Salenat, Rebecca  ID#: [id number] Temp: 34.0C Technician: Ala BentSusan Reid R. NCS T.   Patient Complaints: Patient is a 58 year old female here for evaluation of hyperesthesia on both arms and weakness worse on the left.  NCV & EMG Findings: Extensive electrodiagnostic testing of the left upper and lower extremity with additional studies of the right side shows:  1. All sensory responses including the left median, ulnar, radial, sural and superficial peroneal sensory nerves are within normal limits. Palmar studies also normal. 2. All motor responses including the left median, ulnar, peroneal, and tibial nerves are within normal limits. 3. In the upper extremity, chronic motor axon loss changes are seen affecting the left first dorsal interosseous, flex pollicis longus, extensor indicis, and triceps muscles, without accompanied active denervation. Similar findings are present in the right upper extremity. 4. In the left lower extremity, chronic motor axon loss changes are seen affecting the rectus femoris and abductor longus muscles without accompanied active changes. These findings are not present in the right lower extremity.   Impression: 1. Chronic C8 radiculopathy affecting bilateral upper extremities, mild in degree electrically. 2. Chronic L3-L4 radiculopathy affecting the left lower extremity, mild in degree electrically. 3. There is no evidence of a generalized sensorimotor polyneuropathy, diffuse myopathy, or widespread disorder of anterior horn cells.   ___________________________ Nita Sickleonika Shermika Balthaser, DO    Nerve Conduction Studies Anti Sensory Summary Table   Site NR Peak (ms) Norm Peak (ms) P-T Amp (V) Norm P-T Amp  Left Median Anti Sensory  (2nd Digit)  32C  Wrist    2.8 <3.6 22.7 >15  Left Radial Anti Sensory (Base 1st Digit)  34C  Wrist    2.0 <2.7 15.3 >14  Left Sup Peroneal Anti Sensory (Ant Lat Mall)  12 cm    2.3 <4.6 5.4 >4  Left Sural Anti Sensory (Lat Mall)  Calf    2.6 <4.6 5.8 >4  Left Ulnar Anti Sensory (5th Digit)  32C  Wrist    2.6 <3.1 12.2 >10   Motor Summary Table   Site NR Onset (ms) Norm Onset (ms) O-P Amp (mV) Norm O-P Amp Site1 Site2 Delta-0 (ms) Dist (cm) Vel (m/s) Norm Vel (m/s)  Left Median Motor (Abd Poll Brev)  34C  Wrist    3.4 <4.0 9.5 >6 Elbow Wrist 4.8 28.0 58 >50  Elbow    8.2  9.0         Left Peroneal Motor (Ext Dig Brev)  32C  Ankle    3.3 <6.0 5.8 >2.5 B Fib Ankle 7.0 30.0 43 >40  B Fib    10.3  4.6  Poplt B Fib 2.4 10.0 42 >40  Poplt    12.7  4.5         Left Peroneal TA Motor (Tib Ant)  32C  Fib Head    1.3 <4.5 6.5 >3 Poplit Fib Head 2.7 11.0 41 >40  Poplit    4.0  5.6         Left Tibial Motor (Abd Hall Brev)  32C  Ankle    4.0 <6.0 8.4 >4 Knee Ankle 8.4 42.0 50 >40  Knee    12.4  5.5         Left Ulnar Motor (Abd  Dig Minimi)  32C  Wrist    2.0 <3.1 8.2 >7 B Elbow Wrist 3.4 21.0 62 >50  B Elbow    5.4  7.9  A Elbow B Elbow 1.8 10.0 56 >50  A Elbow    7.2  7.5          Comparison Summary Table   Site NR Peak (ms) Norm Peak (ms) P-T Amp (V) Site1 Site2 Delta-P (ms) Norm Delta (ms)  Left Median/Ulnar Palm Comparison (Wrist - 8cm)  34C  Median Palm    1.8 <2.2 36.2 Median Palm Ulnar Palm 0.1   Ulnar Palm    1.9 <2.2 19.2       F Wave Studies   NR F-Lat (ms) Lat Norm (ms) L-R F-Lat (ms)  Left Ulnar (Mrkrs) (Abd Dig Min)  32C     27.76 <33    EMG   Side Muscle Ins Act Fibs Psw Fasc Number Recrt Dur Dur. Amp Amp. Poly Poly. Comment  Left 1stDorInt Nml Nml Nml Nml 1- Mod-R Few 1+ Few 1+ Nml Nml N/A  Left FlexPolLong Nml Nml Nml Nml 1- Mod-R Few 1+ Few 1+ Nml Nml N/A  Left Ext Indicis Nml Nml Nml Nml 1- Rapid Some 1+ Some 1+ Few 1+ N/A  Left RectFemoris Nml Nml  Nml Nml 1- Rapid Some 1+ Some 1+ Nml Nml N/A  Left Deltoid Nml Nml Nml Nml 1- Mod Few 1+ Nml Nml Nml Nml N/A  Left AdductorLong Nml Nml Nml Nml 1- Rapid Some 1+ Some 1+ Nml Nml N/A  Left Triceps Nml Nml Nml Nml 1- Mod-R Few 1+ Few 1+ Nml Nml N/A  Left GluteusMed Nml Nml Nml Nml Nml Nml Nml Nml Nml Nml Nml Nml N/A  Left Gastroc Nml Nml Nml Nml Nml Nml Nml Nml Nml Nml Nml Nml N/A  Left Biceps Nml Nml Nml Nml Nml Nml Nml Nml Nml Nml Nml Nml N/A  Left PronatorTeres Nml Nml Nml Nml Nml Nml Nml Nml Nml Nml Nml Nml N/A  Left Infraspinatus Nml Nml Nml Nml Nml Nml Nml Nml Nml Nml Nml Nml N/A  Left AntTibialis Nml Nml Nml Nml Nml Nml Nml Nml Nml Nml Nml Nml N/A  Left BicepsFemS Nml Nml Nml Nml Nml Nml Nml Nml Nml Nml Nml Nml N/A  Left Flex Dig Long Nml Nml Nml Nml Nml Nml Nml Nml Nml Nml Nml Nml N/A  Right Ext Indicis Nml Nml Nml Nml 1- Rapid Some 1+ Nml Nml Nml Nml N/A  Right 1stDorInt Nml Nml Nml Nml 1- Rapid Some 1+ Some 1+ Nml Nml N/A  Right Triceps Nml Nml Nml Nml 1- Rapid Few 1+ Nml Nml Nml Nml N/A  Right RectFemoris Nml Nml Nml Nml Nml Nml Nml Nml Nml Nml Nml Nml N/A  Right AdductorLong Nml Nml Nml Nml Nml Nml Nml Nml Nml Nml Nml Nml N/A      Waveforms:

## 2014-07-24 ENCOUNTER — Telehealth: Payer: Self-pay | Admitting: Neurology

## 2014-07-24 DIAGNOSIS — R471 Dysarthria and anarthria: Secondary | ICD-10-CM

## 2014-07-24 DIAGNOSIS — R292 Abnormal reflex: Secondary | ICD-10-CM

## 2014-07-24 NOTE — Telephone Encounter (Addendum)
Patient made aware that Dr Tat is recommending these tests. MR C Spine scheduled for 08/17/2014 at 8:00 am. Patient aware. Referral faxed to Dr Nickola MajorHawkes at [phone] with confirmation received. They will contact the patient with appt. Patient made aware.

## 2014-07-24 NOTE — Telephone Encounter (Signed)
Left message on machine for patient to call back.

## 2014-07-24 NOTE — Telephone Encounter (Addendum)
-----   Message from Octaviano Battyebecca S Tat, DO sent at 07/23/2014  2:09 PM EST ----- Lesly RubensteinJade, please tell pt that we would like to get MRI of the cervical spine and also c/s with Dr. Nickola MajorHawkes at rheumatology to r/o PMR.  If that is all okay, then will have Dr. Allena KatzPatel see her for 2nd opinion

## 2014-07-28 ENCOUNTER — Telehealth: Payer: Self-pay | Admitting: Neurology

## 2014-07-28 NOTE — Telephone Encounter (Signed)
Received fax from Dr Nickola MajorHawkes' office that patient is scheduled for appt on 08/20/2014 at 10:00 am- to arrive 15 min early. Patient told to call [phone] if this is not a good date/time for appt. She is to call us back with any questions.

## 2014-08-17 ENCOUNTER — Ambulatory Visit (HOSPITAL_COMMUNITY)
Admission: RE | Admit: 2014-08-17 | Discharge: 2014-08-17 | Disposition: A | Discharge status: Home / Self Care | Payer: 59 | Source: Ambulatory Visit | Attending: Neurology | Admitting: Neurology

## 2014-08-17 ENCOUNTER — Telehealth: Payer: Self-pay | Admitting: Neurology

## 2014-08-17 DIAGNOSIS — M7138 Other bursal cyst, other site: Secondary | ICD-10-CM | POA: Diagnosis not present

## 2014-08-17 DIAGNOSIS — R471 Dysarthria and anarthria: Secondary | ICD-10-CM

## 2014-08-17 DIAGNOSIS — M4686 Other specified inflammatory spondylopathies, lumbar region: Secondary | ICD-10-CM | POA: Diagnosis not present

## 2014-08-17 DIAGNOSIS — M545 Low back pain: Secondary | ICD-10-CM | POA: Diagnosis present

## 2014-08-17 DIAGNOSIS — R292 Abnormal reflex: Secondary | ICD-10-CM

## 2014-08-17 NOTE — Telephone Encounter (Signed)
-----   Message from Octaviano Battyebecca S Tat, DO sent at 08/17/2014  9:55 AM EST ----- Lesly RubensteinJade, I wanted cervical spine not lumbar.[name]..[name]

## 2014-08-17 NOTE — Telephone Encounter (Signed)
Per Dr Tat - lumbar spine only showed arthritis. Need Cervical Spine MR. Patient aware. Scheduled for 08/26/14 at 12:40 pm. She will call with any questions.

## 2014-08-26 ENCOUNTER — Ambulatory Visit
Admission: RE | Admit: 2014-08-26 | Discharge: 2014-08-26 | Disposition: A | Discharge status: Home / Self Care | Payer: 59 | Source: Ambulatory Visit | Attending: Neurology | Admitting: Neurology

## 2014-08-26 DIAGNOSIS — R292 Abnormal reflex: Secondary | ICD-10-CM

## 2014-08-26 DIAGNOSIS — R471 Dysarthria and anarthria: Secondary | ICD-10-CM

## 2014-08-31 ENCOUNTER — Telehealth: Payer: Self-pay | Admitting: Neurology

## 2014-08-31 ENCOUNTER — Telehealth: Payer: Self-pay | Admitting: *Deleted

## 2014-08-31 NOTE — Telephone Encounter (Signed)
Let pt know that nothing on MRI c-spine to explain sx's.  When is her rheum appt with Dr. Nickola MajorHawkes?  If nothing shows up there, then have her call and will get c/s with Dr Allena KatzPatel to see if she can come up with anything

## 2014-08-31 NOTE — Telephone Encounter (Signed)
left message regarding MRI   results and ask patient to return call to  office

## 2014-09-03 NOTE — Telephone Encounter (Signed)
Called Dr Nickola MajorHawkes office and appt was August 20, 2014. LMOM with medical records to have records from visit faxed to us. Patient was called and made aware of MR results on 08/31/2014 (documented in a separate phone encounter by another clinician).

## 2014-09-03 NOTE — Telephone Encounter (Signed)
Dr. Nickola MajorHawkes' notes forwarded to Dr Tat - do I need to make appt with Dr Allena KatzPatel?

## 2014-09-03 NOTE — Telephone Encounter (Signed)
Left message on machine for patient to call back. To make second opinion appt with Dr Allena KatzPatel if patient interested.

## 2014-09-03 NOTE — Telephone Encounter (Signed)
If she is willing to do a second opinion.

## 2014-09-03 NOTE — Telephone Encounter (Signed)
Traci [name], noticing that this phone call is still open (I know it wasn't sent to you).  Can you just contact Traci [name] again so that please.  Thanks

## 2014-09-05 ENCOUNTER — Ambulatory Visit (INDEPENDENT_AMBULATORY_CARE_PROVIDER_SITE_OTHER): Payer: 59 | Admitting: Family Medicine

## 2014-09-05 ENCOUNTER — Ambulatory Visit (INDEPENDENT_AMBULATORY_CARE_PROVIDER_SITE_OTHER): Payer: 59

## 2014-09-05 VITALS — BP 122/64 | HR 108 | Temp 98.6°F | Resp 19 | Ht 65.0 in | Wt 205.2 lb

## 2014-09-05 DIAGNOSIS — R05 Cough: Secondary | ICD-10-CM

## 2014-09-05 DIAGNOSIS — R0600 Dyspnea, unspecified: Secondary | ICD-10-CM

## 2014-09-05 DIAGNOSIS — R059 Cough, unspecified: Secondary | ICD-10-CM

## 2014-09-05 LAB — POCT CBC
Granulocyte percent: 78.2 %G (ref 37–80)
HCT, POC: 42.1 % (ref 37.7–47.9)
Hemoglobin: 14.1 g/dL (ref 12.2–16.2)
Lymph, poc: 2.1 (ref 0.6–3.4)
MCH, POC: 30.1 pg (ref 27–31.2)
MCHC: 33.5 g/dL (ref 31.8–35.4)
MCV: 89.9 fL (ref 80–97)
MID (cbc): 1.3 — AB (ref 0–0.9)
MPV: 6.2 fL (ref 0–99.8)
POC Granulocyte: 12.3 — AB (ref 2–6.9)
POC LYMPH PERCENT: 13.6 %L (ref 10–50)
POC MID %: 8.2 %M (ref 0–12)
Platelet Count, POC: 314 10*3/uL (ref 142–424)
RBC: 4.68 M/uL (ref 4.04–5.48)
RDW, POC: 14.1 %
WBC: 15.7 10*3/uL — AB (ref 4.6–10.2)

## 2014-09-05 MED ORDER — BENZONATATE 100 MG PO CAPS: 100.0000 mg | ORAL_CAPSULE | Freq: Three times a day (TID) | ORAL | Status: DC | PRN

## 2014-09-05 MED ORDER — ALBUTEROL SULFATE (2.5 MG/3ML) 0.083% IN NEBU
2.5000 mg | INHALATION_SOLUTION | Freq: Once | RESPIRATORY_TRACT | Status: AC
  Administered 2014-09-05: 2.5 mg via RESPIRATORY_TRACT

## 2014-09-05 MED ORDER — PROMETHAZINE-DM 6.25-15 MG/5ML PO SYRP: 5.0000 mL | ORAL_SOLUTION | Freq: Four times a day (QID) | ORAL | Status: AC | PRN

## 2014-09-05 NOTE — Patient Instructions (Signed)
This appears to be more of a viral respiratory illness that is circulating.  We will treat you supportively. Drink plenty of WATER, and try to increase your intake with vegetables and protein. If you are not better in 7-8 days, please contact us to receive an antibiotic.    Upper Respiratory Infection, Adult An upper respiratory infection (URI) is also sometimes known as the common cold. The upper respiratory tract includes the nose, sinuses, throat, trachea, and bronchi. Bronchi are the airways leading to the lungs. Most people improve within 1 week, but symptoms can last up to 2 weeks. A residual cough may last even longer.  CAUSES Many different viruses can infect the tissues lining the upper respiratory tract. The tissues become irritated and inflamed and often become very moist. Mucus production is also common. A cold is contagious. You can easily spread the virus to others by oral contact. This includes kissing, sharing a glass, coughing, or sneezing. Touching your mouth or nose and then touching a surface, which is then touched by another person, can also spread the virus. SYMPTOMS  Symptoms typically develop 1 to 3 days after you come in contact with a cold virus. Symptoms vary from person to person. They may include:  Runny nose.  Sneezing.  Nasal congestion.  Sinus irritation.  Sore throat.  Loss of voice (laryngitis).  Cough.  Fatigue.  Muscle aches.  Loss of appetite.  Headache.  Low-grade fever. DIAGNOSIS  You might diagnose your own cold based on familiar symptoms, since most people get a cold 2 to 3 times a year. Your caregiver can confirm this based on your exam. Most importantly, your caregiver can check that your symptoms are not due to another disease such as strep throat, sinusitis, pneumonia, asthma, or epiglottitis. Blood tests, throat tests, and X-rays are not necessary to diagnose a common cold, but they may sometimes be helpful in excluding other more  serious diseases. Your caregiver will decide if any further tests are required. RISKS AND COMPLICATIONS  You may be at risk for a more severe case of the common cold if you smoke cigarettes, have chronic heart disease (such as heart failure) or lung disease (such as asthma), or if you have a weakened immune system. The very young and very old are also at risk for more serious infections. Bacterial sinusitis, middle ear infections, and bacterial pneumonia can complicate the common cold. The common cold can worsen asthma and chronic obstructive pulmonary disease (COPD). Sometimes, these complications can require emergency medical care and may be life-threatening. PREVENTION  The best way to protect against getting a cold is to practice good hygiene. Avoid oral or hand contact with people with cold symptoms. Wash your hands often if contact occurs. There is no clear evidence that vitamin C, vitamin E, echinacea, or exercise reduces the chance of developing a cold. However, it is always recommended to get plenty of rest and practice good nutrition. TREATMENT  Treatment is directed at relieving symptoms. There is no cure. Antibiotics are not effective, because the infection is caused by a virus, not by bacteria. Treatment may include:  Increased fluid intake. Sports drinks offer valuable electrolytes, sugars, and fluids.  Breathing heated mist or steam (vaporizer or shower).  Eating chicken soup or other clear broths, and maintaining good nutrition.  Getting plenty of rest.  Using gargles or lozenges for comfort.  Controlling fevers with ibuprofen or acetaminophen as directed by your caregiver.  Increasing usage of your inhaler if you have asthma.  Zinc gel and zinc lozenges, taken in the first 24 hours of the common cold, can shorten the duration and lessen the severity of symptoms. Pain medicines may help with fever, muscle aches, and throat pain. A variety of non-prescription medicines are  available to treat congestion and runny nose. Your caregiver can make recommendations and may suggest nasal or lung inhalers for other symptoms.  HOME CARE INSTRUCTIONS   Only take over-the-counter or prescription medicines for pain, discomfort, or fever as directed by your caregiver.  Use a warm mist humidifier or inhale steam from a shower to increase air moisture. This may keep secretions moist and make it easier to breathe.  Drink enough water and fluids to keep your urine clear or pale yellow.  Rest as needed.  Return to work when your temperature has returned to normal or as your caregiver advises. You may need to stay home longer to avoid infecting others. You can also use a face mask and careful hand washing to prevent spread of the virus. SEEK MEDICAL CARE IF:   After the first few days, you feel you are getting worse rather than better.  You need your caregiver's advice about medicines to control symptoms.  You develop chills, worsening shortness of breath, or brown or red sputum. These may be signs of pneumonia.  You develop yellow or brown nasal discharge or pain in the face, especially when you bend forward. These may be signs of sinusitis.  You develop a fever, swollen neck glands, pain with swallowing, or white areas in the back of your throat. These may be signs of strep throat. SEEK IMMEDIATE MEDICAL CARE IF:   You have a fever.  You develop severe or persistent headache, ear pain, sinus pain, or chest pain.  You develop wheezing, a prolonged cough, cough up blood, or have a change in your usual mucus (if you have chronic lung disease).  You develop sore muscles or a stiff neck. Document Released: 02/14/2001 Document Revised: 11/13/2011 Document Reviewed: 11/26/2013 [facility] Patient Information 2015 [place], [place]. This information is not intended to replace advice given to you by your health care provider. Make sure you discuss any questions you have with your  health care provider.

## 2014-09-05 NOTE — Progress Notes (Signed)
MRN: [id number] DOB: 08-24-1956  Subjective:   Traci [name] is a 59 y.o. female presenting for 6 days.  Symptoms began with chills and cough.  The next day she had diarrhea that resolved yesterday.  She has associated fatigue, poor appetite, and sore throat.  The cough is non-productive but she has the sensation as if there is something lodged in her throat.  She states that she has pain, such as tightness, with respirations.  She denies diaphoresis, nausea, vomiting, or palpitations with these symptoms.   She also denies dizziness or vision changes.  There is no fever, besides the first day with chills.  She does not know of any sick contacts.  She has used coritisin for relief, and delsym which worked until yesterday, when she could not resolve her coughing night or day.  She has come into day, with concern because though the symptoms are resolving, coughing has become progressively harder to control with otc meds.    Traci [name] has a current medication list which includes the following prescription(s): aspirin-acetaminophen-caffeine, bupropion, calcium carb-cholecalciferol, fish oil-omega-3 fatty acids, levothyroxine, multivitamin, albuterol, and mederma spf 30.  She is allergic to codeine; meperidine hcl; and simvastatin.  Traci [name]  has a past medical history of Hyperlipidemia; Depression; Headache(784.0); Migraines; Stroke (12/2000); Thyroid disease; Abnormal alkaline phosphatase test; Hypothyroidism; and Arthritis. Also  has past surgical history that includes Laparoscopic gastric banding (2006); Cholecystectomy; and Total knee arthroplasty (Right, 11/26/2012).  ROS As in subjective.  Objective:   Vitals: BP 122/64 mmHg  Pulse 108  Temp(Src) 98.6 F (37 C) (Oral)  Resp 19  Ht  (1.651 m)  Wt 205 lb 3.2 oz (93.078 kg)  BMI 34.15 kg/m2  SpO2 95%  Physical Exam  Constitutional: She is oriented to person, place, and time and well-developed, well-nourished, and in no distress. No  distress.  HENT:  Head: Normocephalic and atraumatic.  Eyes: Conjunctivae are normal. Pupils are equal, round, and reactive to light.  Cardiovascular: Normal rate, regular rhythm and normal heart sounds.  Exam reveals no gallop and no friction rub.   No murmur heard. Pulmonary/Chest: Effort normal and breath sounds normal. No apnea. No respiratory distress. She has no wheezes. She has no rales.  Lymphadenopathy:       Head (right side): No submental, no submandibular, no tonsillar, no preauricular and no posterior auricular adenopathy present.       Head (left side): No submental, no submandibular, no tonsillar, no preauricular and no posterior auricular adenopathy present.       Right cervical: No superficial cervical and no posterior cervical adenopathy present.      Left cervical: No superficial cervical and no posterior cervical adenopathy present.       Right: No supraclavicular adenopathy present.       Left: No supraclavicular adenopathy present.  Neurological: She is alert and oriented to person, place, and time.  Skin: Skin is warm, dry and intact. No rash noted.  Psychiatric: Mood, memory, affect and judgment normal.   Pre-Nebulizing: 300 Post-Nebulizing: 300 Prediction: 445 (67%)  Results for orders placed or performed in visit on 09/05/14  POCT CBC  Result Value Ref Range   WBC 15.7 (A) 4.6 - 10.2 K/uL   Lymph, poc 2.1 0.6 - 3.4   POC LYMPH PERCENT 13.6 10 - 50 %L   MID (cbc) 1.3 (A) 0 - 0.9   POC MID % 8.2 0 - 12 %M   POC Granulocyte 12.3 (A) 2 - 6.9  Granulocyte percent 78.2 37 - 80 %G   RBC 4.68 4.04 - 5.48 M/uL   Hemoglobin 14.1 12.2 - 16.2 g/dL   HCT, POC 04.5 40.9 - 47.9 %   MCV 89.9 80 - 97 fL   MCH, POC 30.1 27 - 31.2 pg   MCHC 33.5 31.8 - 35.4 g/dL   RDW, POC 81.1 %   Platelet Count, POC 314 142 - 424 K/uL   MPV 6.2 0 - 99.8 fL   [facility] reading (PRIMARY) by  Dr. Katrinka Blazing: Negative.  No acute cardiopulmonary findings.    Assessment and Plan :  59 year  old female is here today with a chief complaint of chest congestion, sore throat, and diarrhea.    Dff dx: URI viral/bacterial, influenza pneumonia This is most suspicious of an upper respiratory infection of viral etiology.  I will treat her supportively with cough suppressants, mucinex.  Advised patient to hydrate well.   It is fine to start her on antibiotic to treat respiratory infection if she calls after 4 days with no improvement.  (levaquin).  Dyspnea - Plan: albuterol (PROVENTIL) (2.5 MG/3ML) 0.083% nebulizer solution 2.5 mg, POCT CBC, DG Chest 2 View  Cough - Plan: benzonatate (TESSALON) 100 MG capsule, promethazine-dextromethorphan (PROMETHAZINE-DM) 6.25-15 MG/5ML syrup  Trena Platt, PA-C [facility] 1/2/20163:53 PM

## 2014-09-07 NOTE — Telephone Encounter (Signed)
2nd opinion appt with Dr Allena Katz scheduled with patient.

## 2014-09-08 NOTE — Progress Notes (Signed)
History and physical examinations reviewed in detail with Trena PlattStephanie English, PA-C.  CXR reviewed and WNL. Agree with assessment and plan.

## 2014-09-10 ENCOUNTER — Telehealth: Payer: Self-pay

## 2014-09-10 NOTE — Telephone Encounter (Signed)
Stephanie please advise

## 2014-09-10 NOTE — Telephone Encounter (Signed)
1) Patient is requesting an OOW note through next Monday - she is still in the bed.   2) Also, requesting more cough medicine she ran out.  benzonatate (TESSALON) 100 MG capsule [facility] [address]   Ms. English saw the patient   [phone]

## 2014-09-10 NOTE — Telephone Encounter (Signed)
Please contact the patient and ask if she is just having cough at this point.  If she is, it is fine to refill the tessalon.  At this point, her symptoms should be resolved by tomorrow and she should be ready to work by Monday.  But if she is not able to get out of the bed, she should return to the clinic today through the weekend, for recheck.

## 2014-09-10 NOTE — Telephone Encounter (Signed)
Called and left VM to call back.

## 2014-09-12 NOTE — Telephone Encounter (Signed)
Pt said she spoke with someone already. She is starting to feel a little better today. Will fax work note for her to [phone].

## 2014-09-14 NOTE — Telephone Encounter (Signed)
Fax number has not worked. Called pt, she said she apologized that it was the wrong number. She printed letter off MyChart and gave it to her employer with no problems.

## 2014-09-16 ENCOUNTER — Telehealth: Payer: Self-pay

## 2014-09-16 NOTE — Telephone Encounter (Signed)
Pt will be 59 tomorrow and wants the shingles vaccine. [facility], still congested and not feeling her normal self; pt will wait until she is not sick and will ck with ins co to verify benefits whether to get injection at a pharmacy or doctors office. Pt will cb when well and will ck with ins co also. When pt calls back will send note to Dr Para Marchuncan to verify OK to give vaccine and to either schedule nurse visit or send rx to pharmacy.

## 2014-10-16 ENCOUNTER — Ambulatory Visit (INDEPENDENT_AMBULATORY_CARE_PROVIDER_SITE_OTHER): Payer: 59 | Admitting: Neurology

## 2014-10-16 ENCOUNTER — Encounter: Payer: Self-pay | Admitting: Neurology

## 2014-10-16 VITALS — BP 130/86 | HR 65 | Ht 65.0 in | Wt 211.0 lb

## 2014-10-16 DIAGNOSIS — M5412 Radiculopathy, cervical region: Secondary | ICD-10-CM

## 2014-10-16 DIAGNOSIS — M797 Fibromyalgia: Secondary | ICD-10-CM

## 2014-10-16 DIAGNOSIS — Z789 Other specified health status: Secondary | ICD-10-CM

## 2014-10-16 DIAGNOSIS — R29818 Other symptoms and signs involving the nervous system: Secondary | ICD-10-CM

## 2014-10-16 DIAGNOSIS — R292 Abnormal reflex: Secondary | ICD-10-CM

## 2014-10-16 DIAGNOSIS — R29898 Other symptoms and signs involving the musculoskeletal system: Secondary | ICD-10-CM

## 2014-10-16 MED ORDER — PREGABALIN 50 MG PO CAPS: 50.0000 mg | ORAL_CAPSULE | Freq: Two times a day (BID) | ORAL | Status: DC

## 2014-10-16 NOTE — Patient Instructions (Addendum)
1.  Start physical therapy for arm pain and sorenss 2.  Start Lyrica 50mg  twice daily 3.  Call with an update in 2 weeks to let me know how you are doing so I can send you a new prescription for Lyrica 4.  Return to clinic in 2-3 months

## 2014-10-16 NOTE — Progress Notes (Signed)
[facility] Clinic Note - Initial Visit   Date: 10/17/2014   Traci [name] MRN: [id number] DOB: 10-Apr-1956   Dear [name]:  Thank you for your kind referral of Traci [name] for consultation of bilateral arm soreness. Although her history is well known to you, please allow [place] to reiterate it for the purpose of our medical record. The patient was accompanied to the clinic by self.   History of Present Illness: Traci [name] is a 59 y.o. left-handed Caucasian female with hypothyroidism, depression, and history of lap-band surgery which corrected hypertension and hyperlipidemia presenting for evaluation of bilateral arm discomfort/soreness.    Starting in around early 2014, she developed difficulty writing, described as jerky and scruffy.  She used to have nice penmanship previously. She also feels that fine motor movement is  She denies muscle cramps or hand pain, but she has noticed a mild tremor when trying to write.  No problems with balance, slowed movements, or stiffness.    Her greater complaint is bilateral arm soreness, involving upper arm and forearm.  She keeps having to rub and massage them, because it distracts the discomfort, but does not alleviate it.  Some nights, she even props her arms on pillows to be get comfortable.  She endoses some weakness of the arms.  For instance, she has difficulty carrying an ironing board and now drags it. She cannot carry the vacuum cleaner up the stairs, and therefore bought and extra one to keep upstairs.   She denies any problems of the legs.  She does not have any leg soreness or numbness/tingling.  She denies any problems with speech or swallowing.    Patient was initially evaluated by my collegue, [name] in October for hand writing problems and on that visit, she was also incidental note of spastic speech pattern and because her exam disclosed hyperreflexia, she underwent extensive work-up including  EMG, MRI brain/C-spine/L-spine, and serology testing.  EMG showed bilateral C8 radiculopathy and left L3-4 radiculopathy, which was also evidence on imaging.  Laboratory testing including inflammatory/autoimmune diseases, nutritional deficiencies, myasthenia gravis, and myopathy has been unremarkable (see below).  She also was evaluated by Dr. Trudie Reed in Rheumatology whose work-up was negative.  Of note, in 2002, she had a an episode of acute cognitive problems with slurring of her speech and facial asymmetry and was told she had a TIA.  She has stroke work-up which was negative, except for left ACA stenosis at A1.  Out-side paper records, electronic medical record, and images have been reviewed where available and summarized as:   Labs 06/01/2014:  CK 138, TSH 0.31*, fT4 1.13, ESR 27, MG panel negative, Lyme neg, copper 128, PTH 40, B12 425  Labs 08/20/2014:  CK 115, ANA neg, ESR 23, myositis panel RNP*  EMG 07/22/2014: 1. Chronic C8 radiculopathy affecting bilateral upper extremities, mild in degree electrically. 2. Chronic L3-L4 radiculopathy affecting the left lower extremity, mild in degree electrically. 3. There is no evidence of a generalized sensorimotor polyneuropathy, diffuse myopathy, or widespread disorder of anterior horn cells.  MRI cervical spine wo contrast 08/26/2014: Cervical spondylotic changes most notable C5-6 level followed by the C4-5 level. Summary of pertinent findings include:  C5-6 broad-based disc osteophyte complex greatest right paracentral position with narrowing of the ventral aspect of the thecal sac and very mild cord flattening greater on the right. Minimal foraminal narrowing.  C4-5 small left paracentral disc osteophyte complex with narrowing of the left ventral aspect of  the thecal sac. Minimal left foraminal narrowing.  C7-T1 very mild left-sided facet joint degenerative changes. Bulge slightly greater left paracentral position. Minimal indentation  left ventral aspect of the thecal sac. Minimal left foraminal narrowing.  C3-4 minimal left paracentral bulge.  MRI lumbar spine 10/16/2014: 1. Slight progression of of moderate bilateral facet arthritis at L3-4 with a new synovial cyst on the right with a slight mass effect upon the thecal sac and slight deviation of the right L4 nerve within the thecal sac. 2. Stable facet arthritis at L4-5 on the left and bilaterally at L5-S1.  MRI brain wwo contrast 07/07/2014: Unremarkable appearance of the brain without evidence of acute intracranial abnormality or mass.  Past Medical History  Diagnosis Date  . Hyperlipidemia   . Depression     Previously responded to Wellbutrin  . Headache(784.0)   . Migraines   . Stroke 12/2000  . Thyroid disease     Hypothyroidism  . Abnormal alkaline phosphatase test     with neg eval prev  . Hypothyroidism   . Arthritis     Past Surgical History  Procedure Laterality Date  . Laparoscopic gastric banding  2006  . Cholecystectomy      ? 20 years ago  . Total knee arthroplasty Right 11/26/2012    Procedure: TOTAL KNEE ARTHROPLASTY- right ;  Surgeon: Hessie Dibble, MD;  Location: [facility];  Service: Orthopedics;  Laterality: Right;     Medications:  Current Outpatient Prescriptions on File Prior to Visit  Medication Sig Dispense Refill  . albuterol (PROVENTIL HFA;VENTOLIN HFA) 108 (90 BASE) MCG/ACT inhaler Inhale 1-2 puffs into the lungs as needed (cough).    [name] aspirin-acetaminophen-caffeine (EXCEDRIN MIGRAINE) 250-250-65 MG per tablet Take by mouth every 6 (six) hours as needed for headache.    . benzonatate (TESSALON) 100 MG capsule Take 1-2 capsules (100-200 mg total) by mouth 3 (three) times daily as needed for cough. 40 capsule 0  . buPROPion (WELLBUTRIN SR) 150 MG 12 hr tablet Take 1 tab up to 2 times a day. 60 tablet 5  . Calcium Carb-Cholecalciferol (CALCIUM 1000 + D PO) Take 2,000 Units by mouth daily.    . fish oil-omega-3 fatty acids  1000 MG capsule Take 1 g by mouth daily.      [name] levothyroxine (SYNTHROID, LEVOTHROID) 150 MCG tablet Take 1 tablet (150 mcg total) by mouth daily. Except for 0.5 tab on Sundays and Wednesdays.  Total of 6 pills per 7 days. 90 tablet 3  . Multiple Vitamin (MULTIVITAMIN) tablet Take 1 tablet by mouth daily.     . Scar Treatment Products (Wallis SPF 30) CREA Apply topically.     No current facility-administered medications on file prior to visit.    Allergies:  Allergies  Allergen Reactions  . Codeine Itching  . Meperidine Hcl     syncope  . Simvastatin     REACTION: muscle aches    Family History: Family History  Problem Relation Age of Onset  . Cancer Other     Ovarian, Uterine  . Heart disease Other   . Stroke Mother   . Diabetes Mother   . Heart attack Father     Deceased, 35  . Melanoma Sister   . Healthy Brother     Social History: History   Social History  . Marital Status: Single    Spouse Name: N/A  . Number of Children: 0  . Years of Education: N/A   Occupational History  . Paralegal  Unemployed   Social History Main Topics  . Smoking status: Never Smoker   . Smokeless tobacco: Never Used  . Alcohol Use: No     Comment: "maybe one on new years"  . Drug Use: No  . Sexual Activity: Not on file   Other Topics Concern  . Not on file   Social History Narrative   Regular exercise:  Yes    Works for [employer] level of education:  College, Radio broadcast assistant    Review of Systems:  CONSTITUTIONAL: No fevers, chills, night sweats, or weight loss.   EYES: No visual changes or eye pain ENT: No hearing changes.  No history of nose bleeds.   RESPIRATORY: No cough, wheezing and shortness of breath.   CARDIOVASCULAR: Negative for chest pain, and palpitations.   GI: Negative for abdominal discomfort, blood in stools or black stools.  No recent change in bowel habits.  GU:  No history of incontinence.   MUSCLOSKELETAL: No history of joint pain or  swelling.  +myalgias.   SKIN: Negative for lesions, rash, and itching.   HEMATOLOGY/ONCOLOGY: Negative for prolonged bleeding, bruising easily, and swollen nodes.  No history of cancer.   ENDOCRINE: Negative for cold or heat intolerance, polydipsia or goiter.   PSYCH:  No depression or anxiety symptoms.   NEURO: As Above.   Vital Signs:  BP 130/86 mmHg  Pulse 65  Ht 5' 5" (1.651 m)  Wt 211 lb (95.709 kg)  BMI 35.11 kg/m2  SpO2 97%   General Medical Exam:   General:  Well appearing, comfortable.   Eyes/ENT: see cranial nerve examination.   Neck: No masses appreciated.  Full range of motion without tenderness.  No carotid bruits. Respiratory:  Clear to auscultation, good air entry bilaterally.   Cardiac:  Regular rate and rhythm, no murmur.   Extremities:  No deformities, edema, or skin discoloration.  Skin:  No rashes or lesions.  Neurological Exam: MENTAL STATUS including orientation to time, place, person, recent and remote memory, attention span and concentration, language, and fund of knowledge is normal.  Speech is not dysarthric.  Lingual and guttural sounds are enunciated clearly.  Naming and repetition is intact.  No evidence of apraxia.  Hand writing appears arrhythmic and jerky with mild involuntary hand tremor associated.  CRANIAL NERVES: II:  No visual field defects.  Unremarkable fundi.   III-IV-VI: Pupils equal round and reactive to light.  Normal conjugate, extra-ocular eye movements in all directions of gaze.  No nystagmus.  No ptosis.   V:  Normal facial sensation.  Jaw jerk is absent.   VII:  Normal facial symmetry and movements.  No pathologic facial reflexes.  VIII:  Normal hearing and vestibular function.   IX-X:  Normal palatal movement.   XI:  Normal shoulder shrug and head rotation.   XII:  Normal tongue strength and range of motion, no deviation or fasciculation.  MOTOR: Tenderness to palpation over the forearm muscles a well as 8/18 fibromyalgia  tenderpoints. No atrophy, fasciculations or abnormal movements.  No pronator drift.  Tone is normal.    Right Upper Extremity:    Left Upper Extremity:    Deltoid  5/5   Deltoid  5/5   Biceps  5/5   Biceps  5/5   Triceps  5/5   Triceps  5/5   Wrist extensors  5/5   Wrist extensors  5/5   Wrist flexors  5/5   Wrist flexors  5/5  Finger extensors  5/5   Finger extensors  5/5   Finger flexors  5/5   Finger flexors  5/5   Dorsal interossei  5/5   Dorsal interossei  5/5   Abductor pollicis  5/5   Abductor pollicis  5/5   Tone (Ashworth scale)  0  Tone (Ashworth scale)  0   Right Lower Extremity:    Left Lower Extremity:    Hip flexors  5/5   Hip flexors  5/5   Hip extensors  5/5   Hip extensors  5/5   Knee flexors  5/5   Knee flexors  5/5   Knee extensors  5/5   Knee extensors  5/5   Dorsiflexors  5/5   Dorsiflexors  5/5   Plantarflexors  5/5   Plantarflexors  5/5   Toe extensors  5/5   Toe extensors  5/5   Toe flexors  5/5   Toe flexors  5/5   Tone (Ashworth scale)  0  Tone (Ashworth scale)  0   MSRs:  Right                                                                 Left brachioradialis 3+  brachioradialis 3+  biceps 3+  biceps 3+  triceps 3+  triceps 3+  patellar 3+  patellar 3+  ankle jerk 2+  ankle jerk 2+  Hoffman no  Hoffman no  plantar response down  plantar response down   SENSORY:  Normal and symmetric perception of light touch, pinprick, vibration, and proprioception.  Romberg's sign absent.   COORDINATION/GAIT: Mild dysmetria with finger to nose testing on the left only. Intact rapid alternating movements bilaterally.  Able to rise from a chair without using arms.  Gait narrow based and stable. Tandem and stressed gait intact.    IMPRESSION: Ms. Jambor is a delightful 59 year-old female presenting for second opinion of changes in hand writing and bilateral arm soreness.  Patient has underwent extensive evaluation including EMG of the arm and leg, MRI of the  brain, cervical spine, and lumbar spine, as well as serology testing which has essentially been unremarkable except for cervical and lumbosacral radiculopathy.  Many worrisome disorders including myopathy, motor neuron disease, and neuropathy have been excluded.  She has upper extremity myalgias and she is positive for several fibromyalgia tender spots on her exam, so I feel that it would be at least prudent to offer her a trial of Lyrica.   I am not entirely certain why she is experiencing change in her handwriting and fine motor difficulty. Early manifestation of a neurodegenerative condition such as parkinson-plus syndrome such as corticobasal degeneration is possible, but she has no other clinical features on exam to suggest this.  MRI brain does not show any evidence of intracranial pathology.  Neuropsychological testing may be indicated going forward.  Of note, there is no evidence of speech changes on exam today.  PLAN/RECOMMENDATIONS:  1.  Start physical therapy for arm pain and soreness 2.  Start Lyrica 71m twice daily 3.  Call with an update in 2 weeks  4.  Return to clinic in 2-3 months   The duration of this appointment visit was 50 minutes of face-to-face time with the patient.  Greater than  50% of this time was spent in counseling, explanation of diagnosis, planning of further management, and coordination of care.   Thank you for allowing me to participate in patient's care.  If I can answer any additional questions, I would be pleased to do so.    Sincerely,    Donika K. Posey Pronto, DO

## 2014-10-27 ENCOUNTER — Telehealth: Payer: Self-pay | Admitting: Family Medicine

## 2014-10-27 ENCOUNTER — Emergency Department (HOSPITAL_COMMUNITY)
Admission: EM | Admit: 2014-10-27 | Discharge: 2014-10-28 | Disposition: A | Discharge status: Home / Self Care | Payer: 59 | Attending: Emergency Medicine | Admitting: Emergency Medicine

## 2014-10-27 ENCOUNTER — Telehealth: Payer: Self-pay

## 2014-10-27 ENCOUNTER — Encounter (HOSPITAL_COMMUNITY): Payer: Self-pay | Admitting: Physical Medicine and Rehabilitation

## 2014-10-27 ENCOUNTER — Emergency Department (HOSPITAL_COMMUNITY): Payer: 59

## 2014-10-27 DIAGNOSIS — Z8673 Personal history of transient ischemic attack (TIA), and cerebral infarction without residual deficits: Secondary | ICD-10-CM | POA: Diagnosis not present

## 2014-10-27 DIAGNOSIS — G43909 Migraine, unspecified, not intractable, without status migrainosus: Secondary | ICD-10-CM | POA: Insufficient documentation

## 2014-10-27 DIAGNOSIS — R109 Unspecified abdominal pain: Secondary | ICD-10-CM

## 2014-10-27 DIAGNOSIS — R6883 Chills (without fever): Secondary | ICD-10-CM | POA: Insufficient documentation

## 2014-10-27 DIAGNOSIS — M1990 Unspecified osteoarthritis, unspecified site: Secondary | ICD-10-CM | POA: Diagnosis not present

## 2014-10-27 DIAGNOSIS — R000 Tachycardia, unspecified: Secondary | ICD-10-CM | POA: Diagnosis not present

## 2014-10-27 DIAGNOSIS — Z79899 Other long term (current) drug therapy: Secondary | ICD-10-CM | POA: Insufficient documentation

## 2014-10-27 DIAGNOSIS — K529 Noninfective gastroenteritis and colitis, unspecified: Secondary | ICD-10-CM | POA: Diagnosis not present

## 2014-10-27 DIAGNOSIS — E039 Hypothyroidism, unspecified: Secondary | ICD-10-CM | POA: Diagnosis not present

## 2014-10-27 DIAGNOSIS — R630 Anorexia: Secondary | ICD-10-CM | POA: Diagnosis not present

## 2014-10-27 DIAGNOSIS — F329 Major depressive disorder, single episode, unspecified: Secondary | ICD-10-CM | POA: Diagnosis not present

## 2014-10-27 DIAGNOSIS — R112 Nausea with vomiting, unspecified: Secondary | ICD-10-CM

## 2014-10-27 DIAGNOSIS — Z7982 Long term (current) use of aspirin: Secondary | ICD-10-CM | POA: Insufficient documentation

## 2014-10-27 DIAGNOSIS — R197 Diarrhea, unspecified: Secondary | ICD-10-CM

## 2014-10-27 LAB — CBC WITH DIFFERENTIAL/PLATELET
Basophils Absolute: 0 10*3/uL (ref 0.0–0.1)
Basophils Relative: 0 % (ref 0–1)
Eosinophils Absolute: 0.1 10*3/uL (ref 0.0–0.7)
Eosinophils Relative: 1 % (ref 0–5)
HCT: 41.9 % (ref 36.0–46.0)
Hemoglobin: 13.6 g/dL (ref 12.0–15.0)
Lymphocytes Relative: 16 % (ref 12–46)
Lymphs Abs: 1.1 10*3/uL (ref 0.7–4.0)
MCH: 28.9 pg (ref 26.0–34.0)
MCHC: 32.5 g/dL (ref 30.0–36.0)
MCV: 89.1 fL (ref 78.0–100.0)
Monocytes Absolute: 1 10*3/uL (ref 0.1–1.0)
Monocytes Relative: 13 % — ABNORMAL HIGH (ref 3–12)
Neutro Abs: 5.1 10*3/uL (ref 1.7–7.7)
Neutrophils Relative %: 70 % (ref 43–77)
Platelets: 230 10*3/uL (ref 150–400)
RBC: 4.7 MIL/uL (ref 3.87–5.11)
RDW: 14.4 % (ref 11.5–15.5)
WBC: 7.3 10*3/uL (ref 4.0–10.5)

## 2014-10-27 LAB — URINALYSIS, ROUTINE W REFLEX MICROSCOPIC
Bilirubin Urine: NEGATIVE
Glucose, UA: NEGATIVE mg/dL
Ketones, ur: 15 mg/dL — AB
Leukocytes, UA: NEGATIVE
Nitrite: NEGATIVE
Protein, ur: NEGATIVE mg/dL
Specific Gravity, Urine: 1.029 (ref 1.005–1.030)
Urobilinogen, UA: 0.2 mg/dL (ref 0.0–1.0)
pH: 6 (ref 5.0–8.0)

## 2014-10-27 LAB — COMPREHENSIVE METABOLIC PANEL
ALT: 26 U/L (ref 0–35)
AST: 30 U/L (ref 0–37)
Albumin: 3.8 g/dL (ref 3.5–5.2)
Alkaline Phosphatase: 136 U/L — ABNORMAL HIGH (ref 39–117)
Anion gap: 7 (ref 5–15)
BUN: 17 mg/dL (ref 6–23)
CO2: 22 mmol/L (ref 19–32)
Calcium: 8.8 mg/dL (ref 8.4–10.5)
Chloride: 105 mmol/L (ref 96–112)
Creatinine, Ser: 0.88 mg/dL (ref 0.50–1.10)
GFR calc Af Amer: 81 mL/min — ABNORMAL LOW (ref >90)
GFR calc non Af Amer: 70 mL/min — ABNORMAL LOW (ref >90)
Glucose, Bld: 107 mg/dL — ABNORMAL HIGH (ref 70–99)
Potassium: 3.4 mmol/L — ABNORMAL LOW (ref 3.5–5.1)
Sodium: 134 mmol/L — ABNORMAL LOW (ref 135–145)
Total Bilirubin: 0.8 mg/dL (ref 0.3–1.2)
Total Protein: 7.1 g/dL (ref 6.0–8.3)

## 2014-10-27 LAB — URINE MICROSCOPIC-ADD ON

## 2014-10-27 LAB — LIPASE, BLOOD: Lipase: 22 U/L (ref 11–59)

## 2014-10-27 MED ORDER — SODIUM CHLORIDE 0.9 % IV BOLUS (SEPSIS)
1000.0000 mL | Freq: Once | INTRAVENOUS | Status: AC
  Administered 2014-10-27: 1000 mL via INTRAVENOUS

## 2014-10-27 MED ORDER — FENTANYL CITRATE 0.05 MG/ML IJ SOLN
50.0000 ug | Freq: Once | INTRAMUSCULAR | Status: AC
  Administered 2014-10-27: 50 ug via INTRAVENOUS
  Filled 2014-10-27: qty 2

## 2014-10-27 MED ORDER — ONDANSETRON HCL 4 MG/2ML IJ SOLN
4.0000 mg | Freq: Once | INTRAMUSCULAR | Status: AC
  Administered 2014-10-27: 4 mg via INTRAVENOUS
  Filled 2014-10-27: qty 2

## 2014-10-27 MED ORDER — IOHEXOL 300 MG/ML  SOLN
25.0000 mL | Freq: Once | INTRAMUSCULAR | Status: AC | PRN
  Administered 2014-10-27: 25 mL via ORAL

## 2014-10-27 NOTE — Telephone Encounter (Signed)
Patient Name: Traci SabalMARY Traci [name] DOB: 19-Dec-1955 Initial Comment Caller states she has diarrhea, and abdominal pain. Nurse Assessment Nurse: Elijah Birkaldwell, RN, Lynda Date/Time (Eastern Time): 10/27/2014 11:21:08 AM Confirm and document reason for call. If symptomatic, describe symptoms. ---Caller states she has "projectile, frequent" diarrhea, and abdominal pain. Had dry heaves before. Symptoms started around [place] on Sunday. Has felt hot & cold, no fever now. Symptoms started out of the blue. Has the patient traveled out of the country within the last 30 days? ---Not Applicable Does the patient require triage? ---Yes Related visit to physician within the last 2 weeks? ---No Does the PT have any chronic conditions? (i.e. diabetes, asthma, etc.) ---Yes List chronic conditions. ---thyroid, fibromyalgia Guidelines Guideline Title Affirmed Question Affirmed Notes Diarrhea [1] SEVERE abdominal pain AND [2] age > 6760 Final Disposition User Go to ED Now Elijah Birkaldwell, RN, Stark BrayLynda Comments Caller states she was hoping there was something that could be called in for her for the diarrhea. Nurse advised that she will send details of pt's symptoms to her Dr's office & they may contact her, but nurse is strongly advising to be seen at the ER for this diarrhea. Caller is worried she would not be able to make it to go anywhere without an episode of this. Nurse suggested maybe having her roommate get an adult diaper/Depends to wear for the trip to the hospital in case of an accident. Nurse is concerned about pt's hydration level as she has not been able to take much in the way of fluids.

## 2014-10-27 NOTE — Telephone Encounter (Signed)
Spoke with pt and she has not had dry heaves or nausea since 10/26/14 in AM; pt said abd pain on pain scale is 7-8; pt is not able to drink anything except small amts of apple juice; pt is more tired than usual and pt does experience dizziness on and off; pt said she is afraid she may be dehydrated. Pt notified as instructed by Dr Lianne Bushyuncan's note and [facility]. Pt appreciative.

## 2014-10-27 NOTE — ED Notes (Signed)
MD at bedside. 

## 2014-10-27 NOTE — Telephone Encounter (Signed)
There has a been a GI illness with diarrhea in the community recently.  If severe abd pain or concern for dehydration, then the ER is reasonable.   I wouldn't try imodium for the diarrhea as that could worsen the situation.  If the abd pain is much better now and she has no sx of dehydration, then okay to try zofran prn for nausea if needed.   Okay to call in zofran 4mg  po q8h prn nausea, #20, no rf, assuming she has no sx that would dictate ER disposition.

## 2014-10-27 NOTE — ED Provider Notes (Signed)
CSN: [id number]     Arrival date & time 10/27/14  1535 History   First MD Initiated Contact with Patient 10/27/14 1644     Chief Complaint  Patient presents with  . Emesis  . Abdominal Pain     (Consider location/radiation/quality/duration/timing/severity/associated sxs/prior Treatment) Patient is a 59 y.o. female presenting with vomiting and abdominal pain. The history is provided by the patient.  Emesis Associated symptoms: abdominal pain, chills and diarrhea   Associated symptoms: no headaches   Abdominal Pain Associated symptoms: chills, diarrhea, fatigue, nausea and vomiting   Associated symptoms: no chest pain and no shortness of breath    patient presents with nausea and dry heaves and diarrhea for the last 3 days. Also diffuse crampy abdominal pain. States she's had a decreased appetite. States all she's been able eat was one cracker. States she has been drinking some water but is having issues with it. Mild chills but no fevers. States she was sweating she was having diarrhea. No sick contacts. She is otherwise healthy. No dysuria. No blood in emesis or diarrhea. The pain is diffuse and crampy.  Past Medical History  Diagnosis Date  . Hyperlipidemia   . Depression     Previously responded to Wellbutrin  . Headache(784.0)   . Migraines   . Stroke 12/2000  . Thyroid disease     Hypothyroidism  . Abnormal alkaline phosphatase test     with neg eval prev  . Hypothyroidism   . Arthritis    Past Surgical History  Procedure Laterality Date  . Laparoscopic gastric banding  2006  . Cholecystectomy      ? 20 years ago  . Total knee arthroplasty Right 11/26/2012    Procedure: TOTAL KNEE ARTHROPLASTY- right ;  Surgeon: Velna OchsPeter G Dalldorf, MD;  Location: [facility];  Service: Orthopedics;  Laterality: Right;   Family History  Problem Relation Age of Onset  . Cancer Other     Ovarian, Uterine  . Heart disease Other   . Stroke Mother   . Diabetes Mother   . Heart attack Father     Deceased, 1356  . Melanoma Sister   . Healthy Brother    History  Substance Use Topics  . Smoking status: Never Smoker   . Smokeless tobacco: Never Used  . Alcohol Use: No     Comment: "maybe one on new years"   OB History    No data available     Review of Systems  Constitutional: Positive for chills, appetite change and fatigue. Negative for activity change.  Eyes: Negative for pain.  Respiratory: Negative for chest tightness and shortness of breath.   Cardiovascular: Negative for chest pain and leg swelling.  Gastrointestinal: Positive for nausea, vomiting, abdominal pain and diarrhea.  Genitourinary: Negative for flank pain.  Musculoskeletal: Negative for back pain and neck stiffness.  Skin: Negative for rash.  Neurological: Negative for weakness, numbness and headaches.  Psychiatric/Behavioral: Negative for behavioral problems.      Allergies  Codeine; Meperidine hcl; and Simvastatin  Home Medications   Prior to Admission medications   Medication Sig Start Date End Date Taking? Authorizing Provider  albuterol (PROVENTIL HFA;VENTOLIN HFA) 108 (90 BASE) MCG/ACT inhaler Inhale 1-2 puffs into the lungs as needed (cough). 01/23/14   Joaquim NamGraham S Duncan, MD  aspirin-acetaminophen-caffeine (EXCEDRIN MIGRAINE) [phone] MG per tablet Take by mouth every 6 (six) hours as needed for headache.    Historical Provider, MD  benzonatate (TESSALON) 100 MG capsule Take 1-2 capsules (100-200  mg total) by mouth 3 (three) times daily as needed for cough. 09/05/14   Collie Siad English, PA  buPROPion (WELLBUTRIN SR) 150 MG 12 hr tablet Take 1 tab up to 2 times a day. 11/10/13   Joaquim Nam, MD  Calcium Carb-Cholecalciferol (CALCIUM 1000 + D PO) Take 2,000 Units by mouth daily.    Historical Provider, MD  fish oil-omega-3 fatty acids 1000 MG capsule Take 1 g by mouth daily.      Historical Provider, MD  levothyroxine (SYNTHROID, LEVOTHROID) 150 MCG tablet Take 1 tablet (150 mcg total) by mouth  daily. Except for 0.5 tab on Sundays and Wednesdays.  Total of 6 pills per 7 days. 11/10/13   Joaquim Nam, MD  Multiple Vitamin (MULTIVITAMIN) tablet Take 1 tablet by mouth daily.     Historical Provider, MD  pregabalin (LYRICA) 50 MG capsule Take 1 capsule (50 mg total) by mouth 2 (two) times daily. 10/16/14   Donika Concha Se, DO  Scar Treatment Products (MEDERMA SPF 30) CREA Apply topically.    Historical Provider, MD   BP 135/82 mmHg  Pulse 91  Temp(Src) 97.8 F (36.6 C) (Oral)  Resp 22  SpO2 95% Physical Exam  Constitutional: She appears well-developed.  HENT:  Moist mucous membranes.  Cardiovascular:  Mild tachycardia  Pulmonary/Chest: Effort normal. No respiratory distress.  Abdominal: There is tenderness.  Mild diffuse tenderness without hernias. Hyperactive bowel sounds.  Musculoskeletal: Normal range of motion.  Skin: Skin is warm.    ED Course  Procedures (including critical care time) Labs Review Labs Reviewed  CBC WITH DIFFERENTIAL/PLATELET - Abnormal; Notable for the following:    Monocytes Relative 13 (*)    All other components within normal limits  COMPREHENSIVE METABOLIC PANEL - Abnormal; Notable for the following:    Sodium 134 (*)    Potassium 3.4 (*)    Glucose, Bld 107 (*)    Alkaline Phosphatase 136 (*)    GFR calc non Af Amer 70 (*)    GFR calc Af Amer 81 (*)    All other components within normal limits  URINALYSIS, ROUTINE W REFLEX MICROSCOPIC - Abnormal; Notable for the following:    Hgb urine dipstick TRACE (*)    Ketones, ur 15 (*)    All other components within normal limits  URINE MICROSCOPIC-ADD ON - Abnormal; Notable for the following:    Squamous Epithelial / LPF FEW (*)    All other components within normal limits  LIPASE, BLOOD    Imaging Review No results found.   EKG Interpretation None      MDM   Final diagnoses:  Abdominal pain  Nausea vomiting and diarrhea    Patient with abdominal pain. Nausea vomiting diarrhea.  Has had continued pain. Lab work overall reassuring. Has tolerated some orals. Will get CT scan due to the amount of tenderness. Likely discharge if negative CT scan.    Juliet Rude. Rubin Payor, MD 10/28/14 [id number]

## 2014-10-27 NOTE — ED Notes (Signed)
Pt presents to department for evaluation of nausea/vomiting and diarrhea. Also states decreased appetite and stomach cramps. Onset Friday evening. Denies pain upon arrival to ED. Pt is alert and oriented x4. NAD.

## 2014-10-28 ENCOUNTER — Encounter (HOSPITAL_COMMUNITY): Payer: Self-pay

## 2014-10-28 MED ORDER — IOHEXOL 300 MG/ML  SOLN
100.0000 mL | Freq: Once | INTRAMUSCULAR | Status: AC | PRN
  Administered 2014-10-28: 100 mL via INTRAVENOUS

## 2014-10-28 MED ORDER — ONDANSETRON 8 MG PO TBDP: 8.0000 mg | ORAL_TABLET | Freq: Three times a day (TID) | ORAL | Status: DC | PRN

## 2014-10-28 MED ORDER — DICYCLOMINE HCL 20 MG PO TABS
20.0000 mg | ORAL_TABLET | Freq: Four times a day (QID) | ORAL | Status: DC | PRN
Start: 2014-10-28 — End: 2014-12-25

## 2014-10-28 NOTE — Discharge Instructions (Signed)
Abdominal Pain °Many things can cause abdominal pain. Usually, abdominal pain is not caused by a disease and will improve without treatment. It can often be observed and treated at home. Your health care provider will do a physical exam and possibly order blood tests and X-rays to help determine the seriousness of your pain. However, in many cases, more time must pass before a clear cause of the pain can be found. Before that point, your health care provider may not know if you need more testing or further treatment. °HOME CARE INSTRUCTIONS  °Monitor your abdominal pain for any changes. The following actions may help to alleviate any discomfort you are experiencing: °· Only take over-the-counter or prescription medicines as directed by your health care provider. °· Do not take laxatives unless directed to do so by your health care provider. °· Try a clear liquid diet (broth, tea, or water) as directed by your health care provider. Slowly move to a bland diet as tolerated. °SEEK MEDICAL CARE IF: °· You have unexplained abdominal pain. °· You have abdominal pain associated with nausea or diarrhea. °· You have pain when you urinate or have a bowel movement. °· You experience abdominal pain that wakes you in the night. °· You have abdominal pain that is worsened or improved by eating food. °· You have abdominal pain that is worsened with eating fatty foods. °· You have a fever. °SEEK IMMEDIATE MEDICAL CARE IF:  °· Your pain does not go away within 2 hours. °· You keep throwing up (vomiting). °· Your pain is felt only in portions of the abdomen, such as the right side or the left lower portion of the abdomen. °· You pass bloody or black tarry stools. °MAKE SURE YOU: °· Understand these instructions.   °· Will watch your condition.   °· Will get help right away if you are not doing well or get worse.   °Document Released: 05/31/2005 Document Revised: 08/26/2013 Document Reviewed: 04/30/2013 °ExitCare® Patient Information  ©2015 [employer]. This information is not intended to replace advice given to you by your health care provider. Make sure you discuss any questions you have with your health care provider. ° °Nausea and Vomiting °Nausea is a sick feeling that often comes before throwing up (vomiting). Vomiting is a reflex where stomach contents come out of your mouth. Vomiting can cause severe loss of body fluids (dehydration). Children and elderly adults can become dehydrated quickly, especially if they also have diarrhea. Nausea and vomiting are symptoms of a condition or disease. It is important to find the cause of your symptoms. °CAUSES  °· Direct irritation of the stomach lining. This irritation can result from increased acid production (gastroesophageal reflux disease), infection, food poisoning, taking certain medicines (such as nonsteroidal anti-inflammatory drugs), alcohol use, or tobacco use. °· Signals from the brain. These signals could be caused by a headache, heat exposure, an inner ear disturbance, increased pressure in the brain from injury, infection, a tumor, or a concussion, pain, emotional stimulus, or metabolic problems. °· An obstruction in the gastrointestinal tract (bowel obstruction). °· Illnesses such as diabetes, hepatitis, gallbladder problems, appendicitis, kidney problems, cancer, sepsis, atypical symptoms of a heart attack, or eating disorders. °· Medical treatments such as chemotherapy and radiation. °· Receiving medicine that makes you sleep (general anesthetic) during surgery. °DIAGNOSIS °Your caregiver may ask for tests to be done if the problems do not improve after a few days. Tests may also be done if symptoms are severe or if the reason for the nausea   and vomiting is not clear. Tests may include:  Urine tests.  Blood tests.  Stool tests.  Cultures (to look for evidence of infection).  X-rays or other imaging studies. Test results can help your caregiver make decisions about  treatment or the need for additional tests. TREATMENT You need to stay well hydrated. Drink frequently but in small amounts.You may wish to drink water, sports drinks, clear broth, or eat frozen ice pops or gelatin dessert to help stay hydrated.When you eat, eating slowly may help prevent nausea.There are also some antinausea medicines that may help prevent nausea. HOME CARE INSTRUCTIONS   Take all medicine as directed by your caregiver.  If you do not have an appetite, do not force yourself to eat. However, you must continue to drink fluids.  If you have an appetite, eat a normal diet unless your caregiver tells you differently.  Eat a variety of complex carbohydrates (rice, wheat, potatoes, bread), lean meats, yogurt, fruits, and vegetables.  Avoid high-fat foods because they are more difficult to digest.  Drink enough water and fluids to keep your urine clear or pale yellow.  If you are dehydrated, ask your caregiver for specific rehydration instructions. Signs of dehydration may include:  Severe thirst.  Dry lips and mouth.  Dizziness.  Dark urine.  Decreasing urine frequency and amount.  Confusion.  Rapid breathing or pulse. SEEK IMMEDIATE MEDICAL CARE IF:   You have blood or brown flecks (like coffee grounds) in your vomit.  You have black or bloody stools.  You have a severe headache or stiff neck.  You are confused.  You have severe abdominal pain.  You have chest pain or trouble breathing.  You do not urinate at least once every 8 hours.  You develop cold or clammy skin.  You continue to vomit for longer than 24 to 48 hours.  You have a fever. MAKE SURE YOU:   Understand these instructions.  Will watch your condition.  Will get help right away if you are not doing well or get worse. Document Released: 08/21/2005 Document Revised: 11/13/2011 Document Reviewed: 01/18/2011 [facility] Patient Information 2015 [place], [place]. This information is not  intended to replace advice given to you by your health care provider. Make sure you discuss any questions you have with your health care provider.  Viral Gastroenteritis Viral gastroenteritis is also called stomach flu. This illness is caused by a certain type of germ (virus). It can cause sudden watery poop (diarrhea) and throwing up (vomiting). This can cause you to lose body fluids (dehydration). This illness usually lasts for 3 to 8 days. It usually goes away on its own. HOME CARE   Drink enough fluids to keep your pee (urine) clear or pale yellow. Drink small amounts of fluids often.  Ask your doctor how to replace body fluid losses (rehydration).  Avoid:  Foods high in sugar.  Alcohol.  Bubbly (carbonated) drinks.  Tobacco.  Juice.  Caffeine drinks.  Very hot or cold fluids.  Fatty, greasy foods.  Eating too much at one time.  Dairy products until 24 to 48 hours after your watery poop stops.  You may eat foods with active cultures (probiotics). They can be found in some yogurts and supplements.  Wash your hands well to avoid spreading the illness.  Only take medicines as told by your doctor. Do not give aspirin to children. Do not take medicines for watery poop (antidiarrheals).  Ask your doctor if you should keep taking your regular medicines.  Keep all doctor visits as told. GET HELP RIGHT AWAY IF:   You cannot keep fluids down.  You do not pee at least once every 6 to 8 hours.  You are short of breath.  You see blood in your poop or throw up. This may look like coffee grounds.  You have belly (abdominal) pain that gets worse or is just in one small spot (localized).  You keep throwing up or having watery poop.  You have a fever.  The patient is a child younger than 3 months, and he or she has a fever.  The patient is a child older than 3 months, and he or she has a fever and problems that do not go away.  The patient is a child older than 3  months, and he or she has a fever and problems that suddenly get worse.  The patient is a baby, and he or she has no tears when crying. MAKE SURE YOU:   Understand these instructions.  Will watch your condition.  Will get help right away if you are not doing well or get worse. Document Released: 02/07/2008 Document Revised: 11/13/2011 Document Reviewed: 06/07/2011 [facility] Patient Information 2015 [place]. This information is not intended to replace advice given to you by your health care provider. Make sure you discuss any questions you have with your health care provider.

## 2014-10-28 NOTE — ED Provider Notes (Signed)
Care assumed from Dr. Rubin Payor at change of shift.  Patient with 3 days of nausea, vomiting and diarrhea.  Patient was awaiting CT scan for abdominal tenderness.  Mild enteritis seen.  Patient otherwise stable for discharge   Results for orders placed or performed during the hospital encounter of 10/27/14  CBC with Differential  Result Value Ref Range   WBC 7.3 4.0 - 10.5 K/uL   RBC 4.70 3.87 - 5.11 MIL/uL   Hemoglobin 13.6 12.0 - 15.0 g/dL   HCT 16.1 09.6 - 04.5 %   MCV 89.1 78.0 - 100.0 fL   MCH 28.9 26.0 - 34.0 pg   MCHC 32.5 30.0 - 36.0 g/dL   RDW 40.9 81.1 - 91.4 %   Platelets 230 150 - 400 K/uL   Neutrophils Relative % 70 43 - 77 %   Neutro Abs 5.1 1.7 - 7.7 K/uL   Lymphocytes Relative 16 12 - 46 %   Lymphs Abs 1.1 0.7 - 4.0 K/uL   Monocytes Relative 13 (H) 3 - 12 %   Monocytes Absolute 1.0 0.1 - 1.0 K/uL   Eosinophils Relative 1 0 - 5 %   Eosinophils Absolute 0.1 0.0 - 0.7 K/uL   Basophils Relative 0 0 - 1 %   Basophils Absolute 0.0 0.0 - 0.1 K/uL  Comprehensive metabolic panel  Result Value Ref Range   Sodium 134 (L) 135 - 145 mmol/L   Potassium 3.4 (L) 3.5 - 5.1 mmol/L   Chloride 105 96 - 112 mmol/L   CO2 22 19 - 32 mmol/L   Glucose, Bld 107 (H) 70 - 99 mg/dL   BUN 17 6 - 23 mg/dL   Creatinine, Ser 7.82 0.50 - 1.10 mg/dL   Calcium 8.8 8.4 - 95.6 mg/dL   Total Protein 7.1 6.0 - 8.3 g/dL   Albumin 3.8 3.5 - 5.2 g/dL   AST 30 0 - 37 U/L   ALT 26 0 - 35 U/L   Alkaline Phosphatase 136 (H) 39 - 117 U/L   Total Bilirubin 0.8 0.3 - 1.2 mg/dL   GFR calc non Af Amer 70 (L) >90 mL/min   GFR calc Af Amer 81 (L) >90 mL/min   Anion gap 7 5 - 15  Lipase, blood  Result Value Ref Range   Lipase 22 11 - 59 U/L  Urinalysis, Routine w reflex microscopic  Result Value Ref Range   Color, Urine YELLOW YELLOW   APPearance CLEAR CLEAR   Specific Gravity, Urine 1.029 1.005 - 1.030   pH 6.0 5.0 - 8.0   Glucose, UA NEGATIVE NEGATIVE mg/dL   Hgb urine dipstick TRACE (A) NEGATIVE   Bilirubin Urine NEGATIVE NEGATIVE   Ketones, ur 15 (A) NEGATIVE mg/dL   Protein, ur NEGATIVE NEGATIVE mg/dL   Urobilinogen, UA 0.2 0.0 - 1.0 mg/dL   Nitrite NEGATIVE NEGATIVE   Leukocytes, UA NEGATIVE NEGATIVE  Urine microscopic-add on  Result Value Ref Range   Squamous Epithelial / LPF FEW (A) RARE   WBC, UA 0-2 <3 WBC/hpf   RBC / HPF 0-2 <3 RBC/hpf   Bacteria, UA RARE RARE   Urine-Other MUCOUS PRESENT    Ct Abdomen Pelvis W Contrast  10/28/2014   CLINICAL DATA:  Mid abdominal pain extending into both sides. Severe diarrhea. Nausea. Symptoms since Sunday.  EXAM: CT ABDOMEN AND PELVIS WITH CONTRAST  TECHNIQUE: Multidetector CT imaging of the abdomen and pelvis was performed using the standard protocol following bolus administration of intravenous contrast.  CONTRAST:   OMNIPAQUE IOHEXOL 300 MG/ML  SOLN  COMPARISON:  09/21/2004  FINDINGS: Atelectasis in the lung bases.  Surgical absence of the gallbladder. The liver, spleen, pancreas, adrenal glands, kidneys, abdominal aorta, inferior vena cava, and retroperitoneal lymph nodes are unremarkable. Gastric lap band. Lap band demonstrates normal angle with small pouch above the lap band. Contrast material flows through to the stomach without evidence of obstruction. Mild dilatation of proximal small bowel with some decompression of distal loops. Contrast material flows through without evidence of complete obstruction. This may represent localized ileus or enteritis. : Is not abnormally distended and no wall thickening is appreciated. No free air or free fluid in the abdomen.  Pelvis: Bladder wall is not thickened. Uterus and ovaries are not enlarged. Small amount of free fluid in the pelvis is likely physiologic. Appendix is normal. No pelvic mass or lymphadenopathy. No destructive bone lesions. Mild degenerative changes in the spine.  IMPRESSION: Mild nonspecific dilatation of proximal small bowel with contrast extending throughout. Changes may  represent mild ileus or enteritis. No wall thickening. Gastric lap band appears in place. Small amount of free fluid in the pelvis is likely physiologic.   Electronically Signed   By: Burman NievesWilliam  Stevens M.D.   On: 10/28/2014 00:38      Olivia Mackielga M Elzia Hott, MD 10/28/14 [phone]

## 2014-10-28 NOTE — Telephone Encounter (Signed)
Agreed.  Thanks.  I saw ER note.  Was discharged, didn't need admission.

## 2014-10-28 NOTE — ED Notes (Signed)
Pt a/o x 4 on d/c with steady gait. Pt refused wheelchair. 

## 2014-10-29 NOTE — Telephone Encounter (Signed)
error 

## 2014-10-30 ENCOUNTER — Encounter: Payer: Self-pay | Admitting: Family Medicine

## 2014-10-30 ENCOUNTER — Ambulatory Visit (INDEPENDENT_AMBULATORY_CARE_PROVIDER_SITE_OTHER): Payer: 59 | Admitting: Family Medicine

## 2014-10-30 VITALS — BP 130/88 | HR 83 | Temp 98.5°F | Wt 205.0 lb

## 2014-10-30 DIAGNOSIS — R197 Diarrhea, unspecified: Secondary | ICD-10-CM

## 2014-10-30 NOTE — Patient Instructions (Signed)
Keep washing your hands and drinking plenty of fluids.   Try to get some rest.  Avoid dairy for now.  This should gradually get better.

## 2014-10-30 NOTE — Progress Notes (Signed)
Pre visit review using our clinic review tool, if applicable. No additional management support is needed unless otherwise documented below in the visit note.  Recently started on lyrica.  Has PT pending for arm exercises.  Neg w/u with neuro so far.  D/w pt.    GI sx started about 5-6 days ago.  Prominent diarrhea.  No blood in stool.  Sig abd pain.  Concerned about dehydration, to ER.  CT done, given IV fluids.  Labs unremarkable.  Sent home with zofran and bentyl.  Still with some diarrhea and urgency, but less frequency.  Vomited this AM, no blood in vomit, that may have been from taking some dairy this AM.  No fevers now, but had sweats initially.  She feels better, "not 100% and still tired" but better.  Less abd pain now, still diffusely across the abd.  She prev had really loud rumbling in her abd, improved now.    Meds, vitals, and allergies reviewed.   ROS: See HPI.  Otherwise, noncontributory.  GEN: nad, alert and oriented HEENT: mucous membranes moist NECK: supple w/o LA CV: rrr PULM: ctab, no inc wob ABD: soft, +bs EXT: no edema SKIN: no acute rash

## 2014-11-01 DIAGNOSIS — R197 Diarrhea, unspecified: Secondary | ICD-10-CM | POA: Insufficient documentation

## 2014-11-01 NOTE — Assessment & Plan Note (Signed)
Improved, ddx d/w pt.  There was a GI illness in the community recently.  She could have had a bad episode of that, with profound sx.  D/w pt.  Okay for outpatient f/u.   Continue current meds for now.  Not dehydrated appearing. This isn't likely med related with the lyrica start.  I would still continue that.  She agrees.  F/u prn.

## 2014-11-12 ENCOUNTER — Other Ambulatory Visit: Payer: Self-pay | Admitting: *Deleted

## 2014-11-12 ENCOUNTER — Telehealth: Payer: Self-pay | Admitting: Neurology

## 2014-11-12 MED ORDER — PREGABALIN 50 MG PO CAPS: 50.0000 mg | ORAL_CAPSULE | Freq: Two times a day (BID) | ORAL | Status: DC

## 2014-11-12 NOTE — Telephone Encounter (Signed)
Rx faxed

## 2014-11-12 NOTE — Telephone Encounter (Signed)
[facility]   Pharmacy: [facility] on [address] [phone]

## 2014-11-23 ENCOUNTER — Telehealth: Payer: Self-pay

## 2014-11-23 NOTE — Telephone Encounter (Signed)
Pt left v/m requesting cb about receiving a shingles vaccine. Left v/m for pt to cb.

## 2014-11-23 NOTE — Telephone Encounter (Signed)
Pt left v/m requesting cb; left v/m for pt to cb.

## 2014-11-24 ENCOUNTER — Encounter: Payer: Self-pay | Admitting: Physical Therapy

## 2014-11-24 ENCOUNTER — Ambulatory Visit: Payer: 59 | Attending: Neurology | Admitting: Physical Therapy

## 2014-11-24 DIAGNOSIS — M542 Cervicalgia: Secondary | ICD-10-CM | POA: Diagnosis present

## 2014-11-24 DIAGNOSIS — R29898 Other symptoms and signs involving the musculoskeletal system: Secondary | ICD-10-CM | POA: Diagnosis not present

## 2014-11-24 DIAGNOSIS — M79629 Pain in unspecified upper arm: Secondary | ICD-10-CM | POA: Diagnosis not present

## 2014-11-24 DIAGNOSIS — M25529 Pain in unspecified elbow: Secondary | ICD-10-CM

## 2014-11-24 NOTE — Therapy (Signed)
[facility] [address] [place], [place], [address] Phone: [phone]   Fax:  [phone]  Physical Therapy Evaluation  Patient Details  Name: Traci [name] MRN: [id number] Date of Birth: 1956/01/13 Referring Provider:  Glendale ChardPatel, Donika K, DO  Encounter Date: 11/24/2014      PT End of Session - 11/24/14 2210    Visit Number 1   Number of Visits 16   Date for PT Re-Evaluation 01/19/15   PT Start Time 1506   PT Stop Time 1555   PT Time Calculation (min) 49 min   Activity Tolerance Patient tolerated treatment well      Past Medical History  Diagnosis Date  . Hyperlipidemia   . Depression     Previously responded to Wellbutrin  . Headache(784.0)   . Migraines   . Stroke 12/2000  . Thyroid disease     Hypothyroidism  . Abnormal alkaline phosphatase test     with neg eval prev  . Hypothyroidism   . Arthritis     Past Surgical History  Procedure Laterality Date  . Laparoscopic gastric banding  2006  . Cholecystectomy      ? 20 years ago  . Total knee arthroplasty Right 11/26/2012    Procedure: TOTAL KNEE ARTHROPLASTY- right ;  Surgeon: Velna OchsPeter G Dalldorf, MD;  Location: [facility] OR;  Service: Orthopedics;  Laterality: Right;    There were no vitals filed for this visit.  Visit Diagnosis:  Muscle pain, cervical  Pain in joint, upper arm, unspecified laterality  Weakness of both arms      Subjective Assessment - 11/24/14 1511    Symptoms Patient reports with decreased UE function and pain in arms, weakness, soreness with palpation. She has diff carrying itmes, lifting files at work, assiting her roommate physically, housework.     Pertinent History fibromyalgia, history of CVA, OA, R TKR   Limitations Lifting;Writing;House hold activities;Other (comment);Sitting  Driving long distances, sleep   How long can you sit comfortably? Does not incr pain, needs pillows   How long can you stand comfortably? Limited by LBP    How long can you walk comfortably? LBP   Diagnostic tests MRI of C spine, DDD and spondylosis   Patient Stated Goals to be able to do basic activities without pain   Currently in Pain? Yes   Pain Score 5    Pain Location Arm   Pain Orientation Right;Left;Anterior;Proximal   Pain Descriptors / Indicators Aching;Tiring;Tightness;Numbness  Lt hand tingles   Pain Type Chronic pain  >1 yr   Pain Radiating Towards lower arms, hands   Pain Onset More than a month ago   Pain Frequency Constant   Aggravating Factors  using arms   Pain Relieving Factors aspirin, pillows to prop   Effect of Pain on Daily Activities writing, less pain, mow the grass   Multiple Pain Sites Yes   Pain Type Chronic pain   Pain Location Back   Pain Orientation Lower            [facility] PT Assessment - 11/24/14 1523    Assessment   Medical Diagnosis cervical radiculopathy   Onset Date --  6 mos   Next MD Visit unknown   Prior Therapy No   Precautions   Precautions None   Restrictions   Weight Bearing Restrictions No   Balance Screen   Has the patient fallen in the past 6 months No   Has the patient had a decrease in activity  level because of a fear of falling?  No   Is the patient reluctant to leave their home because of a fear of falling?  No   Home Environment   Living Enviornment Private residence   Living Arrangements Non-relatives/Friends   Cognition   Overall Cognitive Status Within Functional Limits for tasks assessed   Sensation   Light Touch Appears Intact   Posture/Postural Control   Posture/Postural Control Postural limitations   Postural Limitations Rounded Shoulders;Forward head;Increased thoracic kyphosis   AROM   Right/Left Shoulder --  [facility]   Cervical Flexion 40   Cervical Extension 37   Cervical - Right Side Bend 24   Cervical - Left Side Bend 22   Cervical - Right Rotation WFL  tightness   Cervical - Left Rotation WFL  tightness   Strength   Right/Left Shoulder --  Rt.  and Lt. rhomboids 3+/5 and extension 3+/5   Right Shoulder Flexion 4/5   Right Shoulder ABduction 4-/5   Left Shoulder Flexion 4/5   Left Shoulder ABduction 4-/5   Palpation   Palpation Sore/pain in suboccipitals, R>L, Rt upper traps and levator scap   Special Tests    Special Tests Cervical   Cervical Tests Spurling's;Dictraction   Spurling's   Findings Negative   Side --  bilat.   Distraction Test   Findngs Negative   side --  supine   Comment pain at point of pull (PT hands)       HEP for scap retraction, corner stretch, Heat vs ice, posture     [facility] Adult PT Treatment/Exercise - 11/25/14 0758    Neck Exercises: Standing   Other Standing Exercises scapular retraction HEP   Neck Exercises: Stretches   Corner Stretch 2 reps;30 seconds     MHP in sitting for C spine, 10 min     PT Education - 11/24/14 2209    Education provided Yes   Education Details PT/POC, HEP, posture, heat   Person(s) Educated Patient   Methods Explanation;Demonstration;Handout   Comprehension Verbal cues required;Returned demonstration;Verbalized understanding          PT Short Term Goals - 11/24/14 2216    PT SHORT TERM GOAL #1   Title Pt will be I with HEP for C- spine, posture   Time 4   Period Weeks   Status New   PT SHORT TERM GOAL #2   Title Pt will demo corrected posture and maintain with UE exercise   Time 4   Period Weeks   Status New   PT SHORT TERM GOAL #3   Title Pt will be able to report improved comfort at night in arms, need for less pilliows positioning.    Time 4   Period Weeks   Status New           PT Long Term Goals - 11/24/14 2218    PT LONG TERM GOAL #1   Title Pt will be I with advanced HEP   Time 8   Period Weeks   Status New   PT LONG TERM GOAL #2   Title PT will score  <35% on FOTO to demo improved UE function   Time 8   Period Weeks   Status New   PT LONG TERM GOAL #3   Title Pt will be able to demo UE strength to 4/5 or more for  improved lifting/work   Time 8   Period Weeks   Status New   PT LONG TERM GOAL #4  Title Pt will be able to report min UE symptoms (<2/10) with housework/assisting friend   Time 8   Period Weeks   Status New               Plan - 11/24/14 2210    Clinical Impression Statement Ms. Skilton presents with generalized fatigue, trigger points and postural imbalance which may cause exacerbation of cervical spondylosis.  She will benefit from posture retraining, C stab and modalities for pain to reduce discomfort in UEs.    Pt will benefit from skilled therapeutic intervention in order to improve on the following deficits Decreased endurance;Decreased mobility;Impaired sensation;Pain;Improper body mechanics;Decreased range of motion;Decreased activity tolerance;Decreased strength;Increased fascial restricitons;Impaired flexibility;Impaired UE functional use;Postural dysfunction   Rehab Potential Good   PT Frequency 2x / week  She may only be able to do 1 time due to work   PT Duration 8 weeks   PT Treatment/Interventions Statistician;Therapeutic exercise;Patient/family education;Moist Heat;Traction;Functional mobility training;Neuromuscular re-education;Manual techniques;Passive range of motion;Dry needling;Therapeutic activities;Ultrasound;Cryotherapy   PT Next Visit Plan check HEP (corner stretch, scap retract) and progress posture training, manual, heat   PT Home Exercise Plan see above   Consulted and Agree with Plan of Care Patient          G-Codes - 2014-12-23 0757    Functional Assessment Tool Used FOTO   Functional Limitation Carrying, moving and handling objects   Carrying, Moving and Handling Objects Current Status [phone]) At least 40 percent but less than 60 percent impaired, limited or restricted   Carrying, Moving and Handling Objects Goal Status (U0454) At least 20 percent but less than 40 percent impaired, limited or restricted       Problem List Patient  Active Problem List   Diagnosis Date Noted  . Diarrhea 11/01/2014  . Poor fine motor skills 06/02/2014  . Cough 01/27/2014  . Rash and nonspecific skin eruption 02/06/2013  . Right knee DJD 11/26/2012    Class: Chronic  . Hoarseness of voice 10/08/2012  . Leg edema 09/20/2012  . Knee pain 08/24/2012  . Osteopenia 08/04/2012  . Work-related stress 03/18/2012  . Routine general medical examination at a health care facility 07/30/2011  . Back pain 06/08/2011  . UNSPECIFIED HYPOTHYROIDISM 04/06/2010  . HYPERLIPIDEMIA 04/06/2010  . DEPRESSION 04/06/2010  . Myalgia and myositis 04/06/2010  . HEADACHE 04/06/2010  . CEREBROVASCULAR ACCIDENT, HX OF 04/06/2010  . MIGRAINES, HX OF 04/06/2010    Lumir Demetriou 12/23/2014, 7:59 AM  [facility] [address] [place], [place], [address] Phone: [phone]   Fax:  [phone]

## 2014-11-24 NOTE — Patient Instructions (Signed)
Posture Tips DO: - stand tall and erect - keep chin tucked in - keep head and shoulders in alignment - check posture regularly in mirror or large window - pull head back against headrest in car seat;  Change your position often.  Sit with lumbar support. DON'T: - slouch or slump while watching TV or reading - sit, stand or lie in one position  for too long;  Sitting is especially hard on the spine so if you sit at a desk/use the computer, then stand up often!   Copyright  VHI. All rights reserved.  Posture - Standing   Good posture is important. Avoid slouching and forward head thrust. Maintain curve in low back and align ears over shoul- ders, hips over ankles.  Pull your belly button in toward your back bone.   Copyright  VHI. All rights reserved.  Posture - Sitting   Sit upright, head facing forward. Try using a roll to support lower back. Keep shoulders relaxed, and avoid rounded back. Keep hips level with knees. Avoid crossing legs for long periods.   Copyright  VHI. All rights reserved.    Scapular Retraction (Standing)   With arms at sides, pinch shoulder blades together. Repeat ___10-20_ times per set. Do __2__ sets per session. Do __2-3__ sessions per day.  http://orth.exer.us/945   Copyright  VHI. All rights reserved.  Stand in the corner and lean into the wall

## 2014-11-25 DIAGNOSIS — M542 Cervicalgia: Secondary | ICD-10-CM | POA: Diagnosis not present

## 2014-11-25 NOTE — Telephone Encounter (Signed)
Left v/m requesting pt cb. 

## 2014-11-25 NOTE — Telephone Encounter (Signed)
Pt has checked with ins co and pt wants to [facility]; pt said she is doing well now; pt scheduled with nurse visit on 12/02/14 at 4 pm. Pt does not need cb unless Dr Para Marchuncan does not want pt to have shingles vaccine.

## 2014-11-26 ENCOUNTER — Other Ambulatory Visit: Payer: Self-pay | Admitting: Family Medicine

## 2014-11-26 NOTE — Telephone Encounter (Signed)
Okay to do.  Form signed for order.  Thanks.

## 2014-12-02 ENCOUNTER — Ambulatory Visit (INDEPENDENT_AMBULATORY_CARE_PROVIDER_SITE_OTHER): Payer: 59 | Admitting: *Deleted

## 2014-12-02 DIAGNOSIS — Z23 Encounter for immunization: Secondary | ICD-10-CM | POA: Diagnosis not present

## 2014-12-09 ENCOUNTER — Ambulatory Visit: Payer: 59 | Admitting: Physical Therapy

## 2014-12-16 ENCOUNTER — Ambulatory Visit: Payer: 59 | Admitting: Physical Therapy

## 2014-12-23 ENCOUNTER — Ambulatory Visit: Payer: 59 | Attending: Neurology | Admitting: Physical Therapy

## 2014-12-23 DIAGNOSIS — R29898 Other symptoms and signs involving the musculoskeletal system: Secondary | ICD-10-CM | POA: Insufficient documentation

## 2014-12-23 DIAGNOSIS — M542 Cervicalgia: Secondary | ICD-10-CM | POA: Diagnosis present

## 2014-12-23 DIAGNOSIS — M79629 Pain in unspecified upper arm: Secondary | ICD-10-CM | POA: Insufficient documentation

## 2014-12-23 DIAGNOSIS — M25529 Pain in unspecified elbow: Secondary | ICD-10-CM

## 2014-12-23 NOTE — Therapy (Signed)
[facility] [address] [place], [place], [address] Phone: [phone]   Fax:  [phone]  Physical Therapy Treatment  Patient Details  Name: [name] MRN: [id number] Date of Birth: 06-25-1956 Referring Provider:  Joaquim Namuncan, Graham S, MD  Encounter Date: 12/23/2014      PT End of Session - 12/23/14 1735    Visit Number 2   Number of Visits 16   Date for PT Re-Evaluation 01/19/15   PT Start Time 1505   PT Stop Time 1548   PT Time Calculation (min) 43 min   Activity Tolerance Patient tolerated treatment well      Past Medical History  Diagnosis Date  . Hyperlipidemia   . Depression     Previously responded to Wellbutrin  . Headache(784.0)   . Migraines   . Stroke 12/2000  . Thyroid disease     Hypothyroidism  . Abnormal alkaline phosphatase test     with neg eval prev  . Hypothyroidism   . Arthritis     Past Surgical History  Procedure Laterality Date  . Laparoscopic gastric banding  2006  . Cholecystectomy      ? 20 years ago  . Total knee arthroplasty Right 11/26/2012    Procedure: TOTAL KNEE ARTHROPLASTY- right ;  Surgeon: Velna OchsPeter G Dalldorf, MD;  Location: [facility];  Service: Orthopedics;  Laterality: Right;    There were no vitals filed for this visit.  Visit Diagnosis:  Muscle pain, cervical  Pain in joint, upper arm, unspecified laterality  Weakness of both arms      Subjective Assessment - 12/23/14 1549    Subjective Doing her home exercises, able to get files out with less pain.  Missed last session due to meeting out of town and the next week her roommate died.  Needs PT 1 X a week due to financial reasons.   Currently in Pain? Yes   Pain Score 5    Pain Orientation Left;Right;Proximal   Pain Type Chronic pain   Pain Radiating Towards lowers arms, Lt hand , middle finger.   Aggravating Factors  not sure   Pain Relieving Factors pillows, rubbing arms   Multiple Pain Sites No                          [facility] Adult PT Treatment/Exercise - 12/23/14 1510    Posture/Postural Control   Posture Comments cues, suggestions   Neck Exercises: Standing   Neck Retraction 5 reps   Other Standing Exercises --   Other Standing Exercises Deep neck flexors 5 second holds added to home exercise.   Shoulder Exercises: Standing   Row Both;10 reps;Theraband   Row Limitations red band, instructions   Retraction --  5 reps practiced   Manual Therapy   Manual Therapy --  very light Lt rhomboids 7/10 also myofascial stretch upper b                PT Education - 12/23/14 1734    Education provided Yes   Education Details Deep Neck flexors, row  Posture education continued   Person(s) Educated Patient   Methods Explanation;Demonstration;Handout   Comprehension Verbalized understanding;Returned demonstration          PT Short Term Goals - 12/23/14 1745    PT SHORT TERM GOAL #1   Title Pt will be I with HEP for C- spine, posture   Time 4   Period Weeks   Status  Achieved   PT SHORT TERM GOAL #2   Title Pt will demo corrected posture and maintain with UE exercise   Time 4   Period Weeks   Status On-going   PT SHORT TERM GOAL #3   Title Pt will be able to report improved comfort at night in arms, need for less pilliows positioning.    Baseline still uses pillows   Time 4   Period Weeks   Status On-going           PT Long Term Goals - 11/24/14 2218    PT LONG TERM GOAL #1   Title Pt will be I with advanced HEP   Time 8   Period Weeks   Status New   PT LONG TERM GOAL #2   Title PT will score  <35% on FOTO to demo improved UE function   Time 8   Period Weeks   Status New   PT LONG TERM GOAL #3   Title Pt will be able to demo UE strength to 4/5 or more for improved lifting/work   Time 8   Period Weeks   Status New   PT LONG TERM GOAL #4   Title Pt will be able to report min UE symptoms (<2/10) with housework/assisting friend   Time  8   Period Weeks   Status New               Plan - 12/23/14 1738    Clinical Impression Statement Patient now lives alone and has extra stress.  Tissues upper back very sensitive  arm pain intermittant, sometimes it was centralized.   PT Next Visit Plan Decompression, bands for shoulders in supine   Consulted and Agree with Plan of Care Patient        Problem List Patient Active Problem List   Diagnosis Date Noted  . Diarrhea 11/01/2014  . Poor fine motor skills 06/02/2014  . Cough 01/27/2014  . Rash and nonspecific skin eruption 02/06/2013  . Right knee DJD 11/26/2012    Class: Chronic  . Hoarseness of voice 10/08/2012  . Leg edema 09/20/2012  . Knee pain 08/24/2012  . Osteopenia 08/04/2012  . Work-related stress 03/18/2012  . Routine general medical examination at a health care facility 07/30/2011  . Back pain 06/08/2011  . UNSPECIFIED HYPOTHYROIDISM 04/06/2010  . HYPERLIPIDEMIA 04/06/2010  . DEPRESSION 04/06/2010  . Myalgia and myositis 04/06/2010  . HEADACHE 04/06/2010  . CEREBROVASCULAR ACCIDENT, HX OF 04/06/2010  . MIGRAINES, HX OF 04/06/2010  Liz Beach, PTA 12/23/2014 5:47 PM Phone: [phone] Fax: [phone]   [facility] 12/23/2014, 5:47 PM  [facility] [address] [place], [place], [address] Phone: [phone]   Fax:  [phone]

## 2014-12-25 ENCOUNTER — Encounter: Payer: Self-pay | Admitting: Neurology

## 2014-12-25 ENCOUNTER — Encounter: Payer: Self-pay | Admitting: Internal Medicine

## 2014-12-25 ENCOUNTER — Ambulatory Visit (INDEPENDENT_AMBULATORY_CARE_PROVIDER_SITE_OTHER): Payer: 59 | Admitting: Neurology

## 2014-12-25 VITALS — BP 110/84 | HR 88 | Ht 65.0 in | Wt 207.4 lb

## 2014-12-25 DIAGNOSIS — Z789 Other specified health status: Secondary | ICD-10-CM | POA: Diagnosis not present

## 2014-12-25 DIAGNOSIS — R29898 Other symptoms and signs involving the musculoskeletal system: Secondary | ICD-10-CM

## 2014-12-25 DIAGNOSIS — R29818 Other symptoms and signs involving the nervous system: Secondary | ICD-10-CM

## 2014-12-25 DIAGNOSIS — R292 Abnormal reflex: Secondary | ICD-10-CM | POA: Diagnosis not present

## 2014-12-25 DIAGNOSIS — M5412 Radiculopathy, cervical region: Secondary | ICD-10-CM | POA: Diagnosis not present

## 2014-12-25 MED ORDER — GABAPENTIN 100 MG PO CAPS: ORAL_CAPSULE | ORAL | Status: DC

## 2014-12-25 NOTE — Patient Instructions (Signed)
Stop Lyrica  Start gabapentin 100mg  1 tablet at bedtime x 3 days, then increase to 2 tab at bedtime x 3 days, and then 3 tablets at bedtime. Continue physical therapy Return to clinic in 4 months, or sooner as needed

## 2014-12-25 NOTE — Progress Notes (Signed)
Follow-up Visit   Date: 12/25/2014    Traci [name] MRN: [id number] DOB: [birth year]   Interim History: Traci [name] is a 59 y.o. left-handed Caucasian female with hypothyroidism, depression, and history of lap-band surgery which corrected hypertension and hyperlipidemia returning to the clinic for follow-up of bilateral arm discomfort.  The patient was accompanied to the clinic by self.  History of present illness: Starting in around early 2014, she developed difficulty writing, described as jerky and scruffy. She used to have nice penmanship previously. She also feels that fine motor movement is She denies muscle cramps or hand pain, but she has noticed a mild tremor when trying to write. No problems with balance, slowed movements, or stiffness.   Her greater complaint is bilateral arm soreness, involving upper arm and forearm. She keeps having to rub and massage them, because it distracts the discomfort, but does not alleviate it. Some nights, she even props her arms on pillows to be get comfortable. She endoses some weakness of the arms. For instance, she has difficulty carrying an ironing board and now drags it. She cannot carry the vacuum cleaner up the stairs, and therefore bought and extra one to keep upstairs.  She denies any problems of the legs. She does not have any leg soreness or numbness/tingling. She denies any problems with speech or swallowing.   Patient was initially evaluated by my collegue, [name] in October for hand writing problems and on that visit, she was also incidental note of spastic speech pattern and because her exam disclosed hyperreflexia, she underwent extensive work-up including EMG, MRI brain/C-spine/L-spine, and serology testing. EMG showed bilateral C8 radiculopathy and left L3-4 radiculopathy, which was also evidence on imaging. Laboratory testing including inflammatory/autoimmune diseases, nutritional deficiencies, myasthenia  gravis, and myopathy has been unremarkable (see below). She also was evaluated by Dr. Trudie Reed in Rheumatology whose work-up was negative.  Of note, in 2002, she had a an episode of acute cognitive problems with slurring of her speech and facial asymmetry and was told she had a TIA. She has stroke work-up which was negative, except for left ACA stenosis at A1.  UPDATE 12/25/2014:  She is going to PT and has been going for the past 5-weeks, which seems to helps her arm soreness (improved by 30%).  Lyrica helps her discomfort but her copay is $40 and is requesting a generic alternative.  Unfortunately, two weeks ago her partner suddenly died.  She is understandably sad and does have a good social support network.   Medications:  Current Outpatient Prescriptions on File Prior to Visit  Medication Sig Dispense Refill  . buPROPion (WELLBUTRIN SR) 150 MG 12 hr tablet Take 1 tab up to 2 times a day. 60 tablet 5  . Calcium Carb-Cholecalciferol (CALCIUM 1000 + D PO) Take 2,000 Units by mouth daily.    . fish oil-omega-3 fatty acids 1000 MG capsule Take 1 g by mouth daily.      [name] levothyroxine (SYNTHROID, LEVOTHROID) 150 MCG tablet TAKE ONE TABLET BY MOUTH DAILY EXCEPT FOR ONE-HALF TABLET ON SUNDAY AND WEDNESDAYS.TOTAL OF 6 PILLS PER DAY 7 DAYS 90 tablet 1  . Multiple Vitamin (MULTIVITAMIN) tablet Take 1 tablet by mouth daily.     . pregabalin (LYRICA) 50 MG capsule Take 1 capsule (50 mg total) by mouth 2 (two) times daily. 60 capsule 3   No current facility-administered medications on file prior to visit.    Allergies:  Allergies  Allergen Reactions  . Codeine  Itching  . Meperidine Hcl     syncope  . Simvastatin     REACTION: muscle aches    Review of Systems:  CONSTITUTIONAL: No fevers, chills, night sweats, or weight loss.  EYES: No visual changes or eye pain ENT: No hearing changes.  No history of nose bleeds.   RESPIRATORY: No cough, wheezing and shortness of breath.   CARDIOVASCULAR:  Negative for chest pain, and palpitations.   GI: Negative for abdominal discomfort, blood in stools or black stools.  No recent change in bowel habits.   GU:  No history of incontinence.   MUSCLOSKELETAL: No history of joint pain or swelling.  No myalgias.   SKIN: Negative for lesions, rash, and itching.   ENDOCRINE: Negative for cold or heat intolerance, polydipsia or goiter.   PSYCH:  + depression or anxiety symptoms.   NEURO: As Above.   Vital Signs:  BP 110/84 mmHg  Pulse 88  Ht _0  (1.651 m)  Wt 207 lb 7 oz (94.093 kg)  BMI 34.52 kg/m2  SpO2 94%  Neurological Exam: MENTAL STATUS including orientation to time, place, person, recent and remote memory, attention span and concentration, language, and fund of knowledge is normal.  Speech is not dysarthric.  CRANIAL NERVES: No visual field defects.  Pupils equal round and reactive to light.  Normal conjugate, extra-ocular eye movements in all directions of gaze.  No ptosis . Normal facial sensation. Mild left facial asymmetry. Palate elevates symmetrically.  Tongue is midline.  MOTOR:  Motor strength is 5/5 in all extremities.  No atrophy, fasciculations or abnormal movements.  No pronator drift.  Tone is normal.    COORDINATION/GAIT:   Gait narrow based and stable.   Data: Labs 06/01/2014: CK 138, TSH 0.31*, fT4 1.13, ESR 27, MG panel negative, Lyme neg, copper 128, PTH 40, B12 425  Labs 08/20/2014: CK 115, ANA neg, ESR 23, myositis panel RNP*  EMG 07/22/2014: 1. Chronic C8 radiculopathy affecting bilateral upper extremities, mild in degree electrically. 2. Chronic L3-L4 radiculopathy affecting the left lower extremity, mild in degree electrically. 3. There is no evidence of a generalized sensorimotor polyneuropathy, diffuse myopathy, or widespread disorder of anterior horn cells.  MRI cervical spine wo contrast 08/26/2014: Cervical spondylotic changes most notable C5-6 level followed by the C4-5 level. Summary of pertinent  findings include:  C5-6 broad-based disc osteophyte complex greatest right paracentral position with narrowing of the ventral aspect of the thecal sac and very mild cord flattening greater on the right. Minimal foraminal narrowing.  C4-5 small left paracentral disc osteophyte complex with narrowing of the left ventral aspect of the thecal sac. Minimal left foraminal narrowing.  C7-T1 very mild left-sided facet joint degenerative changes. Bulge slightly greater left paracentral position. Minimal indentation left ventral aspect of the thecal sac. Minimal left foraminal narrowing.  C3-4 minimal left paracentral bulge.  MRI lumbar spine 10/16/2014: 1. Slight progression of of moderate bilateral facet arthritis at L3-4 with a new synovial cyst on the right with a slight mass effect upon the thecal sac and slight deviation of the right L4 nerve within the thecal sac. 2. Stable facet arthritis at L4-5 on the left and bilaterally at L5-S1.  MRI brain wwo contrast 07/07/2014: Unremarkable appearance of the brain without evidence of acute intracranial abnormality or mass.  IMPRESSION/PLAN: Ms. Salatino is a delightful 59 year-old female returning for follow-up of changes in hand writing and bilateral arm soreness. Patient has underwent extensive evaluation including EMG of the arm and  leg, MRI of the brain, cervical spine, and lumbar spine, as well as serology testing which has essentially been unremarkable except for cervical and lumbosacral radiculopathy. Many worrisome disorders including myopathy, motor neuron disease, and neuropathy have been excluded.  I am not entirely certain why she is experiencing change in her handwriting and fine motor difficulty. Early manifestation of a neurodegenerative condition such as parkinson-plus syndrome such as corticobasal degeneration is possible, but she has no other clinical features on exam to suggest this. MRI brain does not show any evidence of  intracranial pathology. Neuropsychological testing may be indicated going forward.  For her arm soreness, trial of Lyrica and PT seems to provide benefit, but she is unable to afford Lyrica and would like to try cheaper options, so will offer gabapentin.   PLAN/RECOMMENDATIONS:  Stop Lyrica  Start gabapentin 150m and uptitrate to 3013mqhs.  If tolerating, may further increase Continue physical therapy Expressed sympathies for the recent loss of her partner Return to clinic in 4 months, or sooner as needed   The duration of this appointment visit was 25 minutes of face-to-face time with the patient.  Greater than 50% of this time was spent in counseling, explanation of diagnosis, planning of further management, and coordination of care.   Thank you for allowing me to participate in patient's care.  If I can answer any additional questions, I would be pleased to do so.    Sincerely,    Donika K. PaPosey ProntoDO

## 2015-01-06 ENCOUNTER — Ambulatory Visit: Payer: 59 | Attending: Neurology | Admitting: Physical Therapy

## 2015-01-06 ENCOUNTER — Encounter: Payer: Self-pay | Admitting: Physical Therapy

## 2015-01-06 DIAGNOSIS — M79629 Pain in unspecified upper arm: Secondary | ICD-10-CM | POA: Diagnosis not present

## 2015-01-06 DIAGNOSIS — M542 Cervicalgia: Secondary | ICD-10-CM | POA: Insufficient documentation

## 2015-01-06 DIAGNOSIS — R29898 Other symptoms and signs involving the musculoskeletal system: Secondary | ICD-10-CM | POA: Diagnosis not present

## 2015-01-06 DIAGNOSIS — M25529 Pain in unspecified elbow: Secondary | ICD-10-CM

## 2015-01-06 NOTE — Therapy (Signed)
[place], [place], [address] Phone: [phone]   Fax:  [phone]  Physical Therapy Treatment  Patient Details  Name: Traci [name] MRN: [id number] Date of Birth: 10/23/1955 Referring Provider:  Tonia Ghent, MD  Encounter Date: 01/06/2015      PT End of Session - 01/06/15 1437    Visit Number 3   Number of Visits 16   Date for PT Re-Evaluation 01/19/15   PT Start Time 2423   PT Stop Time 1535   PT Time Calculation (min) 63 min   Activity Tolerance Patient tolerated treatment well   Behavior During Therapy [facility] for tasks assessed/performed      Past Medical History  Diagnosis Date  . Hyperlipidemia   . Depression     Previously responded to Wellbutrin  . Headache(784.0)   . Migraines   . Stroke 12/2000  . Thyroid disease     Hypothyroidism  . Abnormal alkaline phosphatase test     with neg eval prev  . Hypothyroidism   . Arthritis     Past Surgical History  Procedure Laterality Date  . Laparoscopic gastric banding  2006  . Cholecystectomy      ? 20 years ago  . Total knee arthroplasty Right 11/26/2012    Procedure: TOTAL KNEE ARTHROPLASTY- right ;  Surgeon: Hessie Dibble, MD;  Location: [facility];  Service: Orthopedics;  Laterality: Right;    There were no vitals filed for this visit.  Visit Diagnosis:  Muscle pain, cervical  Pain in joint, upper arm, unspecified laterality  Weakness of both arms      Subjective Assessment - 01/06/15 1709    Subjective "Pain is really bad today, but better now after I was on a massage chair for 10 minutes during wellness fair today". One HEP hurts her (rows with red band), will modify today and add more exercises.     Currently in Pain? Yes   Pain Score 3    Pain Location Arm   Pain Orientation Left;Right;Proximal   Pain Descriptors / Indicators Aching   Pain Type Chronic pain   Multiple Pain Sites No          [facility] Adult PT  Treatment/Exercise - 01/06/15 1446    Exercises   Exercises Neck   Neck Exercises: Theraband   Scapula Retraction 10 reps;Red   Shoulder External Rotation 10 reps;Green   Horizontal ABduction 20 reps;Red   Neck Exercises: Supine   Neck Retraction 15 reps;3 secs  DNFs   Upper Extremity D2 Flexion;15 reps;Theraband  2 sets   Theraband Level (UE D2) Level 2 (Red)   Other Supine Exercise horiz abd x 15, 2 sets, red band   Other Supine Exercise ER x 10, 2 sets, red band    Manual Therapy   Manual Therapy Passive ROM   Passive ROM cervical SB and rot with contract/relax   Neck Exercises: Stretches   Upper Trapezius Stretch 2 reps;30 seconds   Levator Stretch 4 reps;30 seconds          PT Education - 01/06/15 1718    Education provided Yes   Education Details HEP: added supine shoulder hor abd, diagonal flexion, and chin tucks   Person(s) Educated Patient   Methods Verbal cues;Handout;Tactile cues;Demonstration;Explanation   Comprehension Verbalized understanding;Returned demonstration          PT Short Term Goals - 01/06/15 1719    PT SHORT TERM GOAL #1   Title Pt  will be I with HEP for C- spine, posture   Status Achieved   PT SHORT TERM GOAL #2   Title Pt will demo corrected posture and maintain with UE exercise   Status On-going   PT SHORT TERM GOAL #3   Title Pt will be able to report improved comfort at night in arms, need for less pilliows positioning.    Status On-going           PT Long Term Goals - 11/24/14 2218    PT LONG TERM GOAL #1   Title Pt will be I with advanced HEP   Time 8   Period Weeks   Status New   PT LONG TERM GOAL #2   Title PT will score  <35% on FOTO to demo improved UE function   Time 8   Period Weeks   Status New   PT LONG TERM GOAL #3   Title Pt will be able to demo UE strength to 4/5 or more for improved lifting/work   Time 8   Period Weeks   Status New   PT LONG TERM GOAL #4   Title Pt will be able to report min UE  symptoms (<2/10) with housework/assisting friend   Time 8   Period Weeks   Status New     Have not met any LTGs yet.       Plan - 01/06/15 1720    Clinical Impression Statement [name] tolerates exercises in supine positin better than in standing/seating. Updated her HEP today.    PT Next Visit Plan Arm bike. Decompression, bands for shoulders in supine. Assess HEP. Review goals.    PT Home Exercise Plan see above   Consulted and Agree with Plan of Care Patient        Problem List Patient Active Problem List   Diagnosis Date Noted  . Diarrhea 11/01/2014  . Poor fine motor skills 06/02/2014  . Cough 01/27/2014  . Rash and nonspecific skin eruption 02/06/2013  . Right knee DJD 11/26/2012    Class: Chronic  . Hoarseness of voice 10/08/2012  . Leg edema 09/20/2012  . Knee pain 08/24/2012  . Osteopenia 08/04/2012  . Work-related stress 03/18/2012  . Routine general medical examination at a health care facility 07/30/2011  . Back pain 06/08/2011  . UNSPECIFIED HYPOTHYROIDISM 04/06/2010  . HYPERLIPIDEMIA 04/06/2010  . DEPRESSION 04/06/2010  . Myalgia and myositis 04/06/2010  . HEADACHE 04/06/2010  . CEREBROVASCULAR ACCIDENT, HX OF 04/06/2010  . MIGRAINES, HX OF 04/06/2010    Traci [name] 01/06/2015, 5:23 PM  [facility] [address] [place], [place], [address] Phone: [phone]   Fax:  [phone]

## 2015-01-06 NOTE — Patient Instructions (Signed)
Head Press With Chin Tuck   Tuck chin SLIGHTLY toward chest, keep mouth closed. Feel weight on back of head. Increase weight by pressing head down. Hold __5_ seconds. Relax. Repeat _10__ times. Surface: floor   Copyright  VHI. All rights reserved.   Resisted Horizontal Abduction: Bilateral   Sit or stand, tubing in both hands, arms out in front. Keeping arms straight, pinch shoulder blades together and stretch arms out. Repeat __10__ times per set. Do ___2_ sets per session. Do ___2_ sessions per day.  http://orth.exer.us/969   Copyright  VHI. All rights reserved.    SHOULDER: Diagonal - Up and Away (Band)   Start with arm down and across body. Holding band, pull up and out. End with elbow straight and palm forward. Hold _3__ seconds. Use ___red_____ band. __10_ reps per set, _2 sets per day, __5_ days per week  Copyright  VHI. All rights reserved.

## 2015-01-14 ENCOUNTER — Encounter: Payer: 59 | Admitting: Physical Therapy

## 2015-01-21 ENCOUNTER — Ambulatory Visit: Payer: 59 | Admitting: Physical Therapy

## 2015-01-21 DIAGNOSIS — M25529 Pain in unspecified elbow: Secondary | ICD-10-CM

## 2015-01-21 DIAGNOSIS — M542 Cervicalgia: Secondary | ICD-10-CM

## 2015-01-21 DIAGNOSIS — R29898 Other symptoms and signs involving the musculoskeletal system: Secondary | ICD-10-CM

## 2015-01-21 NOTE — Patient Instructions (Signed)
   Nirvan Laban PT, DPT, LAT, ATC  [facility] Phone: [phone]     

## 2015-01-21 NOTE — Therapy (Signed)
[place], [place], [address] Phone: [phone]   Fax:  [phone]  Physical Therapy Treatment  Patient Details  Name: Traci [name] MRN: [id number] Date of Birth: 1956-06-28 Referring Provider:  Tonia Ghent, MD  Encounter Date: 01/21/2015      PT End of Session - 01/21/15 1651    Visit Number 4   Number of Visits 10   Date for PT Re-Evaluation 03/04/15   PT Start Time 82   PT Stop Time 0272   PT Time Calculation (min) 45 min   Activity Tolerance Patient tolerated treatment well   Behavior During Therapy Traci [name] for tasks assessed/performed      Past Medical History  Diagnosis Date  . Hyperlipidemia   . Depression     Previously responded to Wellbutrin  . Headache(784.0)   . Migraines   . Stroke 12/2000  . Thyroid disease     Hypothyroidism  . Abnormal alkaline phosphatase test     with neg eval prev  . Hypothyroidism   . Arthritis     Past Surgical History  Procedure Laterality Date  . Laparoscopic gastric banding  2006  . Cholecystectomy      ? 20 years ago  . Total knee arthroplasty Right 11/26/2012    Procedure: TOTAL KNEE ARTHROPLASTY- right ;  Surgeon: Hessie Dibble, MD;  Location: [facility];  Service: Orthopedics;  Laterality: Right;    There were no vitals filed for this visit.  Visit Diagnosis:  Muscle pain, cervical - Plan: PT plan of care cert/re-cert  Pain in joint, upper arm, unspecified laterality - Plan: PT plan of care cert/re-cert  Weakness of both arms - Plan: PT plan of care cert/re-cert      Subjective Assessment - 01/21/15 1636    Subjective she reports that the neck has been doing ok, but occasionally has some bad days. "i need to go see a back dr because my low back because it feels so tight"   Currently in Pain? Yes   Pain Score 0-No pain   Pain Location Neck   Pain Orientation Left;Right   Pain Descriptors / Indicators Aching   Pain Type  Chronic pain   Pain Onset More than a month ago   Pain Frequency Constant   Aggravating Factors  just occurs when it wants to   Pain Relieving Factors walking around.    Pain Score 4   Pain Location Back  9/10 this morning, 4/10 now   Pain Orientation Right;Left            [facility] PT Assessment - 01/21/15 0001    Observation/Other Assessments   Focus on Therapeutic Outcomes (FOTO)  37% limitation   AROM   Cervical Flexion 54   Cervical Extension 58   Cervical - Right Side Bend 32   Cervical - Left Side Bend 24   Cervical - Right Rotation 45  with a mild crunch   Cervical - Left Rotation 45  with mild crunch   Strength   Right Shoulder Flexion 4+/5   Right Shoulder ABduction 4/5   Left Shoulder Flexion 4+/5   Left Shoulder ABduction 4/5                     [facility] Adult PT Treatment/Exercise - 01/21/15 0001    Neck Exercises: Seated   Other Seated Exercise DNF 5 x 5 sec hold   Shoulder Exercises: Standing   External Rotation AROM;Strengthening;Both;10  reps;Theraband   Theraband Level (Shoulder External Rotation) Level 3 (Green)   Internal Rotation AROM;Strengthening;Both;10 reps;Theraband   Theraband Level (Shoulder Internal Rotation) Level 3 (Green)   Row Both;10 reps;Theraband   Theraband Level (Shoulder Row) Level 3 (Green)   Other Standing Exercises bicep curls 2 x 10  with green theraband   Manual Therapy   Manual Therapy Soft tissue mobilization   Manual therapy comments trigger point release of L upper trap   Neck Exercises: Stretches   Upper Trapezius Stretch 2 reps;30 seconds   Levator Stretch 30 seconds;2 reps                PT Education - 01/21/15 1650    Education Details updated POC, and added HEP   Person(s) Educated Patient   Methods Explanation   Comprehension Verbalized understanding          PT Short Term Goals - 01/21/15 1740    PT SHORT TERM GOAL #1   Title Pt will be I with HEP for C- spine, posture   Time 4    Period Weeks   Status Achieved   PT SHORT TERM GOAL #2   Title Pt will demo corrected posture and maintain with UE exercise   Time 4   Period Weeks   Status Achieved   PT SHORT TERM GOAL #3   Title Pt will be able to report improved comfort at night in arms, need for less pilliows positioning.    Baseline still uses pillows   Time 4   Period Weeks   Status On-going           PT Long Term Goals - 01/21/15 1740    PT LONG TERM GOAL #1   Title Pt will be I with advanced HEP   Time 8   Period Weeks   Status On-going   PT LONG TERM GOAL #2   Title PT will score  <35% on FOTO to demo improved UE function   Time 8   Period Weeks   Status Partially Met   PT LONG TERM GOAL #3   Title Pt will be able to demo UE strength to 4/5 or more for improved lifting/work   Time 8   Period Weeks   Status Achieved   PT LONG TERM GOAL #4   Title Pt will be able to report min UE symptoms (<2/10) with housework/assisting friend   Time 8   Period Weeks   Status On-going               Plan - 01/21/15 1737    Clinical Impression Statement Traci [name] presents to therapy with report that her neck has been doing better since last being seen a couple of weeks ago. she met all STG, and LTG 4 and partially met LTG 3. She continues to dmeonstrate mild limitation with cervical mobilty with tightness of shoulder musculature of bil upper trapezius and levator musuclature. she has improved with bil shoulder Strength as well. She reports the pain comes and goes and only gets aggrivated intermittently. Plan to see her 1 x a week for the next 6 weeks to continue with decreasing muscle tightnes improving her cervical mobility by continuing with her POC.    Pt will benefit from skilled therapeutic intervention in order to improve on the following deficits Decreased endurance;Decreased mobility;Impaired sensation;Pain;Improper body mechanics;Decreased range of motion;Decreased activity tolerance;Decreased  strength;Increased fascial restricitons;Impaired flexibility;Impaired UE functional use;Postural dysfunction   Rehab Potential Good   PT Frequency 2x /  week   PT Duration 6 weeks   PT Treatment/Interventions Electrical Stimulation;Therapeutic exercise;Patient/family education;Moist Heat;Traction;Functional mobility training;Neuromuscular re-education;Manual techniques;Passive range of motion;Dry needling;Therapeutic activities;Ultrasound;Cryotherapy   PT Next Visit Plan shoulder strengthening, modalities PRN, manual, cervical mobility    PT Home Exercise Plan see HEP handout   Consulted and Agree with Plan of Care Patient        Problem List Patient Active Problem List   Diagnosis Date Noted  . Diarrhea 11/01/2014  . Poor fine motor skills 06/02/2014  . Cough 01/27/2014  . Rash and nonspecific skin eruption 02/06/2013  . Right knee DJD 11/26/2012    Class: Chronic  . Hoarseness of voice 10/08/2012  . Leg edema 09/20/2012  . Knee pain 08/24/2012  . Osteopenia 08/04/2012  . Work-related stress 03/18/2012  . Routine general medical examination at a health care facility 07/30/2011  . Back pain 06/08/2011  . UNSPECIFIED HYPOTHYROIDISM 04/06/2010  . HYPERLIPIDEMIA 04/06/2010  . DEPRESSION 04/06/2010  . Myalgia and myositis 04/06/2010  . HEADACHE 04/06/2010  . CEREBROVASCULAR ACCIDENT, HX OF 04/06/2010  . MIGRAINES, HX OF 04/06/2010   Starr Lake PT, DPT, LAT, ATC  01/21/2015  5:48 PM     [name] [address] [place], [place], [address] Phone: [phone]   Fax:  [phone]

## 2015-01-28 ENCOUNTER — Ambulatory Visit: Payer: 59 | Admitting: Physical Therapy

## 2015-01-28 DIAGNOSIS — M25529 Pain in unspecified elbow: Secondary | ICD-10-CM

## 2015-01-28 DIAGNOSIS — M542 Cervicalgia: Secondary | ICD-10-CM | POA: Diagnosis not present

## 2015-01-28 DIAGNOSIS — R29898 Other symptoms and signs involving the musculoskeletal system: Secondary | ICD-10-CM

## 2015-01-28 NOTE — Therapy (Signed)
[facility] [address] [place], [place], [address] Phone: [phone]   Fax:  [phone]  Physical Therapy Treatment  Patient Details  Name: Traci [name] MRN: [id number] Date of Birth: 11/15/1955 Referring Provider:  Glendale Chard, DO  Encounter Date: 01/28/2015      PT End of Session - 01/28/15 1629    Visit Number 5   Number of Visits 10   Date for PT Re-Evaluation 03/04/15   PT Start Time 1545   PT Stop Time 1626   PT Time Calculation (min) 41 min   Activity Tolerance Patient tolerated treatment well      Past Medical History  Diagnosis Date  . Hyperlipidemia   . Depression     Previously responded to Wellbutrin  . Headache(784.0)   . Migraines   . Stroke 12/2000  . Thyroid disease     Hypothyroidism  . Abnormal alkaline phosphatase test     with neg eval prev  . Hypothyroidism   . Arthritis     Past Surgical History  Procedure Laterality Date  . Laparoscopic gastric banding  2006  . Cholecystectomy      ? 20 years ago  . Total knee arthroplasty Right 11/26/2012    Procedure: TOTAL KNEE ARTHROPLASTY- right ;  Surgeon: Velna Ochs, MD;  Location: [facility];  Service: Orthopedics;  Laterality: Right;    There were no vitals filed for this visit.  Visit Diagnosis:  Muscle pain, cervical  Pain in joint, upper arm, unspecified laterality  Weakness of both arms      Subjective Assessment - 01/28/15 1547    Subjective Stressed this week, had a reaction to Tramadol (for knee pain ).  Neck feels tight   Currently in Pain? No/denies  tight   Pain Score 8   Pain Location Back   Pain Orientation Lower   Pain Type Chronic pain                         [facility] Adult PT Treatment/Exercise - 01/28/15 1553    Neck Exercises: Machines for Strengthening   Other Machines for Strengthening NuStep level 5 , UE and LE 8 min    Neck Exercises: Theraband   Scapula Retraction 10 reps   Shoulder External Rotation 10 reps;Red   Horizontal ABduction 20 reps;Red   Neck Exercises: Supine   Neck Retraction 15 reps;5 secs   Other Supine Exercise cervical ROM: rotation, nods   Manual Therapy   Manual Therapy --   Manual therapy comments trigger point release of L upper trap   Soft tissue mobilization post cervical and bilat. upper traps    Passive ROM rotation and sidebending                  PT Short Term Goals - 01/28/15 1605    PT SHORT TERM GOAL #1   Title Pt will be I with HEP for C- spine, posture   Status Achieved   PT SHORT TERM GOAL #2   Title Pt will demo corrected posture and maintain with UE exercise   Status Achieved   PT SHORT TERM GOAL #3   Title Pt will be able to report improved comfort at night in arms, need for less pilliows positioning.    Status On-going           PT Long Term Goals - 01/28/15 1605    PT LONG TERM GOAL #1  Title Pt will be I with advanced HEP   Status On-going   PT LONG TERM GOAL #2   Title PT will score  <35% on FOTO to demo improved UE function   Status On-going   PT LONG TERM GOAL #3   Title Pt will be able to demo UE strength to 4/5 or more for improved lifting/work   Status Achieved   PT LONG TERM GOAL #4   Title Pt will be able to report min UE symptoms (<2/10) with housework/assisting friend   Status On-going               Plan - 01/28/15 1629    Clinical Impression Statement Patient reports relief of neck tightness following therapy today. She is most limited by low back pain, may consider referral for low back when she sees MD next week.  Will continue to reinforce posture and progress strength in UE, LE if MD allows. for LBP Rx   PT Next Visit Plan shoulder strengthening, modalities PRN, manual, cervical mobility    PT Home Exercise Plan see HEP handout, none new given today   Consulted and Agree with Plan of Care Patient        Problem List Patient Active Problem List   Diagnosis  Date Noted  . Diarrhea 11/01/2014  . Poor fine motor skills 06/02/2014  . Cough 01/27/2014  . Rash and nonspecific skin eruption 02/06/2013  . Right knee DJD 11/26/2012    Class: Chronic  . Hoarseness of voice 10/08/2012  . Leg edema 09/20/2012  . Knee pain 08/24/2012  . Osteopenia 08/04/2012  . Work-related stress 03/18/2012  . Routine general medical examination at a health care facility 07/30/2011  . Back pain 06/08/2011  . UNSPECIFIED HYPOTHYROIDISM 04/06/2010  . HYPERLIPIDEMIA 04/06/2010  . DEPRESSION 04/06/2010  . Myalgia and myositis 04/06/2010  . HEADACHE 04/06/2010  . CEREBROVASCULAR ACCIDENT, HX OF 04/06/2010  . MIGRAINES, HX OF 04/06/2010    Dawnmarie Breon 01/28/2015, 4:33 PM  [facility] [address] [place], [place], [address] Phone: [phone]   Fax:  [phone]     Karie MainlandJennifer Azaliyah Kennard, PT 01/28/2015 4:34 PM Phone: [phone] Fax: [phone]

## 2015-02-17 ENCOUNTER — Ambulatory Visit: Payer: 59 | Attending: Neurology | Admitting: Physical Therapy

## 2015-02-24 ENCOUNTER — Telehealth: Payer: Self-pay | Admitting: Family Medicine

## 2015-02-24 ENCOUNTER — Ambulatory Visit: Payer: 59 | Admitting: Physical Therapy

## 2015-02-24 NOTE — Telephone Encounter (Signed)
Pt had mri done at [facility].  She was told that there was a cyst on her kidney and wanted to know if she should be worried about it.  Pt requests cb day number is [phone] and after 530 is [phone]. thanks

## 2015-02-25 NOTE — Telephone Encounter (Signed)
Likely not a problem since her renal function was fine the last time we checked it.  Cysts are often found incidentally on imaging, esp MRI.  Please ask them to send me a copy of the MRI/report and I'll take a look at it.   Thanks.

## 2015-02-25 NOTE — Telephone Encounter (Signed)
Addendum- records reviewed.  Kidneys unremarkable on CT abd 10/2014.   The lesion on the MR was a likely 47mm cyst.  This wouldn't need f/u or extra imaging, since the recent CT was okay.  Thanks.

## 2015-02-25 NOTE — Telephone Encounter (Signed)
Left message on patient's voicemail to return call

## 2015-02-25 NOTE — Telephone Encounter (Signed)
Patient advised.

## 2015-03-03 ENCOUNTER — Encounter: Payer: 59 | Admitting: Physical Therapy

## 2015-03-10 ENCOUNTER — Ambulatory Visit: Payer: 59 | Attending: Neurology | Admitting: Physical Therapy

## 2015-03-10 DIAGNOSIS — M542 Cervicalgia: Secondary | ICD-10-CM | POA: Diagnosis not present

## 2015-03-10 DIAGNOSIS — R29898 Other symptoms and signs involving the musculoskeletal system: Secondary | ICD-10-CM

## 2015-03-10 DIAGNOSIS — M25529 Pain in unspecified elbow: Secondary | ICD-10-CM

## 2015-03-10 DIAGNOSIS — M79629 Pain in unspecified upper arm: Secondary | ICD-10-CM | POA: Diagnosis present

## 2015-03-10 NOTE — Therapy (Signed)
[place], [place], [address] Phone: [phone]   Fax:  [phone]  Physical Therapy Treatment  Patient Details  Name: Traci [name] MRN: [id number] Date of Birth: 09/04/1956 Referring Provider:  Tonia Ghent, MD  Encounter Date: 03/10/2015      PT End of Session - 03/10/15 1656    Visit Number 6   Number of Visits 10   Date for PT Re-Evaluation 03/04/15   PT Start Time 1620   PT Stop Time 1656   PT Time Calculation (min) 36 min   Activity Tolerance Patient tolerated treatment well   Behavior During Therapy [facility] for tasks assessed/performed      Past Medical History  Diagnosis Date  . Hyperlipidemia   . Depression     Previously responded to Wellbutrin  . Headache(784.0)   . Migraines   . Stroke 12/2000  . Thyroid disease     Hypothyroidism  . Abnormal alkaline phosphatase test     with neg eval prev  . Hypothyroidism   . Arthritis     Past Surgical History  Procedure Laterality Date  . Laparoscopic gastric banding  2006  . Cholecystectomy      ? 20 years ago  . Total knee arthroplasty Right 11/26/2012    Procedure: TOTAL KNEE ARTHROPLASTY- right ;  Surgeon: Hessie Dibble, MD;  Location: [facility];  Service: Orthopedics;  Laterality: Right;    There were no vitals filed for this visit.  Visit Diagnosis:  Muscle pain, cervical  Pain in joint, upper arm, unspecified laterality  Weakness of both arms      Subjective Assessment - 03/10/15 1633    Subjective "I have been so busy lately, I have had to cancel some visits. Overall neck and the shoulders are doing pretty good.    Currently in Pain? No/denies   Pain Location Neck   Pain Orientation Right;Left   Pain Onset More than a month ago   Pain Score 1   Pain Location Shoulder   Pain Orientation Right;Left            [facility] PT Assessment - 03/10/15 0001    Observation/Other Assessments   Focus on Therapeutic  Outcomes (FOTO)  29% limitation   AROM   Cervical Flexion 58   Cervical Extension 56   Cervical - Right Side Bend 38   Cervical - Left Side Bend 28   Cervical - Right Rotation 50   Cervical - Left Rotation 50   Strength   Right Shoulder Flexion 4+/5   Right Shoulder Extension 5/5   Right Shoulder ABduction 4+/5   Right Shoulder Internal Rotation 4+/5   Right Shoulder External Rotation 4+/5   Left Shoulder Flexion 4+/5   Left Shoulder Extension 5/5   Left Shoulder ABduction 4/5   Left Shoulder Internal Rotation 4+/5   Left Shoulder External Rotation 4+/5                               PT Short Term Goals - 03/10/15 1649    PT SHORT TERM GOAL #1   Title Pt will be I with HEP for C- spine, posture   Time 4   Period Weeks   Status Achieved   PT SHORT TERM GOAL #2   Title Pt will demo corrected posture and maintain with UE exercise   Time 4   Period Weeks   Status Achieved  PT SHORT TERM GOAL #3   Title Pt will be able to report improved comfort at night in arms, need for less pilliows positioning.    Baseline still uses pillows   Time 4   Period Weeks   Status Achieved           PT Long Term Goals - 2015-03-25 1648    PT LONG TERM GOAL #1   Title Pt will be I with advanced HEP   Time 8   Period Weeks   Status Achieved   PT LONG TERM GOAL #2   Title PT will score  <35% on FOTO to demo improved UE function   Time 8   Period Weeks   Status Achieved   PT LONG TERM GOAL #3   Title Pt will be able to demo UE strength to 4/5 or more for improved lifting/work   Time 8   Period Weeks   Status Achieved   PT LONG TERM GOAL #4   Title Pt will be able to report min UE symptoms (<2/10) with housework/assisting friend   Time 8   Period Weeks   Status Achieved               Plan - 03/25/15 1656    Clinical Impression Statement Traci [name] presents to therapy today with report that she is doing better and has been keeping up with her HEP. She has  met all STG and LTG. She has increased her neck AROM, and shoulder strength and is additionally pain free. She no longer requires physical therapy and will be discharged today.    PT Next Visit Plan discharged   Consulted and Agree with Plan of Care Patient          G-Codes - 03/25/15 1649    Functional Assessment Tool Used FOTO 29% limitation   Functional Limitation Carrying, moving and handling objects   Carrying, Moving and Handling Objects Goal Status ([id number]) At least 20 percent but less than 40 percent impaired, limited or restricted   Carrying, Moving and Handling Objects Discharge Status [phone]) At least 20 percent but less than 40 percent impaired, limited or restricted      Problem List Patient Active Problem List   Diagnosis Date Noted  . Diarrhea 11/01/2014  . Poor fine motor skills 06/02/2014  . Cough 01/27/2014  . Rash and nonspecific skin eruption 02/06/2013  . Right knee DJD 11/26/2012    Class: Chronic  . Hoarseness of voice 10/08/2012  . Leg edema 09/20/2012  . Knee pain 08/24/2012  . Osteopenia 08/04/2012  . Work-related stress 03/18/2012  . Routine general medical examination at a health care facility 07/30/2011  . Back pain 06/08/2011  . UNSPECIFIED HYPOTHYROIDISM 04/06/2010  . HYPERLIPIDEMIA 04/06/2010  . DEPRESSION 04/06/2010  . Myalgia and myositis 04/06/2010  . HEADACHE 04/06/2010  . CEREBROVASCULAR ACCIDENT, HX OF 04/06/2010  . MIGRAINES, HX OF 04/06/2010                 PHYSICAL THERAPY DISCHARGE SUMMARY  Visits from Start of Care: 6  Current functional level related to goals / functional outcomes: FOTO 29% limitation   Remaining deficits: N/a   Education / Equipment: HEP handout, and theraband  Plan: Patient agrees to discharge.  Patient goals were met. Patient is being discharged due to meeting the stated rehab goals.  ?????       Starr Lake PT, DPT, LAT, ATC  March 25, 2015  5:00 PM    [facility]  Health Outpatient  Rehabilitation [facility] [address] [place], [place], [address] Phone: [phone]   Fax:  [phone]

## 2015-03-31 ENCOUNTER — Telehealth: Payer: Self-pay | Admitting: Neurology

## 2015-03-31 NOTE — Telephone Encounter (Signed)
Pt called and said she is having neck spasms and needed to know if Dr Allena Katz needs to see her or if she needs to go to her PCP/[facility] CB# [phone] after 1:30

## 2015-03-31 NOTE — Telephone Encounter (Signed)
Instructed patient to see her PCP.  She will probably get in sooner with them.  She has an appointment with [place] in September.

## 2015-04-07 ENCOUNTER — Encounter: Payer: Self-pay | Admitting: Internal Medicine

## 2015-05-21 ENCOUNTER — Ambulatory Visit: Payer: 59 | Admitting: Neurology

## 2015-05-28 ENCOUNTER — Encounter: Payer: Self-pay | Admitting: Internal Medicine

## 2015-05-28 ENCOUNTER — Telehealth: Payer: Self-pay | Admitting: Family Medicine

## 2015-05-28 ENCOUNTER — Ambulatory Visit (INDEPENDENT_AMBULATORY_CARE_PROVIDER_SITE_OTHER): Payer: 59 | Admitting: Internal Medicine

## 2015-05-28 VITALS — BP 158/98 | HR 86 | Temp 97.8°F | Wt 213.2 lb

## 2015-05-28 DIAGNOSIS — IMO0001 Reserved for inherently not codable concepts without codable children: Secondary | ICD-10-CM

## 2015-05-28 DIAGNOSIS — K219 Gastro-esophageal reflux disease without esophagitis: Secondary | ICD-10-CM | POA: Diagnosis not present

## 2015-05-28 DIAGNOSIS — R030 Elevated blood-pressure reading, without diagnosis of hypertension: Secondary | ICD-10-CM

## 2015-05-28 NOTE — Patient Instructions (Signed)
Gastroesophageal Reflux Disease, Adult Gastroesophageal reflux disease (GERD) happens when acid from your stomach flows up into the esophagus. When acid comes in contact with the esophagus, the acid causes soreness (inflammation) in the esophagus. Over time, GERD may create small holes (ulcers) in the lining of the esophagus. CAUSES   Increased body weight. This puts pressure on the stomach, making acid rise from the stomach into the esophagus.  Smoking. This increases acid production in the stomach.  Drinking alcohol. This causes decreased pressure in the lower esophageal sphincter (valve or ring of muscle between the esophagus and stomach), allowing acid from the stomach into the esophagus.  Late evening meals and a full stomach. This increases pressure and acid production in the stomach.  A malformed lower esophageal sphincter. Sometimes, no cause is found. SYMPTOMS   Burning pain in the lower part of the mid-chest behind the breastbone and in the mid-stomach area. This may occur twice a week or more often.  Trouble swallowing.  Sore throat.  Dry cough.  Asthma-like symptoms including chest tightness, shortness of breath, or wheezing. DIAGNOSIS  Your caregiver may be able to diagnose GERD based on your symptoms. In some cases, X-rays and other tests may be done to check for complications or to check the condition of your stomach and esophagus. TREATMENT  Your caregiver may recommend over-the-counter or prescription medicines to help decrease acid production. Ask your caregiver before starting or adding any new medicines.  HOME CARE INSTRUCTIONS   Change the factors that you can control. Ask your caregiver for guidance concerning weight loss, quitting smoking, and alcohol consumption.  Avoid foods and drinks that make your symptoms worse, such as:  Caffeine or alcoholic drinks.  Chocolate.  Peppermint or mint flavorings.  Garlic and onions.  Spicy foods.  Citrus fruits,  such as oranges, lemons, or limes.  Tomato-based foods such as sauce, chili, salsa, and pizza.  Fried and fatty foods.  Avoid lying down for the 3 hours prior to your bedtime or prior to taking a nap.  Eat small, frequent meals instead of large meals.  Wear loose-fitting clothing. Do not wear anything tight around your waist that causes pressure on your stomach.  Raise the head of your bed 6 to 8 inches with wood blocks to help you sleep. Extra pillows will not help.  Only take over-the-counter or prescription medicines for pain, discomfort, or fever as directed by your caregiver.  Do not take aspirin, ibuprofen, or other nonsteroidal anti-inflammatory drugs (NSAIDs). SEEK IMMEDIATE MEDICAL CARE IF:   You have pain in your arms, neck, jaw, teeth, or back.  Your pain increases or changes in intensity or duration.  You develop nausea, vomiting, or sweating (diaphoresis).  You develop shortness of breath, or you faint.  Your vomit is green, yellow, black, or looks like coffee grounds or blood.  Your stool is red, bloody, or black. These symptoms could be signs of other problems, such as heart disease, gastric bleeding, or esophageal bleeding. MAKE SURE YOU:   Understand these instructions.  Will watch your condition.  Will get help right away if you are not doing well or get worse. Document Released: 05/31/2005 Document Revised: 11/13/2011 Document Reviewed: 03/10/2011 Exit[employer] Patient Information 2015 [employer]. This information is not intended to replace advice given to you by your health care provider. Make sure you discuss any questions you have with your health care provider.  

## 2015-05-28 NOTE — Telephone Encounter (Signed)
Pt has appt 05/28/15 at 2:15 with Nicki Reaper NP.

## 2015-05-28 NOTE — Progress Notes (Signed)
Pre visit review using our clinic review tool, if applicable. No additional management support is needed unless otherwise documented below in the visit note. 

## 2015-05-28 NOTE — Progress Notes (Signed)
Subjective:    Patient ID: Traci [name], female    DOB: 06-12-1956, 59 y.o.   MRN: [id number]  HPI  Pt presents to the clinic today with c/o gas, bloating and belching. This started about 3-4 days ago. She thought it may have been triggered by eating fried fish, which she never eats. Her symptoms seem worse when she is laying down. She does have a burning sensation in her throat. She denies chest pain or shortness of breath. She has been taking Tums with minimal relief. She reports she has never had heartburn before.  Of note, her BP is elevated today at 158/98. She also had it checked by the nurse at her employer and it was elevated at that time as well. She did have a stroke in 2002. She was on BP medication at that time, but was taken off it after she had lap band surgery > 10 years. She denies headache, dizziness, blurred vision or shortness of breath.  Review of Systems      Past Medical History  Diagnosis Date  . Hyperlipidemia   . Depression     Previously responded to Wellbutrin  . Headache(784.0)   . Migraines   . Stroke 12/2000  . Thyroid disease     Hypothyroidism  . Abnormal alkaline phosphatase test     with neg eval prev  . Hypothyroidism   . Arthritis     Current Outpatient Prescriptions  Medication Sig Dispense Refill  . levothyroxine (SYNTHROID, LEVOTHROID) 150 MCG tablet TAKE ONE TABLET BY MOUTH DAILY EXCEPT FOR ONE-HALF TABLET ON SUNDAY AND WEDNESDAYS.TOTAL OF 6 PILLS PER DAY 7 DAYS 90 tablet 1  . Multiple Vitamin (MULTIVITAMIN) tablet Take 1 tablet by mouth daily.      No current facility-administered medications for this visit.    Allergies  Allergen Reactions  . Codeine Itching  . Meperidine Hcl     syncope  . Simvastatin     REACTION: muscle aches  . Tramadol Itching    Family History  Problem Relation Age of Onset  . Cancer Other     Ovarian, Uterine  . Heart disease Other   . Stroke Mother   . Diabetes Mother   . Heart attack  Father     Deceased, 71  . Melanoma Sister   . Healthy Brother     Social History   Social History  . Marital Status: Single    Spouse Name: N/A  . Number of Children: 0  . Years of Education: N/A   Occupational History  . Paralegal Unemployed   Social History Main Topics  . Smoking status: Never Smoker   . Smokeless tobacco: Never Used  . Alcohol Use: No     Comment: "maybe one on new years"  . Drug Use: No  . Sexual Activity: Not on file   Other Topics Concern  . Not on file   Social History Narrative   Regular exercise:  Yes    Works for city of [employer] level of education:  College, paralegal     Constitutional: Denies fever, malaise, fatigue, headache or abrupt weight changes.  HEENT: Denies eye pain, eye redness, ear pain, ringing in the ears, wax buildup, runny [name], nasal congestion, bloody [name], or sore throat. Respiratory: Denies difficulty breathing, shortness of breath, cough or sputum production.   Cardiovascular: Denies chest pain, chest tightness, palpitations or swelling in the hands or feet.  Gastrointestinal: Pt reports heartburn. Denies abdominal pain, constipation,  diarrhea or blood in the stool.  Neurological: Denies dizziness, difficulty with memory, difficulty with speech or problems with balance and coordination.    No other specific complaints in a complete review of systems (except as listed in HPI above).  Objective:   Physical Exam  BP 158/98 mmHg  Pulse 86  Temp(Src) 97.8 F (36.6 C) (Oral)  Wt 213 lb 4 oz (96.73 kg)  SpO2 98% Wt Readings from Last 3 Encounters:  05/28/15 213 lb 4 oz (96.73 kg)  12/25/14 207 lb 7 oz (94.093 kg)  10/30/14 205 lb (92.987 kg)    General: Appears her stated age,obese  in NAD. HEENT: Head: normal shape and size; Eyes: sclera white, no icterus, conjunctiva pink, PERRLA and EOMs intact; She does have right side facial drop but this is unchanged from her previous stroke.  Cardiovascular:  Normal rate and rhythm. S1,S2 noted.  No murmur, rubs or gallops noted.  Pulmonary/Chest: Normal effort and positive vesicular breath sounds. No respiratory distress. No wheezes, rales or ronchi noted.  Abdomen: Soft and nontender. Normal bowel sounds. Lap band palpable just to the right of the epigastric area. Neurological: Alert and oriented.   BMET    Component Value Date/Time   NA 134* 10/27/2014 1552   K 3.4* 10/27/2014 1552   CL 105 10/27/2014 1552   CO2 22 10/27/2014 1552   GLUCOSE 107* 10/27/2014 1552   BUN 17 10/27/2014 1552   CREATININE 0.88 10/27/2014 1552   CALCIUM 8.8 10/27/2014 1552   GFRNONAA 70* 10/27/2014 1552   GFRAA 81* 10/27/2014 1552    Lipid Panel     Component Value Date/Time   CHOL 168 07/17/2011 0920   TRIG 260.0* 07/17/2011 0920   HDL 31.30* 07/17/2011 0920   CHOLHDL 5 07/17/2011 0920   VLDL 52.0* 07/17/2011 0920    CBC    Component Value Date/Time   WBC 7.3 10/27/2014 1552   WBC 15.7* 09/05/2014 0935   RBC 4.70 10/27/2014 1552   RBC 4.68 09/05/2014 0935   HGB 13.6 10/27/2014 1552   HGB 14.1 09/05/2014 0935   HCT 41.9 10/27/2014 1552   HCT 42.1 09/05/2014 0935   PLT 230 10/27/2014 1552   MCV 89.1 10/27/2014 1552   MCV 89.9 09/05/2014 0935   MCH 28.9 10/27/2014 1552   MCH 30.1 09/05/2014 0935   MCHC 32.5 10/27/2014 1552   MCHC 33.5 09/05/2014 0935   RDW 14.4 10/27/2014 1552   LYMPHSABS 1.1 10/27/2014 1552   MONOABS 1.0 10/27/2014 1552   EOSABS 0.1 10/27/2014 1552   BASOSABS 0.0 10/27/2014 1552    Hgb A1C No results found for: HGBA1C       Assessment & Plan:   GERD:  Discussed avoiding trigger foods She will start Prilosec 20 mg OTC OK to continue Tums prn  Elevated Blood Pressure:  She is asymptomatic Will recheck BP in 2 weeks, if elevated, will start BP medication  RTC in 2 weeks to follow up HTN/GERD, sooner if needed

## 2015-05-28 NOTE — Telephone Encounter (Signed)
[facility] Day - Client TELEPHONE ADVICE RECORD [facility] Patient Name: Traci [name] DOB: Jun 03, 1956 Initial Comment Caller states c/o blood pressure 153/98, vomiting, unable to eat,gassy Nurse Assessment Nurse: Roderic Ovens, RN, Amy Date/Time Lamount Cohen Time): 05/28/2015 9:40:10 AM Confirm and document reason for call. If symptomatic, describe symptoms. ---VOMITING - UNABLE TO KEEP ANYTHING DOWN. SHE IS HAVING PROBLEMS WITH EVERYTHING SHE IS EATING. SHE IS EATING ANTACIDS. SHE FEELS OKAY AFTER BURPING FOR ABOUT AN HOUR. SHE HAS NOT HAD THIS ISSUE FOR A LONG TIME. BLOOD PRESSURE WAS 153/98. NO SYMPTOMS WITH THAT. Has the patient traveled out of the country within the last 30 days? ---Not Applicable Does the patient require triage? ---Yes Related visit to physician within the last 2 weeks? ---No Does the PT have any chronic conditions? (i.e. diabetes, asthma, etc.) ---No Guidelines Guideline Title Affirmed Question Affirmed Notes Vomiting [1] MILD to MODERATE vomiting (e.g., 1-5 times/day) AND [2] abdomen looks much more swollen than usual Final Disposition User See Physician within 4 Hours (or PCP triage) Roderic Ovens, RN, Amy Referrals REFERRED TO PCP OFFICE Disagree/Comply: Comply

## 2015-06-11 ENCOUNTER — Encounter: Payer: Self-pay | Admitting: Family Medicine

## 2015-06-11 ENCOUNTER — Ambulatory Visit (INDEPENDENT_AMBULATORY_CARE_PROVIDER_SITE_OTHER): Payer: 59 | Admitting: Family Medicine

## 2015-06-11 VITALS — BP 125/82 | HR 98 | Temp 98.0°F | Wt 212.5 lb

## 2015-06-11 DIAGNOSIS — Z119 Encounter for screening for infectious and parasitic diseases, unspecified: Secondary | ICD-10-CM

## 2015-06-11 DIAGNOSIS — F329 Major depressive disorder, single episode, unspecified: Secondary | ICD-10-CM

## 2015-06-11 DIAGNOSIS — F32A Depression, unspecified: Secondary | ICD-10-CM

## 2015-06-11 DIAGNOSIS — R112 Nausea with vomiting, unspecified: Secondary | ICD-10-CM | POA: Diagnosis not present

## 2015-06-11 LAB — CBC WITH DIFFERENTIAL/PLATELET
Basophils Absolute: 0 10*3/uL (ref 0.0–0.1)
Basophils Relative: 0 % (ref 0–1)
Eosinophils Absolute: 0.4 10*3/uL (ref 0.0–0.7)
Eosinophils Relative: 5 % (ref 0–5)
HCT: 34.7 % — ABNORMAL LOW (ref 36.0–46.0)
Hemoglobin: 12.1 g/dL (ref 12.0–15.0)
Lymphocytes Relative: 21 % (ref 12–46)
Lymphs Abs: 1.7 10*3/uL (ref 0.7–4.0)
MCH: 30.1 pg (ref 26.0–34.0)
MCHC: 34.9 g/dL (ref 30.0–36.0)
MCV: 86.3 fL (ref 78.0–100.0)
MPV: 8.7 fL (ref 8.6–12.4)
Monocytes Absolute: 0.6 10*3/uL (ref 0.1–1.0)
Monocytes Relative: 8 % (ref 3–12)
Neutro Abs: 5.3 10*3/uL (ref 1.7–7.7)
Neutrophils Relative %: 66 % (ref 43–77)
Platelets: 241 10*3/uL (ref 150–400)
RBC: 4.02 MIL/uL (ref 3.87–5.11)
RDW: 13.4 % (ref 11.5–15.5)
WBC: 8 10*3/uL (ref 4.0–10.5)

## 2015-06-11 LAB — COMPREHENSIVE METABOLIC PANEL
ALT: 16 U/L (ref 6–29)
AST: 17 U/L (ref 10–35)
Albumin: 4 g/dL (ref 3.6–5.1)
Alkaline Phosphatase: 150 U/L — ABNORMAL HIGH (ref 33–130)
BUN: 15 mg/dL (ref 7–25)
CO2: 26 mmol/L (ref 20–31)
Calcium: 8.8 mg/dL (ref 8.6–10.4)
Chloride: 105 mmol/L (ref 98–110)
Creat: 0.79 mg/dL (ref 0.50–0.99)
Glucose, Bld: 113 mg/dL — ABNORMAL HIGH (ref 65–99)
Potassium: 4.2 mmol/L (ref 3.5–5.3)
Sodium: 141 mmol/L (ref 135–146)
Total Bilirubin: 0.4 mg/dL (ref 0.2–1.2)
Total Protein: 6.2 g/dL (ref 6.1–8.1)

## 2015-06-11 MED ORDER — BUPROPION HCL ER (SR) 150 MG PO TB12: 150.0000 mg | ORAL_TABLET | Freq: Two times a day (BID) | ORAL | Status: DC

## 2015-06-11 MED ORDER — LEVOTHYROXINE SODIUM 150 MCG PO TABS: ORAL_TABLET | ORAL | Status: DC

## 2015-06-11 NOTE — Patient Instructions (Signed)
Restart wellbutrin and continue prilosec for now.  Go to the lab on the way out.  We'll contact you with your lab report. Update me in about 1 week.  Take care.  Glad to see you.

## 2015-06-11 NOTE — Progress Notes (Signed)
Pre visit review using our clinic review tool, if applicable. No additional management support is needed unless otherwise documented below in the visit note.  GERD.  Vomiting and regurgitation.  She can vomiting the initial bolus of food, then finish the meal w/o difficulty.  Her bloating and burping are better but not resolved- on prilosec now.  No other recent med changes.  Sx happen with most foods.  She attributed the initial sx to fried food but she has cut out fried foods in the meantime and she still has had troubles.  Known lap band surgery.   Her stress is noted at work.  She has more financial difficulty since her roommate died.  She is more tearful.  Unclear how much of this is causing/worsening the GI sx.  Off wellbutrin in meantime, but not recently.  No SI/HI.  She is fatigued.   Pt opts in for HCV and HIV screening.  D/w pt re: routine screening.    BP has been good on recheck mult times.    Meds, vitals, and allergies reviewed.   ROS: See HPI.  Otherwise, noncontributory.  nad ncat Mmm Neck supple, no LA rrr ctab abd soft, not ttp, normal BS Ext w/o edema

## 2015-06-12 LAB — HEPATITIS C ANTIBODY: HCV Ab: NEGATIVE

## 2015-06-12 LAB — HIV ANTIBODY (ROUTINE TESTING W REFLEX): HIV 1&2 Ab, 4th Generation: NONREACTIVE

## 2015-06-12 LAB — TSH: TSH: 3.463 u[IU]/mL (ref 0.350–4.500)

## 2015-06-13 DIAGNOSIS — Z119 Encounter for screening for infectious and parasitic diseases, unspecified: Secondary | ICD-10-CM | POA: Insufficient documentation

## 2015-06-13 NOTE — Assessment & Plan Note (Signed)
Likely with upheaval and depression affecting her GI sx currently.  Fatigue also noted.  Benign exam.  See notes on labs.   Assuming labs unremarkable, then likely that getting back on wellbutrin would be beneficial.  D/w pt.  Okay for outpatient f/u.   no SI/HI.  She'll update me after med restart.  >25 minutes spent in face to face time with patient, >50% spent in counselling or coordination of care.

## 2015-06-13 NOTE — Assessment & Plan Note (Signed)
HIV and HCV screening d/w pt, see notes on labs.

## 2015-06-23 ENCOUNTER — Encounter: Payer: Self-pay | Admitting: Family Medicine

## 2015-09-17 ENCOUNTER — Ambulatory Visit: Payer: 59 | Admitting: Neurology

## 2015-11-05 ENCOUNTER — Ambulatory Visit (INDEPENDENT_AMBULATORY_CARE_PROVIDER_SITE_OTHER): Payer: 59 | Admitting: Family Medicine

## 2015-11-05 ENCOUNTER — Encounter: Payer: Self-pay | Admitting: Family Medicine

## 2015-11-05 VITALS — BP 140/90 | HR 92 | Temp 97.9°F | Ht 65.0 in | Wt 211.2 lb

## 2015-11-05 DIAGNOSIS — B9789 Other viral agents as the cause of diseases classified elsewhere: Principal | ICD-10-CM

## 2015-11-05 DIAGNOSIS — J069 Acute upper respiratory infection, unspecified: Secondary | ICD-10-CM | POA: Insufficient documentation

## 2015-11-05 MED ORDER — BENZONATATE 200 MG PO CAPS: 200.0000 mg | ORAL_CAPSULE | Freq: Two times a day (BID) | ORAL | Status: DC | PRN

## 2015-11-05 NOTE — Assessment & Plan Note (Signed)
No sign of bacterial illness. Reviewed viral timeline. Symtpomatic care. Mucolytic and cough supressant.

## 2015-11-05 NOTE — Patient Instructions (Signed)
Mucinex DM twice daily . Can use benzonatate (tessalon perles) for cough as needed.  rest, fluids.  Expect to improve some in 3-4 days, but likely will take 7-10 day before cough resolved. Call if new fever, increase in shortness of breath or not improving as expected.

## 2015-11-05 NOTE — Progress Notes (Signed)
Pre visit review using our clinic review tool, if applicable. No additional management support is needed unless otherwise documented below in the visit note. 

## 2015-11-05 NOTE — Progress Notes (Signed)
   Subjective:    Patient ID: Traci [name], female    DOB: 01-Mar-1956, 60 y.o.   MRN: [id number]  Cough This is a new problem. The current episode started yesterday. The problem has been rapidly worsening. The problem occurs constantly. The cough is productive of sputum. Associated symptoms include chest pain, headaches, nasal congestion and postnasal drip. Pertinent negatives include no ear congestion, ear pain, sore throat, shortness of breath or wheezing. The symptoms are aggravated by lying down (couldn't sleep given cough). Risk factors: nonsmoker. Treatments tried: benzonatate, asa. The treatment provided moderate relief. Her past medical history is significant for bronchitis. There is no history of asthma, COPD or environmental allergies.    Social History /Family History/Past Medical History reviewed and updated if needed.   Review of Systems  HENT: Positive for postnasal drip. Negative for ear pain and sore throat.   Respiratory: Positive for cough. Negative for shortness of breath and wheezing.   Cardiovascular: Positive for chest pain.  Allergic/Immunologic: Negative for environmental allergies.  Neurological: Positive for headaches.       Objective:   Physical Exam  Constitutional: Vital signs are normal. She appears well-developed and well-nourished. She is cooperative.  Non-toxic appearance. She does not appear ill. No distress.  HENT:  Head: Normocephalic.  Right Ear: Hearing, tympanic membrane, external ear and ear canal normal. Tympanic membrane is not erythematous, not retracted and not bulging.  Left Ear: Hearing, tympanic membrane, external ear and ear canal normal. Tympanic membrane is not erythematous, not retracted and not bulging.  Nose: Mucosal edema and rhinorrhea present. Right sinus exhibits no maxillary sinus tenderness and no frontal sinus tenderness. Left sinus exhibits no maxillary sinus tenderness and no frontal sinus tenderness.  Mouth/Throat: Uvula  is midline, oropharynx is clear and moist and mucous membranes are normal.  Eyes: Conjunctivae, EOM and lids are normal. Pupils are equal, round, and reactive to light. Lids are everted and swept, no foreign bodies found.  Neck: Trachea normal and normal range of motion. Neck supple. Carotid bruit is not present. No thyroid mass and no thyromegaly present.  Cardiovascular: Normal rate, regular rhythm, S1 normal, S2 normal, normal heart sounds, intact distal pulses and normal pulses.  Exam reveals no gallop and no friction rub.   No murmur heard. Pulmonary/Chest: Effort normal and breath sounds normal. No tachypnea. No respiratory distress. She has no decreased breath sounds. She has no wheezes. She has no rhonchi. She has no rales.  Frequent cough  Neurological: She is alert.  Skin: Skin is warm, dry and intact. No rash noted.  Psychiatric: Her speech is normal and behavior is normal. Judgment normal. Her mood appears not anxious. Cognition and memory are normal. She does not exhibit a depressed mood.          Assessment & Plan:

## 2016-01-18 ENCOUNTER — Other Ambulatory Visit: Payer: Self-pay

## 2016-01-18 DIAGNOSIS — Z1231 Encounter for screening mammogram for malignant neoplasm of breast: Secondary | ICD-10-CM

## 2016-01-24 ENCOUNTER — Encounter: Payer: Self-pay | Admitting: Family Medicine

## 2016-01-24 ENCOUNTER — Ambulatory Visit (INDEPENDENT_AMBULATORY_CARE_PROVIDER_SITE_OTHER): Payer: 59 | Admitting: Family Medicine

## 2016-01-24 VITALS — BP 142/94 | HR 87 | Temp 98.1°F | Wt 211.8 lb

## 2016-01-24 DIAGNOSIS — R0789 Other chest pain: Secondary | ICD-10-CM | POA: Diagnosis not present

## 2016-01-24 DIAGNOSIS — K219 Gastro-esophageal reflux disease without esophagitis: Secondary | ICD-10-CM | POA: Diagnosis not present

## 2016-01-24 MED ORDER — ALBUTEROL SULFATE HFA 108 (90 BASE) MCG/ACT IN AERS: 2.0000 | INHALATION_SPRAY | Freq: Four times a day (QID) | RESPIRATORY_TRACT | Status: DC | PRN

## 2016-01-24 MED ORDER — OMEPRAZOLE 20 MG PO CPDR: 20.0000 mg | DELAYED_RELEASE_CAPSULE | Freq: Every day | ORAL | Status: DC

## 2016-01-24 NOTE — Patient Instructions (Signed)
Likely heartburn causing the burping, changes with swallowing and airway symptoms (noisy breathing). Start taking prilosec and use the inhaler if needed.   Update me if not any better soon.  Take care.  Glad to see you.

## 2016-01-24 NOTE — Progress Notes (Signed)
Pre visit review using our clinic review tool, if applicable. No additional management support is needed unless otherwise documented below in the visit note.  Sx for the last few weeks at least, possibly months.  She feels like she episodically can't take a deep breath.  She has a sensation of "swallowing over something" with swallowing but no true dysphagia.  She can exercise with weights but her tolerance for walking is decreased.  She wakes up burping each AM.  She wheezes after walking but not after working out with weights.  No h/o asthma.  Some occ upper chest pain at rest but not with/during walking- only after walking and back at rest.  She burps a lot (recently) but didn't have typical heartburn sx after meals.  No abd pain.  She still has some R sided neck pain, along the SCM and R trap.      Still on baseline meds.  Not on GERD meds.  She has some AM hoarseness.    Meds, vitals, and allergies reviewed.   ROS: Per HPI unless specifically indicated in ROS section   GEN: nad, alert and oriented HEENT: mucous membranes moist. OP wnl, nasal exam wnl NECK: supple w/o LA, no stridor, R trap slightly tight but not ttp on exam.   CV: rrr.  PULM: ctab, no inc wob ABD: soft, +bs, not ttp EXT: no edema  EKG w/o change from prev.

## 2016-01-25 DIAGNOSIS — K219 Gastro-esophageal reflux disease without esophagitis: Secondary | ICD-10-CM | POA: Insufficient documentation

## 2016-01-25 NOTE — Assessment & Plan Note (Signed)
>  25 minutes spent in face to face time with patient, >50% spent in counselling or coordination of care Likely GERD causing a lot of her sx.  Likely heartburn causing the burping, changes with swallowing and airway symptoms.   Start taking prilosec and use SABA if needed, routine cautions given.  Update me if not any better soon.  EKG w/o changes, compared to prev.  The neck discomfort appears to be incidental and MSK in origin.

## 2016-02-02 ENCOUNTER — Ambulatory Visit
Admission: RE | Admit: 2016-02-02 | Discharge: 2016-02-02 | Disposition: A | Discharge status: Home / Self Care | Payer: 59 | Source: Ambulatory Visit

## 2016-02-02 DIAGNOSIS — Z1231 Encounter for screening mammogram for malignant neoplasm of breast: Secondary | ICD-10-CM

## 2016-04-17 ENCOUNTER — Encounter: Payer: Self-pay | Admitting: Family Medicine

## 2016-04-17 ENCOUNTER — Ambulatory Visit (INDEPENDENT_AMBULATORY_CARE_PROVIDER_SITE_OTHER): Payer: 59 | Admitting: Family Medicine

## 2016-04-17 VITALS — BP 132/90 | HR 101 | Temp 98.4°F | Wt 207.8 lb

## 2016-04-17 DIAGNOSIS — F329 Major depressive disorder, single episode, unspecified: Secondary | ICD-10-CM | POA: Diagnosis not present

## 2016-04-17 DIAGNOSIS — E039 Hypothyroidism, unspecified: Secondary | ICD-10-CM | POA: Diagnosis not present

## 2016-04-17 DIAGNOSIS — F32A Depression, unspecified: Secondary | ICD-10-CM

## 2016-04-17 DIAGNOSIS — R5383 Other fatigue: Secondary | ICD-10-CM

## 2016-04-17 LAB — CBC WITH DIFFERENTIAL/PLATELET
Basophils Absolute: 0 10*3/uL (ref 0.0–0.1)
Basophils Relative: 0.5 % (ref 0.0–3.0)
Eosinophils Absolute: 0.1 10*3/uL (ref 0.0–0.7)
Eosinophils Relative: 1.9 % (ref 0.0–5.0)
HCT: 36.8 % (ref 36.0–46.0)
Hemoglobin: 12.3 g/dL (ref 12.0–15.0)
Lymphocytes Relative: 18.9 % (ref 12.0–46.0)
Lymphs Abs: 1.4 10*3/uL (ref 0.7–4.0)
MCHC: 33.6 g/dL (ref 30.0–36.0)
MCV: 85.4 fl (ref 78.0–100.0)
Monocytes Absolute: 0.6 10*3/uL (ref 0.1–1.0)
Monocytes Relative: 8.4 % (ref 3.0–12.0)
Neutro Abs: 5.1 10*3/uL (ref 1.4–7.7)
Neutrophils Relative %: 70.3 % (ref 43.0–77.0)
Platelets: 285 10*3/uL (ref 150.0–400.0)
RBC: 4.3 Mil/uL (ref 3.87–5.11)
RDW: 14.4 % (ref 11.5–15.5)
WBC: 7.3 10*3/uL (ref 4.0–10.5)

## 2016-04-17 LAB — BASIC METABOLIC PANEL
BUN: 15 mg/dL (ref 6–23)
CO2: 25 mEq/L (ref 19–32)
Calcium: 9.5 mg/dL (ref 8.4–10.5)
Chloride: 105 mEq/L (ref 96–112)
Creatinine, Ser: 0.89 mg/dL (ref 0.40–1.20)
GFR: 68.4 mL/min (ref >60.00)
Glucose, Bld: 89 mg/dL (ref 70–99)
Potassium: 4.2 mEq/L (ref 3.5–5.1)
Sodium: 139 mEq/L (ref 135–145)

## 2016-04-17 LAB — TSH: TSH: 0.93 u[IU]/mL (ref 0.35–4.50)

## 2016-04-17 MED ORDER — ESCITALOPRAM OXALATE 10 MG PO TABS: 10.0000 mg | ORAL_TABLET | Freq: Every day | ORAL | 3 refills | Status: DC

## 2016-04-17 NOTE — Progress Notes (Signed)
She has been working out several times a week.  She is living alone.  Her mood is worse in the meantime, more tearful.  More irritable.  Still on wellbutrin.  No etoh.  No SI/HI.  Sleep okay but she is still exhausted, fatigued.  No fevers, no chills.  wellbutrin helped prev, "but I think it isn't helping now."  She got irritated at work and is working through that.    Meds, vitals, and allergies reviewed.   ROS: Per HPI unless specifically indicated in ROS section   GEN: nad, alert and oriented HEENT: mucous membranes moist NECK: supple w/o LA CV: rrr. PULM: ctab, no inc wob EXT: no edema Speech and judgement wnl.

## 2016-04-17 NOTE — Patient Instructions (Addendum)
Go to the lab on the way out.  We'll contact you with your lab report. Traci [name] will call about your referral. Stay on the wellbutrin for now.  Add on lexapro and update me in about 10 days, sooner if needed.  The lexapro will likely not have had time to work by that point.  Take care.  Glad to see you.

## 2016-04-17 NOTE — Progress Notes (Signed)
Pre visit review using our clinic review tool, if applicable. No additional management support is needed unless otherwise documented below in the visit note. 

## 2016-04-17 NOTE — Assessment & Plan Note (Signed)
D/w pt.  Reasonable to f/u with counseling, add on lexapro with routine cautions and timeline d/w pt.  She agrees.  She'll update me in about 10 days, sooner if needed. Still okay for outpatient f/u.  Would check routine labs today given the fatigue, though this could be from depression.

## 2016-04-17 NOTE — Assessment & Plan Note (Signed)
Would check routine labs today given the fatigue, though this could be from depression.

## 2016-04-24 ENCOUNTER — Telehealth: Payer: Self-pay | Admitting: Family Medicine

## 2016-04-25 ENCOUNTER — Ambulatory Visit: Payer: 59 | Admitting: Psychology

## 2016-04-27 ENCOUNTER — Telehealth: Payer: Self-pay | Admitting: Family Medicine

## 2016-04-27 NOTE — Telephone Encounter (Signed)
Left detailed message on voicemail to return call with response.

## 2016-04-27 NOTE — Telephone Encounter (Signed)
Please get update on patient re: lexapro start.  Thanks.

## 2016-04-29 ENCOUNTER — Other Ambulatory Visit: Payer: Self-pay | Admitting: Family Medicine

## 2016-05-01 NOTE — Telephone Encounter (Signed)
Left message on patient's voicemail to return call

## 2016-05-02 NOTE — Telephone Encounter (Signed)
Patient says she has been trying to take it along with her Wellbutrin and it seems to be making her sluggish.  A friend of hers told her to try taking it at night and she has been doing that but not for long enough to see if it's working.  She will update us next week.

## 2016-05-03 NOTE — Telephone Encounter (Signed)
Noted. Thanks.

## 2016-05-09 ENCOUNTER — Encounter: Payer: Self-pay | Admitting: Family Medicine

## 2016-05-10 ENCOUNTER — Other Ambulatory Visit: Payer: Self-pay | Admitting: Family Medicine

## 2016-05-10 ENCOUNTER — Telehealth: Payer: Self-pay

## 2016-05-10 NOTE — Telephone Encounter (Signed)
Pt left v/m; pt applying for intermittent FMLA and has questions about process for filling out FMLA paperwork. Pt request cb.

## 2016-05-11 NOTE — Telephone Encounter (Signed)
Left message asking pt to call office  °

## 2016-05-15 NOTE — Telephone Encounter (Signed)
Patient returned [name]'s call.  Patient asked for [name] to call her back at work-[phone]. Patient didn't get message until Friday night.  I let patient know [name]'s working in [place] and she said it's fine to call her back tomorrow.

## 2016-05-15 NOTE — Telephone Encounter (Signed)
spoke with pt need inter and for anxiety and depression

## 2016-05-18 ENCOUNTER — Telehealth: Payer: Self-pay | Admitting: Family Medicine

## 2016-05-18 NOTE — Telephone Encounter (Signed)
Left message to return call with update on home and cell #.

## 2016-05-18 NOTE — Telephone Encounter (Signed)
Please get update on patient.  Thanks. 

## 2016-05-19 NOTE — Telephone Encounter (Signed)
If some better, then I would continue as is.  I'll await the counseling results.  I'll fill out whatever she needs for FMLA.  Thanks.

## 2016-05-19 NOTE — Telephone Encounter (Signed)
Pt aware that I have not received paperwork °

## 2016-05-19 NOTE — Telephone Encounter (Signed)
Patient says she is some better.  She is on the Wellbutrin but could not take the other medication that she spoke with you about earlier.  She is seeing the Psychologist on the 25th for the second visit.  She is applying for intermittent FMLA.

## 2016-05-22 NOTE — Telephone Encounter (Signed)
Pt dropped off fmla paperwork to be filled out °

## 2016-05-23 NOTE — Telephone Encounter (Signed)
Paperwork in dr Lianne Bushyduncan's  IN BOX for review and signature

## 2016-05-23 NOTE — Telephone Encounter (Signed)
I'll work on the hard copy.  Thanks.  

## 2016-05-23 NOTE — Telephone Encounter (Signed)
Paperwork in dr Para Marchduncan in box

## 2016-05-26 NOTE — Telephone Encounter (Signed)
Paperwork faxed °Copy for file °Copy for scan °Copy for pt °Pt aware °

## 2016-05-29 ENCOUNTER — Ambulatory Visit (INDEPENDENT_AMBULATORY_CARE_PROVIDER_SITE_OTHER): Payer: 59 | Admitting: Psychology

## 2016-05-29 DIAGNOSIS — F4323 Adjustment disorder with mixed anxiety and depressed mood: Secondary | ICD-10-CM

## 2016-06-13 ENCOUNTER — Ambulatory Visit (INDEPENDENT_AMBULATORY_CARE_PROVIDER_SITE_OTHER): Payer: 59 | Admitting: Psychology

## 2016-06-13 DIAGNOSIS — F4323 Adjustment disorder with mixed anxiety and depressed mood: Secondary | ICD-10-CM | POA: Diagnosis not present

## 2016-07-20 NOTE — Telephone Encounter (Signed)
Opened in error

## 2016-07-21 ENCOUNTER — Other Ambulatory Visit: Payer: Self-pay | Admitting: Family Medicine

## 2016-08-20 ENCOUNTER — Other Ambulatory Visit: Payer: Self-pay | Admitting: Family Medicine

## 2016-10-10 ENCOUNTER — Ambulatory Visit (INDEPENDENT_AMBULATORY_CARE_PROVIDER_SITE_OTHER): Payer: 59 | Admitting: Psychology

## 2016-10-10 DIAGNOSIS — F4323 Adjustment disorder with mixed anxiety and depressed mood: Secondary | ICD-10-CM

## 2016-10-25 DIAGNOSIS — M25561 Pain in right knee: Secondary | ICD-10-CM | POA: Diagnosis not present

## 2016-10-25 DIAGNOSIS — Z96651 Presence of right artificial knee joint: Secondary | ICD-10-CM | POA: Diagnosis not present

## 2016-11-19 ENCOUNTER — Other Ambulatory Visit: Payer: Self-pay | Admitting: Family Medicine

## 2016-11-19 DIAGNOSIS — E039 Hypothyroidism, unspecified: Secondary | ICD-10-CM

## 2016-11-19 DIAGNOSIS — M8580 Other specified disorders of bone density and structure, unspecified site: Secondary | ICD-10-CM

## 2016-11-19 DIAGNOSIS — E785 Hyperlipidemia, unspecified: Secondary | ICD-10-CM

## 2016-11-21 ENCOUNTER — Telehealth: Payer: Self-pay | Admitting: Family Medicine

## 2016-11-21 ENCOUNTER — Inpatient Hospital Stay (HOSPITAL_COMMUNITY)
Admission: EM | Admit: 2016-11-21 | Discharge: 2016-11-24 | DRG: 287 | Disposition: A | Discharge status: Home / Self Care | Payer: 59 | Attending: Internal Medicine | Admitting: Internal Medicine

## 2016-11-21 ENCOUNTER — Encounter (HOSPITAL_COMMUNITY): Payer: Self-pay

## 2016-11-21 ENCOUNTER — Emergency Department (HOSPITAL_COMMUNITY): Payer: 59

## 2016-11-21 DIAGNOSIS — Z823 Family history of stroke: Secondary | ICD-10-CM

## 2016-11-21 DIAGNOSIS — Z885 Allergy status to narcotic agent status: Secondary | ICD-10-CM

## 2016-11-21 DIAGNOSIS — R51 Headache: Secondary | ICD-10-CM

## 2016-11-21 DIAGNOSIS — R0602 Shortness of breath: Secondary | ICD-10-CM | POA: Diagnosis not present

## 2016-11-21 DIAGNOSIS — F329 Major depressive disorder, single episode, unspecified: Secondary | ICD-10-CM | POA: Diagnosis present

## 2016-11-21 DIAGNOSIS — R079 Chest pain, unspecified: Secondary | ICD-10-CM | POA: Diagnosis present

## 2016-11-21 DIAGNOSIS — I200 Unstable angina: Secondary | ICD-10-CM | POA: Diagnosis not present

## 2016-11-21 DIAGNOSIS — R0789 Other chest pain: Secondary | ICD-10-CM | POA: Diagnosis not present

## 2016-11-21 DIAGNOSIS — Z833 Family history of diabetes mellitus: Secondary | ICD-10-CM

## 2016-11-21 DIAGNOSIS — F32A Depression, unspecified: Secondary | ICD-10-CM | POA: Diagnosis present

## 2016-11-21 DIAGNOSIS — Z9049 Acquired absence of other specified parts of digestive tract: Secondary | ICD-10-CM

## 2016-11-21 DIAGNOSIS — E039 Hypothyroidism, unspecified: Secondary | ICD-10-CM | POA: Diagnosis present

## 2016-11-21 DIAGNOSIS — E785 Hyperlipidemia, unspecified: Secondary | ICD-10-CM | POA: Diagnosis present

## 2016-11-21 DIAGNOSIS — G43909 Migraine, unspecified, not intractable, without status migrainosus: Secondary | ICD-10-CM | POA: Diagnosis present

## 2016-11-21 DIAGNOSIS — R519 Headache, unspecified: Secondary | ICD-10-CM

## 2016-11-21 DIAGNOSIS — I10 Essential (primary) hypertension: Secondary | ICD-10-CM

## 2016-11-21 DIAGNOSIS — Z808 Family history of malignant neoplasm of other organs or systems: Secondary | ICD-10-CM

## 2016-11-21 DIAGNOSIS — Z96651 Presence of right artificial knee joint: Secondary | ICD-10-CM | POA: Diagnosis present

## 2016-11-21 DIAGNOSIS — Z8673 Personal history of transient ischemic attack (TIA), and cerebral infarction without residual deficits: Secondary | ICD-10-CM

## 2016-11-21 DIAGNOSIS — M797 Fibromyalgia: Secondary | ICD-10-CM | POA: Diagnosis present

## 2016-11-21 DIAGNOSIS — Z8249 Family history of ischemic heart disease and other diseases of the circulatory system: Secondary | ICD-10-CM

## 2016-11-21 DIAGNOSIS — Z888 Allergy status to other drugs, medicaments and biological substances status: Secondary | ICD-10-CM

## 2016-11-21 DIAGNOSIS — K219 Gastro-esophageal reflux disease without esophagitis: Secondary | ICD-10-CM | POA: Diagnosis present

## 2016-11-21 DIAGNOSIS — R112 Nausea with vomiting, unspecified: Secondary | ICD-10-CM

## 2016-11-21 LAB — CBC
HCT: 38.6 % (ref 36.0–46.0)
Hemoglobin: 12.6 g/dL (ref 12.0–15.0)
MCH: 29.2 pg (ref 26.0–34.0)
MCHC: 32.6 g/dL (ref 30.0–36.0)
MCV: 89.6 fL (ref 78.0–100.0)
Platelets: 252 10*3/uL (ref 150–400)
RBC: 4.31 MIL/uL (ref 3.87–5.11)
RDW: 14 % (ref 11.5–15.5)
WBC: 7.9 10*3/uL (ref 4.0–10.5)

## 2016-11-21 LAB — D-DIMER, QUANTITATIVE: D-Dimer, Quant: 0.38 ug/mL-FEU (ref 0.00–0.50)

## 2016-11-21 LAB — BASIC METABOLIC PANEL
Anion gap: 9 (ref 5–15)
BUN: 14 mg/dL (ref 6–20)
CO2: 24 mmol/L (ref 22–32)
Calcium: 9.3 mg/dL (ref 8.9–10.3)
Chloride: 106 mmol/L (ref 101–111)
Creatinine, Ser: 1.03 mg/dL — ABNORMAL HIGH (ref 0.44–1.00)
GFR calc Af Amer: >60 mL/min (ref >60)
GFR calc non Af Amer: 57 mL/min — ABNORMAL LOW (ref >60)
Glucose, Bld: 93 mg/dL (ref 65–99)
Potassium: 4.3 mmol/L (ref 3.5–5.1)
Sodium: 139 mmol/L (ref 135–145)

## 2016-11-21 LAB — I-STAT TROPONIN, ED
Troponin i, poc: 0 ng/mL (ref 0.00–0.08)
Troponin i, poc: 0 ng/mL (ref 0.00–0.08)

## 2016-11-21 MED ORDER — ASPIRIN 81 MG PO CHEW
324.0000 mg | CHEWABLE_TABLET | Freq: Once | ORAL | Status: AC
  Administered 2016-11-21: 324 mg via ORAL
  Filled 2016-11-21: qty 4

## 2016-11-21 NOTE — ED Triage Notes (Signed)
Patient complains of chest pain with radiation to right arm since am, states that the pain and shortness of breath worse with exertion. No acute distress on arrival, alert and oriented

## 2016-11-21 NOTE — ED Provider Notes (Signed)
[facility]-EMER[facility]ENCY DEPT Provider Note   CSN: [id number] Arrival date & time: 11/21/16  1513  History   Chief Complaint Chief Complaint  Patient presents with  . Chest Pain   HPI   Traci [name] is an 61 y.o. female with h/o stroke, fibromyalgia, gastric banding with resolution of HLD, hypothyroidism, who presents to the ED for evaluation of chest pain and SOB. States she has had issues with chest pain and SOB "for a long time" but they have acutely worsened since Friday 3/16. Describes a pressure, heavy pain across her central and left chest. Does not radiate. Associated SOB. Feels winded walking to her car or after a long sentence. Pain is worse with exertion. Denies smoking. Denies recent travel. Denies leg pain or swelling. Denies fam hx of CAD. States she called her PCP for her symptoms earlier today and was told to come to the emergency room for further evaluation. She is not on any blood thinners.  Past Medical History:  Diagnosis Date  . Abnormal alkaline phosphatase test    with neg eval prev  . Arthritis   . Depression    Previously responded to Wellbutrin  . Headache(784.0)   . Hyperlipidemia   . Hypothyroidism   . Migraines   . Stroke (HCC) 12/2000  . Thyroid disease    Hypothyroidism    Past Surgical History:  Procedure Laterality Date  . CHOLECYSTECTOMY     ? 20 years ago  . LAPAROSCOPIC GASTRIC BANDING  2006  . TOTAL KNEE ARTHROPLASTY Right 11/26/2012   Procedure: TOTAL KNEE ARTHROPLASTY- right ;  Surgeon: Velna Ochs, MD;  Location: [facility] OR;  Service: Orthopedics;  Laterality: Right;    OB History    No data available       Home Medications    Prior to Admission medications   Medication Sig Start Date End Date Taking? Authorizing Provider  albuterol (PROVENTIL HFA;VENTOLIN HFA) 108 (90 Base) MCG/ACT inhaler Inhale 2 puffs into the lungs every 6 (six) hours as needed for wheezing or shortness of breath. 01/24/16   Joaquim Nam, MD  buPROPion  (WELLBUTRIN SR) 150 MG 12 hr tablet TAKE ONE TABLET BY MOUTH TWICE DAILY. 07/21/16   Joaquim Nam, MD  levothyroxine (SYNTHROID, LEVOTHROID) 150 MCG tablet TAKE ONE TABLET BY MOUTH DAILY EXCEPT FOR ONE-HALF TABLET ON SUNDAY AND WEDNESDAYS. TOTAL OF 6 TABLETS PER 7 DAYS 08/21/16   Joaquim Nam, MD  Multiple Vitamin (MULTIVITAMIN) tablet Take 1 tablet by mouth daily.     Historical Provider, MD  omeprazole (PRILOSEC) 20 MG capsule TAKE ONE CAPSULE BY MOUTH ONCE DAILY 05/01/16   Joaquim Nam, MD    Family History Family History  Problem Relation Age of Onset  . Stroke Mother   . Diabetes Mother   . Heart attack Father     Deceased, 61  . Cancer Other     Ovarian, Uterine  . Heart disease Other   . Melanoma Sister   . Healthy Brother     Social History Social History  Substance Use Topics  . Smoking status: Never Smoker  . Smokeless tobacco: Never Used  . Alcohol use No     Comment: "maybe one on new years"     Allergies   Codeine; Lexapro [escitalopram oxalate]; Meperidine hcl; Simvastatin; and Tramadol   Review of Systems Review of Systems 10 Systems reviewed and are negative for acute change except as noted in the HPI.   Physical Exam Updated Vital Signs  BP (!) 155/92   Pulse 87   Temp 97.9 F (36.6 C)   Resp 18   SpO2 98%   Physical Exam  Constitutional: She is oriented to person, place, and time.  Obese, NAD  HENT:  Right Ear: External ear normal.  Left Ear: External ear normal.  Nose: Nose normal.  Mouth/Throat: Oropharynx is clear and moist. No oropharyngeal exudate.  Eyes: Conjunctivae are normal.  Neck: Neck supple.  Cardiovascular: Normal rate, regular rhythm, normal heart sounds and intact distal pulses.   Pulmonary/Chest: Effort normal and breath sounds normal. No respiratory distress. She has no wheezes.  At times take a big breath after sentence  Abdominal: Soft. Bowel sounds are normal. She exhibits no distension. There is no tenderness.  There is no rebound and no guarding.  Musculoskeletal: She exhibits no edema.  No calf tenderness or LE edema  Lymphadenopathy:    She has no cervical adenopathy.  Neurological: She is alert and oriented to person, place, and time. No cranial nerve deficit.  Skin: Skin is warm and dry.  Psychiatric: She has a normal mood and affect.  Nursing note and vitals reviewed.    ED Treatments / Results  Labs (all labs ordered are listed, but only abnormal results are displayed) Labs Reviewed  BASIC METABOLIC PANEL - Abnormal; Notable for the following:       Result Value   Creatinine, Ser 1.03 (*)    GFR calc non Af Amer 57 (*)    All other components within normal limits  CBC  D-DIMER, QUANTITATIVE (NOT AT [facility])  I-STAT TROPOININ, ED  I-STAT TROPOININ, ED    EKG  EKG Interpretation  Date/Time:  Tuesday November 21 2016 21:54:42 EDT Ventricular Rate:  80 PR Interval:    QRS Duration: 96 QT Interval:  413 QTC Calculation: 477 R Axis:   49 Text Interpretation:  Sinus rhythm Abnormal inferior Q waves EKG appears unchanged from prior.  No STEMI Confirmed by [facility] Center[facility]EL[facility] MD, CHRISTOPHER [phone]([id number]) on 11/21/2016 10:59:42 PM       Radiology Dg Chest 2 View  Result Date: 11/21/2016 CLINICAL DATA:  Shortness of breath and chest pain EXAM: CHEST  2 VIEW COMPARISON:  September 05, 2014 FINDINGS: There is no edema or consolidation. Heart size and pulmonary vascularity are normal. No adenopathy. There is degenerative change in thoracic spine. No pneumothorax. IMPRESSION: No edema or consolidation. Electronically Signed   By: Bretta BangWilliam  Woodruff III M.D.   On: 11/21/2016 16:20    Procedures Procedures (including critical care time)  Medications Ordered in ED Medications  aspirin chewable tablet 324 mg (324 mg Oral Given 11/21/16 2239)     Initial Impression / Assessment and Plan / ED Course  I have reviewed the triage vital signs and the nursing notes.  Pertinent labs & imaging results that were  available during my care of the patient were reviewed by me and considered in my medical decision making (see chart for details).    HEART score 4. Will call medicine for admission for further evaluation of chest pain.  12:08 AM Dr. Clyde LundborgNiu to admit. Appreciate assistance.  Final Clinical Impressions(s) / ED Diagnoses   Final diagnoses:  Chest pain, unspecified type  SOB (shortness of breath)    New Prescriptions New Prescriptions   No medications on file     Carlene CoriaSerena Y Taevon Aschoff, Cordelia Poche-C 11/22/16 0008    Canary Brimhristopher J Tegeler, MD 11/22/16 1130

## 2016-11-21 NOTE — Telephone Encounter (Signed)
[facility] - Client TELEPHONE ADVICE RECORD [facility] Patient Name: Traci BottsMARY LYNN ANDERSO N DOB: [birth year] Initial Comment Caller states that she has been breathing heavy and short of breath. Her heart rate 106 and blood pressure was high. She was seen at another appointment today and was told to call and see if she can get an earlier appointment with Dr. Para Marchuncan. Nurse Assessment Nurse: Lane HackerHarley, RN, Elvin SoWindy Date/Time (Eastern Time): 11/21/2016 2:47:13 PM Confirm and document reason for call. If symptomatic, describe symptoms. ---Caller states that she has been breathing heavy and short of breath. Her heart rate 106 and blood pressure was high at 140/90's just a few minutes ago. She was seen at another appointment today and was told to call and see if she can get an earlier appointment with Dr. Para Marchuncan (currently has Tuesday appt). Lungs were clear at the MD visit. Does the patient have any new or worsening symptoms? ---Yes Will a triage be completed? ---Yes Related visit to physician within the last 2 weeks? ---Yes Does the PT have any chronic conditions? (i.e. diabetes, asthma, etc.) ---Yes List chronic conditions. ---CVA 2002, Fibromyalgia, arthritis, lost weight after lap band surgery Is this a behavioral health or substance abuse call? ---No Guidelines Guideline Title Affirmed Question Affirmed Notes Chest Pain [1] Chest pain lasts > 5 minutes AND [2] described as crushing, pressure-like, or heavy Final Disposition User Call EMS 911 Now [place], [place], Elvin SoWindy Comments Can't walk 1/2 block to her car without her difficulty breathing, chest pain, and starts to cough.  [facility] Disagree/Comply: Disagree Disagree/Comply Reason: Disagree with instructions 911 Outcome Documentation Rubie MaidWindy, Harley, RN Reason: Caller plans to drive herself to ED now. She understands advice of 911 and at least having someone else  drive, but declines.

## 2016-11-21 NOTE — Telephone Encounter (Signed)
Per chart review pt is at [place]. 

## 2016-11-22 ENCOUNTER — Encounter (HOSPITAL_COMMUNITY)
Admission: EM | Disposition: A | Discharge status: Home / Self Care | Payer: Self-pay | Source: Home / Self Care | Attending: Internal Medicine

## 2016-11-22 ENCOUNTER — Encounter (HOSPITAL_COMMUNITY): Payer: Self-pay | Admitting: Internal Medicine

## 2016-11-22 ENCOUNTER — Observation Stay (HOSPITAL_BASED_OUTPATIENT_CLINIC_OR_DEPARTMENT_OTHER): Payer: 59

## 2016-11-22 DIAGNOSIS — R079 Chest pain, unspecified: Secondary | ICD-10-CM

## 2016-11-22 DIAGNOSIS — I200 Unstable angina: Secondary | ICD-10-CM | POA: Diagnosis not present

## 2016-11-22 DIAGNOSIS — R0602 Shortness of breath: Secondary | ICD-10-CM

## 2016-11-22 DIAGNOSIS — I10 Essential (primary) hypertension: Secondary | ICD-10-CM

## 2016-11-22 DIAGNOSIS — G43909 Migraine, unspecified, not intractable, without status migrainosus: Secondary | ICD-10-CM | POA: Diagnosis present

## 2016-11-22 DIAGNOSIS — E039 Hypothyroidism, unspecified: Secondary | ICD-10-CM

## 2016-11-22 DIAGNOSIS — E785 Hyperlipidemia, unspecified: Secondary | ICD-10-CM

## 2016-11-22 DIAGNOSIS — K219 Gastro-esophageal reflux disease without esophagitis: Secondary | ICD-10-CM | POA: Diagnosis not present

## 2016-11-22 HISTORY — PX: LEFT HEART CATH AND CORONARY ANGIOGRAPHY: CATH118249

## 2016-11-22 LAB — LIPID PANEL
Cholesterol: 188 mg/dL (ref 0–200)
HDL: 28 mg/dL — ABNORMAL LOW (ref >40)
LDL Cholesterol: 112 mg/dL — ABNORMAL HIGH (ref 0–99)
Total CHOL/HDL Ratio: 6.7 RATIO
Triglycerides: 239 mg/dL — ABNORMAL HIGH (ref <150)
VLDL: 48 mg/dL — ABNORMAL HIGH (ref 0–40)

## 2016-11-22 LAB — TROPONIN I
Troponin I: <0.03 ng/mL (ref <0.03)
Troponin I: <0.03 ng/mL (ref <0.03)
Troponin I: <0.03 ng/mL (ref <0.03)

## 2016-11-22 LAB — HIV ANTIBODY (ROUTINE TESTING W REFLEX): HIV Screen 4th Generation wRfx: NONREACTIVE

## 2016-11-22 LAB — ECHOCARDIOGRAM COMPLETE
Height: 65 in
Weight: 3318.4 oz

## 2016-11-22 LAB — PROTIME-INR
INR: 1.03
Prothrombin Time: 13.5 seconds (ref 11.4–15.2)

## 2016-11-22 LAB — MRSA PCR SCREENING: MRSA by PCR: NEGATIVE

## 2016-11-22 SURGERY — LEFT HEART CATH AND CORONARY ANGIOGRAPHY
Anesthesia: LOCAL

## 2016-11-22 MED ORDER — ASPIRIN 81 MG PO CHEW: 81.0000 mg | CHEWABLE_TABLET | ORAL | Status: DC

## 2016-11-22 MED ORDER — SODIUM CHLORIDE 0.9 % WEIGHT BASED INFUSION: 1.0000 mL/kg/h | INTRAVENOUS | Status: DC

## 2016-11-22 MED ORDER — HEPARIN SODIUM (PORCINE) 1000 UNIT/ML IJ SOLN
INTRAMUSCULAR | Status: DC | PRN
  Administered 2016-11-22: 5000 [IU] via INTRAVENOUS

## 2016-11-22 MED ORDER — NITROGLYCERIN IN D5W 200-5 MCG/ML-% IV SOLN
2.0000 ug/min | INTRAVENOUS | Status: DC
  Administered 2016-11-22: 5 ug/min via INTRAVENOUS
  Filled 2016-11-22: qty 250

## 2016-11-22 MED ORDER — CARVEDILOL 3.125 MG PO TABS
3.1250 mg | ORAL_TABLET | Freq: Two times a day (BID) | ORAL | Status: DC
  Administered 2016-11-22 – 2016-11-24 (×4): 3.125 mg via ORAL
  Filled 2016-11-22 (×4): qty 1

## 2016-11-22 MED ORDER — IOPAMIDOL (ISOVUE-370) INJECTION 76%
INTRAVENOUS | Status: DC | PRN
  Administered 2016-11-22: 55 mL via INTRA_ARTERIAL

## 2016-11-22 MED ORDER — SODIUM CHLORIDE 0.9% FLUSH: 3.0000 mL | Freq: Two times a day (BID) | INTRAVENOUS | Status: DC

## 2016-11-22 MED ORDER — SODIUM CHLORIDE 0.9% FLUSH: 3.0000 mL | INTRAVENOUS | Status: DC | PRN

## 2016-11-22 MED ORDER — ONDANSETRON HCL 4 MG/2ML IJ SOLN
4.0000 mg | Freq: Four times a day (QID) | INTRAMUSCULAR | Status: DC | PRN
  Administered 2016-11-23 (×2): 4 mg via INTRAVENOUS
  Filled 2016-11-22 (×2): qty 2

## 2016-11-22 MED ORDER — ZOLPIDEM TARTRATE 5 MG PO TABS: 5.0000 mg | ORAL_TABLET | Freq: Every evening | ORAL | Status: DC | PRN

## 2016-11-22 MED ORDER — SODIUM CHLORIDE 0.9 % IV SOLN: 250.0000 mL | INTRAVENOUS | Status: DC | PRN

## 2016-11-22 MED ORDER — SODIUM CHLORIDE 0.9% FLUSH
3.0000 mL | Freq: Two times a day (BID) | INTRAVENOUS | Status: DC
  Administered 2016-11-22 – 2016-11-24 (×3): 3 mL via INTRAVENOUS

## 2016-11-22 MED ORDER — LEVOTHYROXINE SODIUM 75 MCG PO TABS
150.0000 ug | ORAL_TABLET | Freq: Every day | ORAL | Status: DC
  Administered 2016-11-22 – 2016-11-24 (×3): 150 ug via ORAL
  Filled 2016-11-22 (×3): qty 2

## 2016-11-22 MED ORDER — ENOXAPARIN SODIUM 40 MG/0.4ML ~~LOC~~ SOLN
40.0000 mg | Freq: Every day | SUBCUTANEOUS | Status: DC
  Filled 2016-11-22 (×2): qty 0.4

## 2016-11-22 MED ORDER — ASPIRIN 81 MG PO CHEW
324.0000 mg | CHEWABLE_TABLET | Freq: Every day | ORAL | Status: DC
  Administered 2016-11-22: 324 mg via ORAL
  Filled 2016-11-22: qty 4

## 2016-11-22 MED ORDER — PANTOPRAZOLE SODIUM 40 MG PO TBEC
40.0000 mg | DELAYED_RELEASE_TABLET | Freq: Every day | ORAL | Status: DC
  Administered 2016-11-22 – 2016-11-24 (×3): 40 mg via ORAL
  Filled 2016-11-22 (×3): qty 1

## 2016-11-22 MED ORDER — VITAMIN B-12 100 MCG PO TABS
100.0000 ug | ORAL_TABLET | Freq: Every day | ORAL | Status: DC
  Administered 2016-11-22 – 2016-11-24 (×3): 100 ug via ORAL
  Filled 2016-11-22 (×3): qty 1

## 2016-11-22 MED ORDER — ALBUTEROL SULFATE (2.5 MG/3ML) 0.083% IN NEBU: 2.5000 mg | INHALATION_SOLUTION | Freq: Four times a day (QID) | RESPIRATORY_TRACT | Status: DC | PRN

## 2016-11-22 MED ORDER — IOPAMIDOL (ISOVUE-370) INJECTION 76%
INTRAVENOUS | Status: AC
  Filled 2016-11-22: qty 100

## 2016-11-22 MED ORDER — SODIUM CHLORIDE 0.9 % IV SOLN: INTRAVENOUS | Status: AC

## 2016-11-22 MED ORDER — VERAPAMIL HCL 2.5 MG/ML IV SOLN
INTRAVENOUS | Status: AC
  Filled 2016-11-22: qty 2

## 2016-11-22 MED ORDER — MORPHINE SULFATE (PF) 4 MG/ML IV SOLN: 2.0000 mg | INTRAVENOUS | Status: DC | PRN

## 2016-11-22 MED ORDER — ASPIRIN-ACETAMINOPHEN-CAFFEINE 250-250-65 MG PO TABS
1.0000 | ORAL_TABLET | Freq: Three times a day (TID) | ORAL | Status: DC | PRN
  Administered 2016-11-23: 1 via ORAL
  Filled 2016-11-22 (×3): qty 1

## 2016-11-22 MED ORDER — LIDOCAINE HCL (PF) 1 % IJ SOLN
INTRAMUSCULAR | Status: AC
  Filled 2016-11-22: qty 30

## 2016-11-22 MED ORDER — MIDAZOLAM HCL 2 MG/2ML IJ SOLN
INTRAMUSCULAR | Status: AC
  Filled 2016-11-22: qty 2

## 2016-11-22 MED ORDER — LIDOCAINE HCL (PF) 1 % IJ SOLN
INTRAMUSCULAR | Status: DC | PRN
  Administered 2016-11-22: 5 mL

## 2016-11-22 MED ORDER — MIDAZOLAM HCL 2 MG/2ML IJ SOLN
INTRAMUSCULAR | Status: DC | PRN
  Administered 2016-11-22: 1 mg via INTRAVENOUS

## 2016-11-22 MED ORDER — HEPARIN (PORCINE) IN NACL 2-0.9 UNIT/ML-% IJ SOLN
INTRAMUSCULAR | Status: DC | PRN
  Administered 2016-11-22: 1000 mL

## 2016-11-22 MED ORDER — NITROGLYCERIN 0.4 MG SL SUBL
0.4000 mg | SUBLINGUAL_TABLET | SUBLINGUAL | Status: DC | PRN
  Administered 2016-11-22: 0.4 mg via SUBLINGUAL
  Filled 2016-11-22: qty 1

## 2016-11-22 MED ORDER — ACETAMINOPHEN 325 MG PO TABS
650.0000 mg | ORAL_TABLET | ORAL | Status: DC | PRN
  Administered 2016-11-22 – 2016-11-23 (×3): 650 mg via ORAL
  Filled 2016-11-22 (×3): qty 2

## 2016-11-22 MED ORDER — HYDROMORPHONE HCL 1 MG/ML IJ SOLN: 1.0000 mg | INTRAMUSCULAR | Status: DC | PRN

## 2016-11-22 MED ORDER — SODIUM CHLORIDE 0.9 % IV SOLN
INTRAVENOUS | Status: DC
Start: 2016-11-22 — End: 2016-11-23
  Administered 2016-11-22 (×2): via INTRAVENOUS

## 2016-11-22 MED ORDER — SODIUM CHLORIDE 0.9 % WEIGHT BASED INFUSION: 3.0000 mL/kg/h | INTRAVENOUS | Status: DC

## 2016-11-22 MED ORDER — VERAPAMIL HCL 2.5 MG/ML IV SOLN
INTRAVENOUS | Status: DC | PRN
  Administered 2016-11-22: 17:00:00 via INTRA_ARTERIAL

## 2016-11-22 MED ORDER — FENTANYL CITRATE (PF) 100 MCG/2ML IJ SOLN
INTRAMUSCULAR | Status: AC
  Filled 2016-11-22: qty 2

## 2016-11-22 MED ORDER — FUROSEMIDE 10 MG/ML IJ SOLN: 60.0000 mg | Freq: Once | INTRAMUSCULAR | Status: DC

## 2016-11-22 MED ORDER — HYDRALAZINE HCL 20 MG/ML IJ SOLN: 5.0000 mg | INTRAMUSCULAR | Status: DC | PRN

## 2016-11-22 MED ORDER — ALPRAZOLAM 0.25 MG PO TABS: 0.2500 mg | ORAL_TABLET | Freq: Two times a day (BID) | ORAL | Status: DC | PRN

## 2016-11-22 MED ORDER — HEPARIN SODIUM (PORCINE) 1000 UNIT/ML IJ SOLN
INTRAMUSCULAR | Status: AC
  Filled 2016-11-22: qty 1

## 2016-11-22 MED ORDER — FENTANYL CITRATE (PF) 100 MCG/2ML IJ SOLN
INTRAMUSCULAR | Status: DC | PRN
  Administered 2016-11-22: 25 ug via INTRAVENOUS

## 2016-11-22 MED ORDER — HEPARIN (PORCINE) IN NACL 2-0.9 UNIT/ML-% IJ SOLN
INTRAMUSCULAR | Status: AC
Start: 2016-11-22 — End: 2016-11-22
  Filled 2016-11-22: qty 1000

## 2016-11-22 MED ORDER — BUPROPION HCL ER (SR) 150 MG PO TB12
150.0000 mg | ORAL_TABLET | Freq: Two times a day (BID) | ORAL | Status: DC
  Administered 2016-11-22 – 2016-11-24 (×5): 150 mg via ORAL
  Filled 2016-11-22 (×5): qty 1

## 2016-11-22 MED ORDER — ATORVASTATIN CALCIUM 20 MG PO TABS
20.0000 mg | ORAL_TABLET | Freq: Every day | ORAL | Status: DC
  Administered 2016-11-22: 20 mg via ORAL
  Filled 2016-11-22: qty 1

## 2016-11-22 MED ORDER — ADULT MULTIVITAMIN W/MINERALS CH
1.0000 | ORAL_TABLET | Freq: Every day | ORAL | Status: DC
  Administered 2016-11-22 – 2016-11-24 (×3): 1 via ORAL
  Filled 2016-11-22 (×3): qty 1

## 2016-11-22 SURGICAL SUPPLY — 11 items
CATH OPTITORQUE JACKY 4.0 5F (CATHETERS) ×1 IMPLANT
COVER PRB 48X5XTLSCP FOLD TPE (BAG) IMPLANT
COVER PROBE 5X48 (BAG) ×2
DEVICE RAD COMP TR BAND LRG (VASCULAR PRODUCTS) ×1 IMPLANT
GLIDESHEATH SLEND SS 6F .021 (SHEATH) ×1 IMPLANT
GUIDEWIRE INQWIRE 1.5J.035X260 (WIRE) IMPLANT
INQWIRE 1.5J .035X260CM (WIRE) ×2
KIT HEART LEFT (KITS) ×2 IMPLANT
PACK CARDIAC CATHETERIZATION (CUSTOM PROCEDURE TRAY) ×2 IMPLANT
TRANSDUCER W/STOPCOCK (MISCELLANEOUS) ×2 IMPLANT
TUBING CIL FLEX 10 FLL-RA (TUBING) ×2 IMPLANT

## 2016-11-22 NOTE — Telephone Encounter (Signed)
Noted. Thanks.

## 2016-11-22 NOTE — H&P (Signed)
History and Physical    Traci [name] ZOX:[id number] DOB: October 15, 1955 DOA: 11/21/2016  Referring MD/NP/PA:   PCP: Traci Givens, MD   Patient coming from:  The patient is coming from home.  At baseline, pt is independent for most of ADL.  Chief Complaint: chest pain  HPI: Traci [name] is a 61 y.o. female with medical history significant of hyperlipidemia, GERD, hypothyroidism, depression, stroke, migraine headache, who presents with chest pain and headache.  Patient states that she has been having intermittent chest pain in the past 3 weeks. Since Friday, her chest pain has become worse and more frequent. Her chest pain is located in substernal area, 5 out of 10 in severity, pressure-like feeling, radiating to the right arm, it is exertional. It is associated with shortness of breath. She has some mild dry cough, but no fever or chills. Patient also reports nausea, but no vomiting, abdominal pain, diarrhea. Denies symptoms of UTI. She also has headache which she attributes to migraine headache. She states that she takes Excedrin with good relief when she has migraine headache.  ED Course: pt was found to have negative d-dimer, negative troponin, WBC 7.9, creatinine 1.03, temperature normal, no tachycardia, section 95% on room air, negative chest x-ray. Patient is placed on stepdown for observation. NTG gtt was started.   Review of Systems:   General: no fevers, chills, no changes in body weight, has HA and fatigue HEENT: no blurry vision, hearing changes or sore throat Respiratory: has dyspnea, coughing, no wheezing CV: has chest pain, no palpitations GI: no nausea, vomiting, abdominal pain, diarrhea, constipation GU: no dysuria, burning on urination, increased urinary frequency, hematuria  Ext: no leg edema Neuro: no unilateral weakness, numbness, or tingling, no vision change or hearing loss Skin: no rash, no skin tear. MSK: No muscle spasm, no deformity, no limitation of  range of movement in spin Heme: No easy bruising.  Travel history: No recent long distant travel.  Allergy:  Allergies  Allergen Reactions  . Lexapro [Escitalopram Oxalate] Other (See Comments)    Fatigue, sedation  . Meperidine Hcl Other (See Comments)    Cardiac arrest and syncope  . Simvastatin Other (See Comments)    REACTION: muscle aches  . Codeine Itching  . Tramadol Itching    Past Medical History:  Diagnosis Date  . Abnormal alkaline phosphatase test    with neg eval prev  . Arthritis   . Depression    Previously responded to Wellbutrin  . Headache(784.0)   . Hyperlipidemia   . Hypothyroidism   . Migraines   . Stroke (HCC) 12/2000  . Thyroid disease    Hypothyroidism    Past Surgical History:  Procedure Laterality Date  . CHOLECYSTECTOMY     ? 20 years ago  . LAPAROSCOPIC GASTRIC BANDING  2006  . TOTAL KNEE ARTHROPLASTY Right 11/26/2012   Procedure: TOTAL KNEE ARTHROPLASTY- right ;  Surgeon: Velna Ochs, MD;  Location: [facility] OR;  Service: Orthopedics;  Laterality: Right;    Social History:  reports that she has never smoked. She has never used smokeless tobacco. She reports that she does not drink alcohol or use drugs.  Family History:  Family History  Problem Relation Age of Onset  . Stroke Mother   . Diabetes Mother   . Heart attack Father     Deceased, 58  . Cancer Other     Ovarian, Uterine  . Heart disease Other   . Melanoma Sister   . Healthy Brother  Prior to Admission medications   Medication Sig Start Date End Date Taking? Authorizing Provider  albuterol (PROVENTIL HFA;VENTOLIN HFA) 108 (90 Base) MCG/ACT inhaler Inhale 2 puffs into the lungs every 6 (six) hours as needed for wheezing or shortness of breath. 01/24/16  Yes Joaquim Nam, MD  buPROPion (WELLBUTRIN SR) 150 MG 12 hr tablet TAKE ONE TABLET BY MOUTH TWICE DAILY. 07/21/16  Yes Joaquim Nam, MD  cyanocobalamin 100 MCG tablet Take 100 mcg by mouth daily.   Yes Historical  Provider, MD  levothyroxine (SYNTHROID, LEVOTHROID) 150 MCG tablet TAKE ONE TABLET BY MOUTH DAILY EXCEPT FOR ONE-HALF TABLET ON SUNDAY AND WEDNESDAYS. TOTAL OF 6 TABLETS PER 7 DAYS Patient taking differently: TAKE ONE TABLET BY MOUTH DAILY. 08/21/16  Yes Joaquim Nam, MD  Multiple Vitamin (MULTIVITAMIN) tablet Take 1 tablet by mouth daily.    Yes Historical Provider, MD  omeprazole (PRILOSEC) 20 MG capsule TAKE ONE CAPSULE BY MOUTH ONCE DAILY 05/01/16  Yes Joaquim Nam, MD    Physical Exam: Vitals:   11/22/16 0115 11/22/16 0130 11/22/16 0227 11/22/16 0758  BP: 124/78 121/70 (!) 151/97 (!) 170/99  Pulse: 100 88 81 79  Resp: 20 (!) 22 20   Temp:   97.9 F (36.6 C) 97.8 F (36.6 C)  TempSrc:   Oral Oral  SpO2: 96% 94% 98%   Weight:   94.1 kg (207 lb 6.4 oz)   Height:   5\' 5"  (1.651 m)    General: Not in acute distress HEENT:       Eyes: PERRL, EOMI, no scleral icterus.       ENT: No discharge from the ears and nose, no pharynx injection, no tonsillar enlargement.        Neck: No JVD, no bruit, no mass felt. Heme: No neck lymph node enlargement. Cardiac: S1/S2, RRR, No murmurs, No gallops or rubs. Respiratory:. No rales, wheezing, rhonchi or rubs. GI: Soft, nondistended, nontender, no rebound pain, no organomegaly, BS present. GU: No hematuria Ext: No pitting leg edema bilaterally. 2+DP/PT pulse bilaterally. Musculoskeletal: No joint deformities, No joint redness or warmth, no limitation of ROM in spin. Skin: No rashes.  Neuro: Alert, oriented X3, cranial nerves II-XII grossly intact, moves all extremities normally. Psych: Patient is not psychotic, no suicidal or hemocidal ideation.  Labs on Admission: I have personally reviewed following labs and imaging studies  CBC:  Recent Labs Lab 11/21/16 1604  WBC 7.9  HGB 12.6  HCT 38.6  MCV 89.6  PLT 252   Basic Metabolic Panel:  Recent Labs Lab 11/21/16 1604  NA 139  K 4.3  CL 106  CO2 24  GLUCOSE 93  BUN 14    CREATININE 1.03*  CALCIUM 9.3   GFR: Estimated Creatinine Clearance: 64.2 mL/min (A) (by C-G formula based on SCr of 1.03 mg/dL (H)). Liver Function Tests: No results for input(s): AST, ALT, ALKPHOS, BILITOT, PROT, ALBUMIN in the last 168 hours. No results for input(s): LIPASE, AMYLASE in the last 168 hours. No results for input(s): AMMONIA in the last 168 hours. Coagulation Profile: No results for input(s): INR, PROTIME in the last 168 hours. Cardiac Enzymes:  Recent Labs Lab 11/22/16 0117 11/22/16 0722  TROPONINI <0.03 <0.03   BNP (last 3 results) No results for input(s): PROBNP in the last 8760 hours. HbA1C: No results for input(s): HGBA1C in the last 72 hours. CBG: No results for input(s): GLUCAP in the last 168 hours. Lipid Profile:  Recent Labs  11/22/16 0117  CHOL 188  HDL 28*  LDLCALC 112*  TRIG 239*  CHOLHDL 6.7   Thyroid Function Tests: No results for input(s): TSH, T4TOTAL, FREET4, T3FREE, THYROIDAB in the last 72 hours. Anemia Panel: No results for input(s): VITAMINB12, FOLATE, FERRITIN, TIBC, IRON, RETICCTPCT in the last 72 hours. Urine analysis:    Component Value Date/Time   COLORURINE YELLOW 10/27/2014 1922   APPEARANCEUR CLEAR 10/27/2014 1922   LABSPEC 1.029 10/27/2014 1922   PHURINE 6.0 10/27/2014 1922   GLUCOSEU NEGATIVE 10/27/2014 1922   HGBUR TRACE (A) 10/27/2014 1922   BILIRUBINUR NEGATIVE 10/27/2014 1922   BILIRUBINUR Neg 06/08/2011 0826   KETONESUR 15 (A) 10/27/2014 1922   PROTEINUR NEGATIVE 10/27/2014 1922   UROBILINOGEN 0.2 10/27/2014 1922   NITRITE NEGATIVE 10/27/2014 1922   LEUKOCYTESUR NEGATIVE 10/27/2014 1922   Sepsis Labs: @LABRCNTIP (procalcitonin:4,lacticidven:4) ) Recent Results (from the past 240 hour(s))  MRSA PCR Screening     Status: None   Collection Time: 11/22/16  2:28 AM  Result Value Ref Range Status   MRSA by PCR NEGATIVE NEGATIVE Final    Comment:        The GeneXpert MRSA Assay (FDA approved for NASAL  specimens only), is one component of a comprehensive MRSA colonization surveillance program. It is not intended to diagnose MRSA infection nor to guide or monitor treatment for MRSA infections.      Radiological Exams on Admission: Dg Chest 2 View  Result Date: 11/21/2016 CLINICAL DATA:  Shortness of breath and chest pain EXAM: CHEST  2 VIEW COMPARISON:  September 05, 2014 FINDINGS: There is no edema or consolidation. Heart size and pulmonary vascularity are normal. No adenopathy. There is degenerative change in thoracic spine. No pneumothorax. IMPRESSION: No edema or consolidation. Electronically Signed   By: Bretta BangWilliam  Woodruff III M.D.   On: 11/21/2016 16:20     EKG: Independently reviewed. Sinus rhythm, QTC 477, no ischemic change. Assessment/Plan Principal Problem:   Chest pain Active Problems:   Hypothyroidism   HLD (hyperlipidemia)   Depression   GERD (gastroesophageal reflux disease)   Migraines   Chest pain: D-dimer negative, less likely to have a PE. Chest x-ray negative for infiltration. Potential differential diagnosis ACS, but patient's troponin has been negative 2.  - will place on Tele bed for obs - start IV NTG gtt - cycle CE q6 x3 and repeat her EKG in the am  - Nitroglycerin, dilaudid, and aspirin. - Risk factor stratification: will check FLP, UDS and A1C  - 2d echo - please call Card in AM  Hypothyroidism: Last TSH was 0.93 on 04/17/16 -Continue home Synthroid  HLD: Last LDL was not on record. Pt is not taking meds at home -Check FLP  GERD: -Protonix  Depression: Stable, no suicidal or homicidal ideations. -Continue home medications: Wellbutrin  Migraines: -When necessary Excedrin   DVT ppx: sQ Lovenox Code Status: Full code Family Communication: None at bed side.  Disposition Plan:  Anticipate discharge back to previous home environment Consults called:  none Admission status: SDU/inpation       Date of Service 11/22/2016    Lorretta HarpIU,  [place] [facility] Pager [phone]  If 7PM-7AM, please contact night-coverage [email or web address] Password [email or web address] 11/22/2016, 9:18 AM

## 2016-11-22 NOTE — H&P (View-Only) (Signed)
 CARDIOLOGY CONSULT NOTE   Patient ID: Traci [name] MRN: [id number] DOB/AGE: 10/12/1955 61 y.o.  Admit date: 11/21/2016  Primary Physician   Graham Duncan, MD Primary Cardiologist   New Reason for Consultation  CP Requesting Physician  Dr. Vann  HPI: Traci [name] is a 61 y.o. female with a history of CVA, HLD, hypothyroidism, fibromyalgia and gastric banding presents for chest pain.   Prior hx of HTN however not on any regiment for years. Hx of exertional dyspnea x 2-3 months. Recently worsen and noted chest tightness/pressure. Both resolved with Rest. Acutely worsened since Friday 3/16 when she had a migraine episode. She did not got to work on Monday due to poor feeling and weak. Had few episode of vomiting. Yesterday while at work she continue to feel poorly. She had a episode of severe shortness of breath and chest pressure radiating all across her chest. No radiation to shoulder or back. This was her worst episode. She called her PCP and advised to go to the emergency room for further evaluation.   Her symptoms has worsened with climbing stairs. She goes to [employer] 3 times/week and does stretching exercises. She has not had recent shortness of breath and chest tightness with this activity as well.  Non smoker.  No recent travel or surgery. Father had a massive heart attack at age 58. Mother had CHF.  EKG showed NSR @ rate of 80 bpm. POC troponin x 2 negative. Troponin I x 2 negative. Scr 1.03. Normal electrolytes and d-dimer. CXR normal.  11/22/2016: Cholesterol 188; HDL 28; LDL Cholesterol 112; Triglycerides 239; VLDL 48   Blood pressure of 146/86 on presentation. Now trending up. Last reading 170/99.  Past Medical History:  Diagnosis Date  . Abnormal alkaline phosphatase test    with neg eval prev  . Arthritis   . Depression    Previously responded to Wellbutrin  . Headache(784.0)   . Hyperlipidemia   . Hypothyroidism   . Migraines   . Stroke (HCC)  12/2000  . Thyroid disease    Hypothyroidism     Past Surgical History:  Procedure Laterality Date  . CHOLECYSTECTOMY     ? 20 years ago  . LAPAROSCOPIC GASTRIC BANDING  2006  . TOTAL KNEE ARTHROPLASTY Right 11/26/2012   Procedure: TOTAL KNEE ARTHROPLASTY- right ;  Surgeon: Peter G Dalldorf, MD;  Location: [facility] OR;  Service: Orthopedics;  Laterality: Right;    Allergies  Allergen Reactions  . Lexapro [Escitalopram Oxalate] Other (See Comments)    Fatigue, sedation  . Meperidine Hcl Other (See Comments)    Cardiac arrest and syncope  . Simvastatin Other (See Comments)    REACTION: muscle aches  . Codeine Itching  . Tramadol Itching    I have reviewed the patient's current medications . aspirin  324 mg Oral Daily  . buPROPion  150 mg Oral BID  . enoxaparin (LOVENOX) injection  40 mg Subcutaneous Daily  . levothyroxine  150 mcg Oral QAC breakfast  . multivitamin with minerals  1 tablet Oral Daily  . pantoprazole  40 mg Oral Daily  . cyanocobalamin  100 mcg Oral Daily   . sodium chloride 75 mL/hr at 11/22/16 0347  . nitroGLYCERIN 5 mcg/min (11/22/16 0348)   acetaminophen, albuterol, ALPRAZolam, aspirin-acetaminophen-caffeine, HYDROmorphone (DILAUDID) injection, nitroGLYCERIN, ondansetron (ZOFRAN) IV, zolpidem  Prior to Admission medications   Medication Sig Start Date End Date Taking? Authorizing Provider  albuterol (PROVENTIL HFA;VENTOLIN HFA) 108 (90 Base) MCG/ACT inhaler Inhale 2 puffs   into the lungs every 6 (six) hours as needed for wheezing or shortness of breath. 01/24/16  Yes Graham S Duncan, MD  buPROPion (WELLBUTRIN SR) 150 MG 12 hr tablet TAKE ONE TABLET BY MOUTH TWICE DAILY. 07/21/16  Yes Graham S Duncan, MD  cyanocobalamin 100 MCG tablet Take 100 mcg by mouth daily.   Yes Historical Provider, MD  levothyroxine (SYNTHROID, LEVOTHROID) 150 MCG tablet TAKE ONE TABLET BY MOUTH DAILY EXCEPT FOR ONE-HALF TABLET ON SUNDAY AND WEDNESDAYS. TOTAL OF 6 TABLETS PER 7  DAYS Patient taking differently: TAKE ONE TABLET BY MOUTH DAILY. 08/21/16  Yes Graham S Duncan, MD  Multiple Vitamin (MULTIVITAMIN) tablet Take 1 tablet by mouth daily.    Yes Historical Provider, MD  omeprazole (PRILOSEC) 20 MG capsule TAKE ONE CAPSULE BY MOUTH ONCE DAILY 05/01/16  Yes Graham S Duncan, MD     Social History   Social History  . Marital status: Single    Spouse name: N/A  . Number of children: 0  . Years of education: N/A   Occupational History  . Paralegal Unemployed   Social History Main Topics  . Smoking status: Never Smoker  . Smokeless tobacco: Never Used  . Alcohol use No     Comment: "maybe one on new years"  . Drug use: No  . Sexual activity: Not on file   Other Topics Concern  . Not on file   Social History Narrative   Regular exercise:  Yes    Works for city of [place]   Highest level of education:  College, paralegal    Family Status  Relation Status  . Mother Deceased   dementia, CHF, diabetes  . Father Deceased   heart attack  . Sister Alive   melanoma, depression  . Brother Alive   healthy  . Other   . Sister   . Brother    Family History  Problem Relation Age of Onset  . Stroke Mother   . Diabetes Mother   . Heart attack Father     Deceased, 56  . Cancer Other     Ovarian, Uterine  . Heart disease Other   . Melanoma Sister   . Healthy Brother       ROS:  Full 14 point review of systems complete and found to be negative unless listed above.  Physical Exam: Blood pressure (!) 170/99, pulse 79, temperature 97.8 F (36.6 C), temperature source Oral, resp. rate 20, height 5' 5" (1.651 m), weight 207 lb 6.4 oz (94.1 kg), SpO2 98 %.  General: Well developed, well nourished, female in no acute distress Head: Eyes PERRLA, No xanthomas. Normocephalic and atraumatic, oropharynx without edema or exudate.  Lungs: Resp regular and unlabored, CTA. Heart: RRR no s3, s4, or murmurs..   Neck: No carotid bruits. No lymphadenopathy.  No  JVD. Abdomen: Bowel sounds present, abdomen soft and non-tender without masses or hernias noted. Msk:  No spine or cva tenderness. No weakness, no joint deformities or effusions. Extremities: No clubbing, cyanosis or edema. DP/PT/Radials 2+ and equal bilaterally. Neuro: Alert and oriented X 3. No focal deficits noted. Psych:  Good affect, responds appropriately Skin: No rashes or lesions noted.  Labs:   Lab Results  Component Value Date   WBC 7.9 11/21/2016   HGB 12.6 11/21/2016   HCT 38.6 11/21/2016   MCV 89.6 11/21/2016   PLT 252 11/21/2016   No results for input(s): INR in the last 72 hours.  Recent Labs Lab 11/21/16 1604  NA   139  K 4.3  CL 106  CO2 24  BUN 14  CREATININE 1.03*  CALCIUM 9.3  GLUCOSE 93   No results found for: MG  Recent Labs  11/22/16 0117  TROPONINI <0.03    Recent Labs  11/21/16 1611 11/21/16 2252  TROPIPOC 0.00 0.00   No results found for: PROBNP Lab Results  Component Value Date   CHOL 188 11/22/2016   HDL 28 (L) 11/22/2016   LDLCALC 112 (H) 11/22/2016   TRIG 239 (H) 11/22/2016   Lab Results  Component Value Date   DDIMER 0.38 11/21/2016   Lipase  Date/Time Value Ref Range Status  10/27/2014 03:52 PM 22 11 - 59 U/L Final   TSH  Date/Time Value Ref Range Status  04/17/2016 12:46 PM 0.93 0.35 - 4.50 uIU/mL Final   Vitamin B-12  Date/Time Value Ref Range Status  06/05/2014 12:19 PM 425 211 - 911 pg/mL Final     Radiology:  Dg Chest 2 View  Result Date: 11/21/2016 CLINICAL DATA:  Shortness of breath and chest pain EXAM: CHEST  2 VIEW COMPARISON:  September 05, 2014 FINDINGS: There is no edema or consolidation. Heart size and pulmonary vascularity are normal. No adenopathy. There is degenerative change in thoracic spine. No pneumothorax. IMPRESSION: No edema or consolidation. Electronically Signed   By: William  Woodruff III M.D.   On: 11/21/2016 16:20    ASSESSMENT AND PLAN:     1. Exertional dyspnea and shortness of  breath - Her symptoms has recently worsened in past 4 days. Worse episode yesterday while sitting on a desk. Concerning for accelerating angina. Troponin negative so far. EKG without acute changes. X-ray clear. - Her cardiac risk factors includes family history of CAD, hyperlipidemia and prior history of hypertension. - Keep nothing by mouth. We will discuss with M.D. regarding stress test versus cath. CP free currently on Iv nitro.   2. Prior hx of HTN - Blood presser trending up. Last reading 170/99. Will start her on BB.   3.  HLD - 11/22/2016: Cholesterol 188; HDL 28; LDL Cholesterol 112; Triglycerides 239; VLDL 48  - History of myalgia on simvastatin. She is agree to try low-dose Lipitor.  Signed: Bhagat,Bhavinkumar, PA 11/22/2016, 8:27 AM Pager [phone]  Co-Sign MD  Pt seen and exmained  I agree withfiidings as noted above by B Bhagat. Pt is a 61 yo with no prior history of CAD  Hx of HL, CVA, fibromyalgia  She is s/p gastric banding   Presents with progressive exertional dyspnea, fatigue and now chest tightness Concerning   On exam:  Neck:  JVP normal  LUngs are clear  She moves air wlell and there are no wheezes.  Cardiac exam RRR  NoS3  No S4  Ext without edema   Pt is not anemic  D Dimer is normal   Ovreall picture concerning for possible progressive angina. I have discussed with pt  WOuld recomm L and possilbe R heart cath inf unrevaling L to evaluate Risks/benefits described  Pt understands and agrees to proceed  Keep NPO for possible procedure being done later today.  Chidi Shirer    

## 2016-11-22 NOTE — Consult Note (Signed)
CARDIOLOGY CONSULT NOTE   Patient ID: Traci [name] MRN: [id number] DOB/AGE: 62-Jan-1957 62 y.o.  Admit date: 11/21/2016  Primary Physician   Crawford Givens, MD Primary Cardiologist   New Reason for Consultation  CP Requesting Physician  Dr. Benjamine Mola  HPI: Traci [name] is a 61 y.o. female with a history of CVA, HLD, hypothyroidism, fibromyalgia and gastric banding presents for chest pain.   Prior hx of HTN however not on any regiment for years. Hx of exertional dyspnea x 2-3 months. Recently worsen and noted chest tightness/pressure. Both resolved with Rest. Acutely worsened since Friday 3/16 when she had a migraine episode. She did not got to work on Monday due to poor feeling and weak. Had few episode of vomiting. Yesterday while at work she continue to feel poorly. She had a episode of severe shortness of breath and chest pressure radiating all across her chest. No radiation to shoulder or back. This was her worst episode. She called her PCP and advised to go to the emergency room for further evaluation.   Her symptoms has worsened with climbing stairs. She goes to [employer] 3 times/week and does stretching exercises. She has not had recent shortness of breath and chest tightness with this activity as well.  Non smoker.  No recent travel or surgery. Father had a massive heart attack at age 63. Mother had CHF.  EKG showed NSR @ rate of 80 bpm. POC troponin x 2 negative. Troponin I x 2 negative. Scr 1.03. Normal electrolytes and d-dimer. CXR normal.  11/22/2016: Cholesterol 188; HDL 28; LDL Cholesterol 112; Triglycerides 239; VLDL 48   Blood pressure of 146/86 on presentation. Now trending up. Last reading 170/99.  Past Medical History:  Diagnosis Date  . Abnormal alkaline phosphatase test    with neg eval prev  . Arthritis   . Depression    Previously responded to Wellbutrin  . Headache(784.0)   . Hyperlipidemia   . Hypothyroidism   . Migraines   . Stroke (HCC)  12/2000  . Thyroid disease    Hypothyroidism     Past Surgical History:  Procedure Laterality Date  . CHOLECYSTECTOMY     ? 20 years ago  . LAPAROSCOPIC GASTRIC BANDING  2006  . TOTAL KNEE ARTHROPLASTY Right 11/26/2012   Procedure: TOTAL KNEE ARTHROPLASTY- right ;  Surgeon: Velna Ochs, MD;  Location: [facility] OR;  Service: Orthopedics;  Laterality: Right;    Allergies  Allergen Reactions  . Lexapro [Escitalopram Oxalate] Other (See Comments)    Fatigue, sedation  . Meperidine Hcl Other (See Comments)    Cardiac arrest and syncope  . Simvastatin Other (See Comments)    REACTION: muscle aches  . Codeine Itching  . Tramadol Itching    I have reviewed the patient's current medications . aspirin  324 mg Oral Daily  . buPROPion  150 mg Oral BID  . enoxaparin (LOVENOX) injection  40 mg Subcutaneous Daily  . levothyroxine  150 mcg Oral QAC breakfast  . multivitamin with minerals  1 tablet Oral Daily  . pantoprazole  40 mg Oral Daily  . cyanocobalamin  100 mcg Oral Daily   . sodium chloride 75 mL/hr at 11/22/16 0347  . nitroGLYCERIN 5 mcg/min (11/22/16 0348)   acetaminophen, albuterol, ALPRAZolam, aspirin-acetaminophen-caffeine, HYDROmorphone (DILAUDID) injection, nitroGLYCERIN, ondansetron (ZOFRAN) IV, zolpidem  Prior to Admission medications   Medication Sig Start Date End Date Taking? Authorizing Provider  albuterol (PROVENTIL HFA;VENTOLIN HFA) 108 (90 Base) MCG/ACT inhaler Inhale 2 puffs  into the lungs every 6 (six) hours as needed for wheezing or shortness of breath. 01/24/16  Yes Joaquim Nam, MD  buPROPion [facility] SR) 150 MG 12 hr tablet TAKE ONE TABLET BY MOUTH TWICE DAILY. 07/21/16  Yes Joaquim Nam, MD  cyanocobalamin 100 MCG tablet Take 100 mcg by mouth daily.   Yes Historical Provider, MD  levothyroxine (SYNTHROID, LEVOTHROID) 150 MCG tablet TAKE ONE TABLET BY MOUTH DAILY EXCEPT FOR ONE-HALF TABLET ON SUNDAY AND WEDNESDAYS. TOTAL OF 6 TABLETS PER 7  DAYS Patient taking differently: TAKE ONE TABLET BY MOUTH DAILY. 08/21/16  Yes Joaquim Nam, MD  Multiple Vitamin (MULTIVITAMIN) tablet Take 1 tablet by mouth daily.    Yes Historical Provider, MD  omeprazole (PRILOSEC) 20 MG capsule TAKE ONE CAPSULE BY MOUTH ONCE DAILY 05/01/16  Yes Joaquim Nam, MD     Social History   Social History  . Marital status: Single    Spouse name: N/A  . Number of children: 0  . Years of education: N/A   Occupational History  . Paralegal Unemployed   Social History Main Topics  . Smoking status: Never Smoker  . Smokeless tobacco: Never Used  . Alcohol use No     Comment: "maybe one on new years"  . Drug use: No  . Sexual activity: Not on file   Other Topics Concern  . Not on file   Social History Narrative   Regular exercise:  Yes    Works for city of [employer] level of education:  Automotive engineer, IT consultant    Family Status  Relation Status  . Mother Deceased   dementia, CHF, diabetes  . Father Deceased   heart attack  . Sister Alive   melanoma, depression  . Brother Alive   healthy  . Other   . Sister   . Brother    Family History  Problem Relation Age of Onset  . Stroke Mother   . Diabetes Mother   . Heart attack Father     Deceased, 29  . Cancer Other     Ovarian, Uterine  . Heart disease Other   . Melanoma Sister   . Healthy Brother       ROS:  Full 14 point review of systems complete and found to be negative unless listed above.  Physical Exam: Blood pressure (!) 170/99, pulse 79, temperature 97.8 F (36.6 C), temperature source Oral, resp. rate 20, height 5\' 5"  (1.651 m), weight 207 lb 6.4 oz (94.1 kg), SpO2 98 %.  General: Well developed, well nourished, female in no acute distress Head: Eyes PERRLA, No xanthomas. Normocephalic and atraumatic, oropharynx without edema or exudate.  Lungs: Resp regular and unlabored, CTA. Heart: RRR no s3, s4, or murmurs..   Neck: No carotid bruits. No lymphadenopathy.  No  JVD. Abdomen: Bowel sounds present, abdomen soft and non-tender without masses or hernias noted. Msk:  No spine or cva tenderness. No weakness, no joint deformities or effusions. Extremities: No clubbing, cyanosis or edema. DP/PT/Radials 2+ and equal bilaterally. Neuro: Alert and oriented X 3. No focal deficits noted. Psych:  Good affect, responds appropriately Skin: No rashes or lesions noted.  Labs:   Lab Results  Component Value Date   WBC 7.9 11/21/2016   HGB 12.6 11/21/2016   HCT 38.6 11/21/2016   MCV 89.6 11/21/2016   PLT 252 11/21/2016   No results for input(s): INR in the last 72 hours.  Recent Labs Lab 11/21/16 1604  NA  139  K 4.3  CL 106  CO2 24  BUN 14  CREATININE 1.03*  CALCIUM 9.3  GLUCOSE 93   No results found for: MG  Recent Labs  11/22/16 0117  TROPONINI <0.03    Recent Labs  11/21/16 1611 11/21/16 2252  TROPIPOC 0.00 0.00   No results found for: PROBNP Lab Results  Component Value Date   CHOL 188 11/22/2016   HDL 28 (L) 11/22/2016   LDLCALC 112 (H) 11/22/2016   TRIG 239 (H) 11/22/2016   Lab Results  Component Value Date   DDIMER 0.38 11/21/2016   Lipase  Date/Time Value Ref Range Status  10/27/2014 03:52 PM 22 11 - 59 U/L Final   TSH  Date/Time Value Ref Range Status  04/17/2016 12:46 PM 0.93 0.35 - 4.50 uIU/mL Final   Vitamin B-12  Date/Time Value Ref Range Status  06/05/2014 12:19 PM 425 211 - 911 pg/mL Final     Radiology:  Dg Chest 2 View  Result Date: 11/21/2016 CLINICAL DATA:  Shortness of breath and chest pain EXAM: CHEST  2 VIEW COMPARISON:  September 05, 2014 FINDINGS: There is no edema or consolidation. Heart size and pulmonary vascularity are normal. No adenopathy. There is degenerative change in thoracic spine. No pneumothorax. IMPRESSION: No edema or consolidation. Electronically Signed   By: Bretta BangWilliam  Woodruff III M.D.   On: 11/21/2016 16:20    ASSESSMENT AND PLAN:     1. Exertional dyspnea and shortness of  breath - Her symptoms has recently worsened in past 4 days. Worse episode yesterday while sitting on a desk. Concerning for accelerating angina. Troponin negative so far. EKG without acute changes. X-ray clear. - Her cardiac risk factors includes family history of CAD, hyperlipidemia and prior history of hypertension. - Keep nothing by mouth. We will discuss with M.D. regarding stress test versus cath. CP free currently on Iv nitro.   2. Prior hx of HTN - Blood presser trending up. Last reading 170/99. Will start her on BB.   3.  HLD - 11/22/2016: Cholesterol 188; HDL 28; LDL Cholesterol 112; Triglycerides 239; VLDL 48  - History of myalgia on simvastatin. She is agree to try low-dose Lipitor.  SignedManson Passey: Bhagat,Bhavinkumar, PA 11/22/2016, 8:27 AM Pager [phone]  Co-Sign MD  Pt seen and exmained  I agree withfiidings as noted above by B Bhagat. Pt is a 61 yo with no prior history of CAD  Hx of HL, CVA, fibromyalgia  She is s/p gastric banding   Presents with progressive exertional dyspnea, fatigue and now chest tightness Concerning   On exam:  Neck:  JVP normal  LUngs are clear  She moves air wlell and there are no wheezes.  Cardiac exam RRR  NoS3  No S4  Ext without edema   Pt is not anemic  D Dimer is normal   Ovreall picture concerning for possible progressive angina. I have discussed with pt  WOuld recomm L and possilbe R heart cath inf unrevaling L to evaluate Risks/benefits described  Pt understands and agrees to proceed  Keep NPO for possible procedure being done later today.  Dietrich PatesPaula Hser Belanger

## 2016-11-22 NOTE — Progress Notes (Signed)
  Echocardiogram 2D Echocardiogram has been performed.  Arvil ChacoFoster, Rion Schnitzer 11/22/2016, 1:30 PM

## 2016-11-22 NOTE — ED Notes (Signed)
Spoke with Dr Clyde LundborgNiu about continuing chest pain. Pt changed to Stepdown bed.

## 2016-11-22 NOTE — Progress Notes (Signed)
Cath results noted   LVEDP elevated I have reviewed echo LVEF and RVEF normal  Chamber sizes normal D Dimer normal  I would give lasix x 1   Follow response   Data does not support PE

## 2016-11-22 NOTE — Interval H&P Note (Signed)
Cath Lab Visit (complete for each Cath Lab visit)  Clinical Evaluation Leading to the Procedure:   ACS: Yes.   unstable angina  Non-ACS:  n/a   History and Physical Interval Note:  11/22/2016 4:46 PM  Traci [name]  has presented today for surgery, with the diagnosis of unstable angina  The various methods of treatment have been discussed with the patient and family. After consideration of risks, benefits and other options for treatment, the patient has consented to  Procedure(s): Left Heart Cath and Coronary Angiography (N/A) as a surgical intervention .  The patient's history has been reviewed, patient examined, no change in status, stable for surgery.  I have reviewed the patient's chart and labs.  Questions were answered to the patient's satisfaction.     Lorine BearsMuhammad Arida

## 2016-11-22 NOTE — Progress Notes (Signed)
Patient admitted after midnight, please see H&P.  Patient chest pain free on nitro gtt but c/o headache.  Cath today vs stress test.  Traci CanaryJessica Tameshia Bonneville DO

## 2016-11-23 ENCOUNTER — Encounter (HOSPITAL_COMMUNITY): Payer: Self-pay | Admitting: Cardiovascular Disease

## 2016-11-23 ENCOUNTER — Observation Stay (HOSPITAL_COMMUNITY): Payer: 59

## 2016-11-23 DIAGNOSIS — Z833 Family history of diabetes mellitus: Secondary | ICD-10-CM | POA: Diagnosis not present

## 2016-11-23 DIAGNOSIS — Z888 Allergy status to other drugs, medicaments and biological substances status: Secondary | ICD-10-CM | POA: Diagnosis not present

## 2016-11-23 DIAGNOSIS — R079 Chest pain, unspecified: Secondary | ICD-10-CM | POA: Diagnosis not present

## 2016-11-23 DIAGNOSIS — Z885 Allergy status to narcotic agent status: Secondary | ICD-10-CM | POA: Diagnosis not present

## 2016-11-23 DIAGNOSIS — Z8249 Family history of ischemic heart disease and other diseases of the circulatory system: Secondary | ICD-10-CM | POA: Diagnosis not present

## 2016-11-23 DIAGNOSIS — R112 Nausea with vomiting, unspecified: Secondary | ICD-10-CM

## 2016-11-23 DIAGNOSIS — F329 Major depressive disorder, single episode, unspecified: Secondary | ICD-10-CM | POA: Diagnosis present

## 2016-11-23 DIAGNOSIS — E039 Hypothyroidism, unspecified: Secondary | ICD-10-CM | POA: Diagnosis present

## 2016-11-23 DIAGNOSIS — R51 Headache: Secondary | ICD-10-CM | POA: Diagnosis not present

## 2016-11-23 DIAGNOSIS — G43909 Migraine, unspecified, not intractable, without status migrainosus: Secondary | ICD-10-CM | POA: Diagnosis not present

## 2016-11-23 DIAGNOSIS — I10 Essential (primary) hypertension: Secondary | ICD-10-CM | POA: Diagnosis not present

## 2016-11-23 DIAGNOSIS — Z8673 Personal history of transient ischemic attack (TIA), and cerebral infarction without residual deficits: Secondary | ICD-10-CM | POA: Diagnosis not present

## 2016-11-23 DIAGNOSIS — Z9049 Acquired absence of other specified parts of digestive tract: Secondary | ICD-10-CM | POA: Diagnosis not present

## 2016-11-23 DIAGNOSIS — M797 Fibromyalgia: Secondary | ICD-10-CM | POA: Diagnosis present

## 2016-11-23 DIAGNOSIS — I200 Unstable angina: Secondary | ICD-10-CM | POA: Diagnosis not present

## 2016-11-23 DIAGNOSIS — Z96651 Presence of right artificial knee joint: Secondary | ICD-10-CM | POA: Diagnosis present

## 2016-11-23 DIAGNOSIS — Z808 Family history of malignant neoplasm of other organs or systems: Secondary | ICD-10-CM | POA: Diagnosis not present

## 2016-11-23 DIAGNOSIS — Z823 Family history of stroke: Secondary | ICD-10-CM | POA: Diagnosis not present

## 2016-11-23 DIAGNOSIS — K219 Gastro-esophageal reflux disease without esophagitis: Secondary | ICD-10-CM | POA: Diagnosis not present

## 2016-11-23 DIAGNOSIS — E785 Hyperlipidemia, unspecified: Secondary | ICD-10-CM | POA: Diagnosis not present

## 2016-11-23 LAB — RAPID URINE DRUG SCREEN, HOSP PERFORMED
Amphetamines: NOT DETECTED
Barbiturates: NOT DETECTED
Benzodiazepines: POSITIVE — AB
Cocaine: NOT DETECTED
Opiates: NOT DETECTED
Tetrahydrocannabinol: NOT DETECTED

## 2016-11-23 LAB — BASIC METABOLIC PANEL
Anion gap: 11 (ref 5–15)
BUN: 11 mg/dL (ref 6–20)
CO2: 24 mmol/L (ref 22–32)
Calcium: 9.3 mg/dL (ref 8.9–10.3)
Chloride: 103 mmol/L (ref 101–111)
Creatinine, Ser: 1.02 mg/dL — ABNORMAL HIGH (ref 0.44–1.00)
GFR calc Af Amer: >60 mL/min (ref >60)
GFR calc non Af Amer: 58 mL/min — ABNORMAL LOW (ref >60)
Glucose, Bld: 134 mg/dL — ABNORMAL HIGH (ref 65–99)
Potassium: 4 mmol/L (ref 3.5–5.1)
Sodium: 138 mmol/L (ref 135–145)

## 2016-11-23 LAB — HEMOGLOBIN A1C
Hgb A1c MFr Bld: 4.9 % (ref 4.8–5.6)
Mean Plasma Glucose: 94 mg/dL

## 2016-11-23 MED ORDER — SODIUM CHLORIDE 0.9 % IV SOLN
INTRAVENOUS | Status: AC
  Administered 2016-11-23: 14:00:00 via INTRAVENOUS

## 2016-11-23 MED ORDER — FUROSEMIDE 10 MG/ML IJ SOLN
60.0000 mg | Freq: Once | INTRAMUSCULAR | Status: AC
  Administered 2016-11-23: 60 mg via INTRAVENOUS
  Filled 2016-11-23: qty 6

## 2016-11-23 MED ORDER — ROSUVASTATIN CALCIUM 10 MG PO TABS
5.0000 mg | ORAL_TABLET | Freq: Every day | ORAL | Status: DC
  Administered 2016-11-23: 5 mg via ORAL
  Filled 2016-11-23: qty 1

## 2016-11-23 NOTE — Progress Notes (Signed)
Progress Note  Patient Name: Traci [name] Date of Encounter: 11/23/2016  Primary Cardiologist: New to Dr. Tenny Craw  Subjective   Had R sided chest pain this morning. Resolved spontaneously. No further dyspnea.   Inpatient Medications    Scheduled Meds: . atorvastatin  20 mg Oral q1800  . buPROPion  150 mg Oral BID  . carvedilol  3.125 mg Oral BID WC  . enoxaparin (LOVENOX) injection  40 mg Subcutaneous Daily  . levothyroxine  150 mcg Oral QAC breakfast  . multivitamin with minerals  1 tablet Oral Daily  . pantoprazole  40 mg Oral Daily  . sodium chloride flush  3 mL Intravenous Q12H  . cyanocobalamin  100 mcg Oral Daily   Continuous Infusions: . sodium chloride Stopped (11/23/16 0400)   PRN Meds: sodium chloride, acetaminophen, albuterol, ALPRAZolam, aspirin-acetaminophen-caffeine, hydrALAZINE, HYDROmorphone (DILAUDID) injection, nitroGLYCERIN, ondansetron (ZOFRAN) IV, sodium chloride flush, zolpidem   Vital Signs    Vitals:   11/22/16 2200 11/23/16 0425 11/23/16 0749 11/23/16 0847  BP: (!) 128/107 (!) 153/106 136/89 136/89  Pulse:  76 86 93  Resp: 19 20 16    Temp:  98.1 F (36.7 C) 98.3 F (36.8 C)   TempSrc:  Oral Oral   SpO2:  98% 96%   Weight:  206 lb 1.6 oz (93.5 kg)    Height:        Intake/Output Summary (Last 24 hours) at 11/23/16 0932 Last data filed at 11/23/16 0750  Gross per 24 hour  Intake          2299.25 ml  Output             3650 ml  Net         -1350.75 ml   Filed Weights   11/22/16 0227 11/23/16 0425  Weight: 207 lb 6.4 oz (94.1 kg) 206 lb 1.6 oz (93.5 kg)    Telemetry    NSR at rate of 80s - Personally Reviewed  ECG    None today  Physical Exam   GEN: No acute distress.   Neck: No JVD Cardiac: RRR, no murmurs, rubs, or gallops. R radial cath site without hematoma.  Respiratory: Clear to auscultation bilaterally. GI: Soft, nontender, non-distended  MS: No edema; No deformity. Neuro:  Nonfocal  Psych: Normal affect    Labs    Chemistry Recent Labs Lab 11/21/16 1604  NA 139  K 4.3  CL 106  CO2 24  GLUCOSE 93  BUN 14  CREATININE 1.03*  CALCIUM 9.3  GFRNONAA 57*  GFRAA >60  ANIONGAP 9     Hematology Recent Labs Lab 11/21/16 1604  WBC 7.9  RBC 4.31  HGB 12.6  HCT 38.6  MCV 89.6  MCH 29.2  MCHC 32.6  RDW 14.0  PLT 252    Cardiac Enzymes Recent Labs Lab 11/22/16 0117 11/22/16 0722 11/22/16 1200  TROPONINI <0.03 <0.03 <0.03    Recent Labs Lab 11/21/16 1611 11/21/16 2252  TROPIPOC 0.00 0.00     BNPNo results for input(s): BNP, PROBNP in the last 168 hours.   DDimer  Recent Labs Lab 11/21/16 2235  DDIMER 0.38     Radiology    Dg Chest 2 View  Result Date: 11/21/2016 CLINICAL DATA:  Shortness of breath and chest pain EXAM: CHEST  2 VIEW COMPARISON:  September 05, 2014 FINDINGS: There is no edema or consolidation. Heart size and pulmonary vascularity are normal. No adenopathy. There is degenerative change in thoracic spine. No pneumothorax. IMPRESSION: No edema  or consolidation. Electronically Signed   By: Bretta BangWilliam  Woodruff III M.D.   On: 11/21/2016 16:20    Cardiac Studies   Echo 10/25/16 Study Conclusions  - Left ventricle: The cavity size was normal. Wall thickness was   increased in a pattern of mild LVH. Systolic function was normal.   The estimated ejection fraction was in the range of 55% to 60%.   Wall motion was normal; there were no regional wall motion   abnormalities. Doppler parameters are consistent with abnormal   left ventricular relaxation (grade 1 diastolic dysfunction). - Aortic valve: There was no stenosis. - Mitral valve: There was no significant regurgitation. - Left atrium: The atrium was mildly dilated. - Right ventricle: The cavity size was normal. Systolic function   was normal. - Pulmonary arteries: No complete TR doppler jet so unable to   estimate PA systolic pressure. - Inferior vena cava: The vessel was normal in size. The    respirophasic diameter changes were in the normal range (>= 50%),   consistent with normal central venous pressure.  Impressions:  - Normal LV size with mild LV hypertrophy. EF 55-60%. Normal RV   size and systolic function. No significant valvular   abnormalities.  Left Heart Cath and Coronary Angiography  Conclusion     The left ventricular systolic function is normal.  LV end diastolic pressure is mildly elevated.  The left ventricular ejection fraction is 50-55% by visual estimate.   1. Minor luminal irregularities with no evidence of obstructive coronary artery disease. 2. Low normal LV systolic function with an EF of 50-55%. Mildly elevated left ventricular end-diastolic pressure (LVEDP between 16-20 mmHg).  Recommendations: Medical therapy. No culprit for unstable angina is identified.     Patient Profile     Traci [name] is a 61 y.o. female with a history of CVA, HLD, hypothyroidism, fibromyalgia and gastric banding presents for chest pain.  Assessment & Plan    1. Chest pain Troponin negative - Cath showed uminal irregularities with no evidence of obstructive coronary artery disease.  2. Exertional dyspnea -Echo showed normal LVEF and RVEF. Elevated LVEDP by cath. Given IV lasix x 1. Diuresed 1.6 L. Not volume overloaded on exam. May required PRN lasix at discharge. Will get BMET today.   3. Prior hx of HTN - Blood presser trended up yesterday. Started her on Coreg 3.125mg  BID. BP stable this morning. up titrate as needed.   3.  HLD - 11/22/2016: Cholesterol 188; HDL 28; LDL Cholesterol 112; Triglycerides 239; VLDL 48  Minimal luminal irreg  Would recomm low dose statin Crestor 5 mg    Watch for achiness    Diet   Wach carbs  Exercise    Signed, Shaver LakeBhagat,Bhavinkumar, PA  11/23/2016, 9:32 AM    Pt seen and examined  I agree with findings of B Bhagat  I have amended  Pt comfortable today  Diuresed some yesterday ON exam:  Lungs CTA  Cardiac RRR   No S3  Ext without edema  Mild bruidsing R radial artery   Cath with minimal irreg of coronary arteries  LVEDP was mildly elevated Would recomm BP control  Watch salt  Weights dailly  Lasix 40 prn if wt goes up Low dose statin with irreg on cath   Watch for achiness Diet with low carbs , calorie resitrct   Exercise  Pt can follow up with primary MD   I will be available as needed    Dietrich PatesPaula Dicie Edelen

## 2016-11-23 NOTE — Progress Notes (Signed)
PROGRESS NOTE    Traci [name]  WUJ:[id number] DOB: September 05, 1955 DOA: 11/21/2016 PCP: Crawford Givens, MD   Outpatient Specialists:    Brief Narrative:  Traci [name] is a 61 y.o. female with medical history significant of hyperlipidemia, GERD, hypothyroidism, depression, stroke, migraine headache, who presents with chest pain and headache.  Patient states that she has been having intermittent chest pain in the past 3 weeks. Since Friday, her chest pain has become worse and more frequent. Her chest pain is located in substernal area, 5 out of 10 in severity, pressure-like feeling, radiating to the right arm, it is exertional. It is associated with shortness of breath. She has some mild dry cough, but no fever or chills. Patient also reports nausea, but no vomiting, abdominal pain, diarrhea. Denies symptoms of UTI. She also has headache which she attributes to migraine headache. She states that she takes Excedrin with good relief when she has migraine headache.   Assessment & Plan:   Principal Problem:   Chest pain Active Problems:   Hypothyroidism   HLD (hyperlipidemia)   Depression   GERD (gastroesophageal reflux disease)   Migraines   SOB (shortness of breath)   Essential hypertension   Unstable angina (HCC)   Headache/intractable N/V -CT head -PRNs -x ray shows gastric band but no obstruction  ortho stasis with dizziness -gentle IVF for 12 hours  Chest pain -cath negative -outpatient cardiology follow up  Hypothyroidism: Last TSH was 0.93 on 04/17/16 -Continue home Synthroid  HTN -coreg added  DVT prophylaxis:  Lovenox   Code Status: Full Code   Family Communication:   Disposition Plan:     Consultants:  cards  Subjective: c/o nausea and dry heaving  Objective: Vitals:   11/23/16 0425 11/23/16 0749 11/23/16 0847 11/23/16 1146  BP: (!) 153/106 136/89 136/89 136/82  Pulse: 76 86 93 75  Resp: 20 16  18   Temp: 98.1 F (36.7 C) 98.3 F  (36.8 C)  97.9 F (36.6 C)  TempSrc: Oral Oral  Oral  SpO2: 98% 96%  96%  Weight: 93.5 kg (206 lb 1.6 oz)     Height:        Intake/Output Summary (Last 24 hours) at 11/23/16 1324 Last data filed at 11/23/16 1011  Gross per 24 hour  Intake          2119.25 ml  Output             4050 ml  Net         -1930.75 ml   Filed Weights   11/22/16 0227 11/23/16 0425  Weight: 94.1 kg (207 lb 6.4 oz) 93.5 kg (206 lb 1.6 oz)    Examination:  General exam: dry heaving  Respiratory system: Clear to auscultation. Respiratory effort normal. Cardiovascular system: S1 & S2 heard, RRR. No JVD, murmurs, rubs, gallops or clicks. No pedal edema. Gastrointestinal system: Abdomen is nondistended, soft and nontender. No organomegaly or masses felt. Normal bowel sounds heard. Central nervous system: Alert  Skin: No rashes, lesions or ulcers     Data Reviewed: I have personally reviewed following labs and imaging studies  CBC:  Recent Labs Lab 11/21/16 1604  WBC 7.9  HGB 12.6  HCT 38.6  MCV 89.6  PLT 252   Basic Metabolic Panel:  Recent Labs Lab 11/21/16 1604 11/23/16 0949  NA 139 138  K 4.3 4.0  CL 106 103  CO2 24 24  GLUCOSE 93 134*  BUN 14 11  CREATININE 1.03* 1.02*  CALCIUM 9.3 9.3   GFR: Estimated Creatinine Clearance: 64.6 mL/min (A) (by C-G formula based on SCr of 1.02 mg/dL (H)). Liver Function Tests: No results for input(s): AST, ALT, ALKPHOS, BILITOT, PROT, ALBUMIN in the last 168 hours. No results for input(s): LIPASE, AMYLASE in the last 168 hours. No results for input(s): AMMONIA in the last 168 hours. Coagulation Profile:  Recent Labs Lab 11/22/16 1357  INR 1.03   Cardiac Enzymes:  Recent Labs Lab 11/22/16 0117 11/22/16 0722 11/22/16 1200  TROPONINI <0.03 <0.03 <0.03   BNP (last 3 results) No results for input(s): PROBNP in the last 8760 hours. HbA1C:  Recent Labs  11/22/16 0117  HGBA1C 4.9   CBG: No results for input(s): GLUCAP in the  last 168 hours. Lipid Profile:  Recent Labs  11/22/16 0117  CHOL 188  HDL 28*  LDLCALC 112*  TRIG 239*  CHOLHDL 6.7   Thyroid Function Tests: No results for input(s): TSH, T4TOTAL, FREET4, T3FREE, THYROIDAB in the last 72 hours. Anemia Panel: No results for input(s): VITAMINB12, FOLATE, FERRITIN, TIBC, IRON, RETICCTPCT in the last 72 hours. Urine analysis:    Component Value Date/Time   COLORURINE YELLOW 10/27/2014 1922   APPEARANCEUR CLEAR 10/27/2014 1922   LABSPEC 1.029 10/27/2014 1922   PHURINE 6.0 10/27/2014 1922   GLUCOSEU NEGATIVE 10/27/2014 1922   HGBUR TRACE (A) 10/27/2014 1922   BILIRUBINUR NEGATIVE 10/27/2014 1922   BILIRUBINUR Neg 06/08/2011 0826   KETONESUR 15 (A) 10/27/2014 1922   PROTEINUR NEGATIVE 10/27/2014 1922   UROBILINOGEN 0.2 10/27/2014 1922   NITRITE NEGATIVE 10/27/2014 1922   LEUKOCYTESUR NEGATIVE 10/27/2014 1922      Recent Results (from the past 240 hour(s))  MRSA PCR Screening     Status: None   Collection Time: 11/22/16  2:28 AM  Result Value Ref Range Status   MRSA by PCR NEGATIVE NEGATIVE Final    Comment:        The GeneXpert MRSA Assay (FDA approved for NASAL specimens only), is one component of a comprehensive MRSA colonization surveillance program. It is not intended to diagnose MRSA infection nor to guide or monitor treatment for MRSA infections.       Anti-infectives    None       Radiology Studies: Dg Chest 2 View  Result Date: 11/21/2016 CLINICAL DATA:  Shortness of breath and chest pain EXAM: CHEST  2 VIEW COMPARISON:  September 05, 2014 FINDINGS: There is no edema or consolidation. Heart size and pulmonary vascularity are normal. No adenopathy. There is degenerative change in thoracic spine. No pneumothorax. IMPRESSION: No edema or consolidation. Electronically Signed   By: Bretta Bang III M.D.   On: 11/21/2016 16:20   Dg Abd Portable 1v  Result Date: 11/23/2016 CLINICAL DATA:  Nausea and vomiting EXAM:  PORTABLE ABDOMEN - 1 VIEW COMPARISON:  CT abdomen pelvis 10/28/2014 FINDINGS: Gastric banding. The band is not visualized on the study. The tubing is visualized. Normal bowel gas pattern. No bowel obstruction or ileus. No acute skeletal abnormality. Surgical clips from cholecystectomy. IMPRESSION: No acute abnormality.  Gastric banding. Electronically Signed   By: Marlan Palau M.D.   On: 11/23/2016 12:51        Scheduled Meds: . buPROPion  150 mg Oral BID  . carvedilol  3.125 mg Oral BID WC  . enoxaparin (LOVENOX) injection  40 mg Subcutaneous Daily  . levothyroxine  150 mcg Oral QAC breakfast  . multivitamin with minerals  1 tablet Oral Daily  . pantoprazole  40 mg Oral Daily  . rosuvastatin  5 mg Oral q1800  . sodium chloride flush  3 mL Intravenous Q12H  . cyanocobalamin  100 mcg Oral Daily   Continuous Infusions: . sodium chloride       LOS: 0 days    Time spent: 25 min    JESSICA U VANN, DO [facility] Pager [phone]  If 7PM-7AM, please contact night-coverage [email or web address] Password [email or web address] 11/23/2016, 1:24 PM

## 2016-11-24 ENCOUNTER — Other Ambulatory Visit: Payer: 59

## 2016-11-24 LAB — CBC
HCT: 37.1 % (ref 36.0–46.0)
Hemoglobin: 11.9 g/dL — ABNORMAL LOW (ref 12.0–15.0)
MCH: 28.8 pg (ref 26.0–34.0)
MCHC: 32.1 g/dL (ref 30.0–36.0)
MCV: 89.8 fL (ref 78.0–100.0)
Platelets: 225 10*3/uL (ref 150–400)
RBC: 4.13 MIL/uL (ref 3.87–5.11)
RDW: 13.9 % (ref 11.5–15.5)
WBC: 7.7 10*3/uL (ref 4.0–10.5)

## 2016-11-24 LAB — BASIC METABOLIC PANEL
Anion gap: 9 (ref 5–15)
BUN: 14 mg/dL (ref 6–20)
CO2: 26 mmol/L (ref 22–32)
Calcium: 8.6 mg/dL — ABNORMAL LOW (ref 8.9–10.3)
Chloride: 103 mmol/L (ref 101–111)
Creatinine, Ser: 1.05 mg/dL — ABNORMAL HIGH (ref 0.44–1.00)
GFR calc Af Amer: >60 mL/min (ref >60)
GFR calc non Af Amer: 56 mL/min — ABNORMAL LOW (ref >60)
Glucose, Bld: 92 mg/dL (ref 65–99)
Potassium: 3.7 mmol/L (ref 3.5–5.1)
Sodium: 138 mmol/L (ref 135–145)

## 2016-11-24 MED ORDER — CARVEDILOL 3.125 MG PO TABS: 3.1250 mg | ORAL_TABLET | Freq: Two times a day (BID) | ORAL | 0 refills | Status: DC

## 2016-11-24 MED ORDER — ASPIRIN-ACETAMINOPHEN-CAFFEINE 250-250-65 MG PO TABS: 1.0000 | ORAL_TABLET | Freq: Three times a day (TID) | ORAL | 0 refills | Status: DC | PRN

## 2016-11-24 NOTE — Progress Notes (Signed)
The patient was given her discharge instructions along with a new medication list and prescriptions to pick up. She has been educated on radial site care and [place] PA with cardiology gave a last look at site before discharge due to hematoma after cath. She okayed patient to drive self home. Patient discharging via car.   Sheppard Evensina Nhan Qualley RN

## 2016-11-24 NOTE — Discharge Summary (Signed)
Physician Discharge Summary  Traci [name] ZOX:[id number] DOB: 1955-11-10 DOA: 11/21/2016  PCP: Traci Givens, MD  Admit date: 11/21/2016 Discharge date: 11/24/2016   Recommendations for Outpatient Follow-Up:   1.    Discharge Diagnosis:   Principal Problem:   Chest pain Active Problems:   Hypothyroidism   HLD (hyperlipidemia)   Depression   GERD (gastroesophageal reflux disease)   Migraines   SOB (shortness of breath)   Essential hypertension   Unstable angina [facility])   Discharge disposition:  Home  Discharge Condition: Improved.  Diet recommendation: Low sodium, heart healthy  Wound care: None.   History of Present Illness:   Traci [name] Andersonis a 61 y.o.femalewith medical history significant of hyperlipidemia, GERD, hypothyroidism, depression, stroke, migraine headache, who presents with chest pain and headache.  Patient states that she has been having intermittent chest pain in the past 3 weeks. Since Friday, her chest pain has become worse and more frequent. Her chest pain is located in substernal area, 5 out of 10 in severity, pressure-like feeling, radiating to the right arm, it is exertional. It is associated with shortness of breath. She has some mild dry cough, but no fever or chills. Patient also reports nausea, but no vomiting, abdominal pain, diarrhea. Denies symptoms of UTI. She also has headache which she attributes to migraine headache. She states that she takes Excedrinwith good relief when she has migraine headache.   Hospital Course by Problem:   Headache/intractable N/V -CT head normal -PRNs -x ray shows gastric band but no obstruction -resolved  ortho stasis with dizziness -reslved  Chest pain -cath negative-- has hematoma at sight of cath-- card to follow, appears stable -outpatient cardiology follow up  Hypothyroidism: Last TSH was 0.93 on 04/17/16 -Continue home Synthroid  HTN -coreg added    Medical Consultants:      cards   Discharge Exam:   Vitals:   11/24/16 0432 11/24/16 0926  BP: 125/83 109/78  Pulse:  90  Resp: 18   Temp: 98.6 F (37 C)    Vitals:   11/23/16 2001 11/24/16 0100 11/24/16 0432 11/24/16 0926  BP:  112/87 125/83 109/78  Pulse:  78  90  Resp:  18 18   Temp: 98.2 F (36.8 C) 98 F (36.7 C) 98.6 F (37 C)   TempSrc: Oral Oral Oral   SpO2:   99%   Weight:   91.9 kg (202 lb 9.6 oz)   Height:        Gen:  NAD- feeling much better    The results of significant diagnostics from this hospitalization (including imaging, microbiology, ancillary and laboratory) are listed below for reference.     Procedures and Diagnostic Studies:   Dg Chest 2 View  Result Date: 11/21/2016 CLINICAL DATA:  Shortness of breath and chest pain EXAM: CHEST  2 VIEW COMPARISON:  September 05, 2014 FINDINGS: There is no edema or consolidation. Heart size and pulmonary vascularity are normal. No adenopathy. There is degenerative change in thoracic spine. No pneumothorax. IMPRESSION: No edema or consolidation. Electronically Signed   By: Bretta Bang III M.D.   On: 11/21/2016 16:20     Labs:   Basic Metabolic Panel:  Recent Labs Lab 11/21/16 1604 11/23/16 0949 11/24/16 0339  NA 139 138 138  K 4.3 4.0 3.7  CL 106 103 103  CO2 24 24 26   GLUCOSE 93 134* 92  BUN 14 11 14   CREATININE 1.03* 1.02* 1.05*  CALCIUM 9.3 9.3 8.6*   GFR  Estimated Creatinine Clearance: 62.3 mL/min (A) (by C-G formula based on SCr of 1.05 mg/dL (H)). Liver Function Tests: No results for input(s): AST, ALT, ALKPHOS, BILITOT, PROT, ALBUMIN in the last 168 hours. No results for input(s): LIPASE, AMYLASE in the last 168 hours. No results for input(s): AMMONIA in the last 168 hours. Coagulation profile  Recent Labs Lab 11/22/16 1357  INR 1.03    CBC:  Recent Labs Lab 11/21/16 1604 11/24/16 0339  WBC 7.9 7.7  HGB 12.6 11.9*  HCT 38.6 37.1  MCV 89.6 89.8  PLT 252 225   Cardiac  Enzymes:  Recent Labs Lab 11/22/16 0117 11/22/16 0722 11/22/16 1200  TROPONINI <0.03 <0.03 <0.03   BNP: Invalid input(s): POCBNP CBG: No results for input(s): GLUCAP in the last 168 hours. D-Dimer  Recent Labs  11/21/16 2235  DDIMER 0.38   Hgb A1c  Recent Labs  11/22/16 0117  HGBA1C 4.9   Lipid Profile  Recent Labs  11/22/16 0117  CHOL 188  HDL 28*  LDLCALC 112*  TRIG 239*  CHOLHDL 6.7   Thyroid function studies No results for input(s): TSH, T4TOTAL, T3FREE, THYROIDAB in the last 72 hours.  Invalid input(s): FREET3 Anemia work up No results for input(s): VITAMINB12, FOLATE, FERRITIN, TIBC, IRON, RETICCTPCT in the last 72 hours. Microbiology Recent Results (from the past 240 hour(s))  MRSA PCR Screening     Status: None   Collection Time: 11/22/16  2:28 AM  Result Value Ref Range Status   MRSA by PCR NEGATIVE NEGATIVE Final    Comment:        The GeneXpert MRSA Assay (FDA approved for NASAL specimens only), is one component of a comprehensive MRSA colonization surveillance program. It is not intended to diagnose MRSA infection nor to guide or monitor treatment for MRSA infections.      Discharge Instructions:   Discharge Instructions    Diet - low sodium heart healthy    Complete by:  As directed    Increase activity slowly    Complete by:  As directed      Allergies as of 11/24/2016      Reactions   Lexapro [escitalopram Oxalate] Other (See Comments)   Fatigue, sedation   Meperidine Hcl Other (See Comments)   Cardiac arrest and syncope   Simvastatin Other (See Comments)   REACTION: muscle aches Trying low-dose Crestor 11/23/16   Codeine Itching   Tramadol Itching      Medication List    TAKE these medications   albuterol 108 (90 Base) MCG/ACT inhaler Commonly known as:  PROVENTIL HFA;VENTOLIN HFA Inhale 2 puffs into the lungs every 6 (six) hours as needed for wheezing or shortness of breath.   aspirin-acetaminophen-caffeine  250-250-65 MG tablet Commonly known as:  EXCEDRIN MIGRAINE Take 1 tablet by mouth every 8 (eight) hours as needed for migraine.   buPROPion 150 MG 12 hr tablet Commonly known as:  WELLBUTRIN SR TAKE ONE TABLET BY MOUTH TWICE DAILY.   carvedilol 3.125 MG tablet Commonly known as:  COREG Take 1 tablet (3.125 mg total) by mouth 2 (two) times daily with a meal.   cyanocobalamin 100 MCG tablet Take 100 mcg by mouth daily.   levothyroxine 150 MCG tablet Commonly known as:  SYNTHROID, LEVOTHROID TAKE ONE TABLET BY MOUTH DAILY EXCEPT FOR ONE-HALF TABLET ON SUNDAY AND WEDNESDAYS. TOTAL OF 6 TABLETS PER 7 DAYS What changed:  See the new instructions.   multivitamin tablet Take 1 tablet by mouth daily.   omeprazole  20 MG capsule Commonly known as:  PRILOSEC TAKE ONE CAPSULE BY MOUTH ONCE DAILY      Follow-up Information    Traci Givens, MD Follow up in 1 week(s).   Specialty:  Family Medicine Contact information: [address] [place] [address] [phone]            Time coordinating discharge: 35 min  Signed:  Laveta [name] Juanetta Gosling   [facility] 11/24/2016, 12:31 PM

## 2016-11-24 NOTE — Progress Notes (Signed)
   Asked to see the patient prior to discharge to examine her right radial cath site. Small, stable hematoma is noted which has not expanded past prior drawn-out boundaries and has actually declined in size. No bruit appreciated.   Reviewed with the patient what to look for in regards to expanding size of the hematoma as well as lifting restrictions < 5 lbs for the next week.  Signed, Ellsworth LennoxBrittany M Crescencio Jozwiak, PA-C 11/24/2016, 4:05 PM

## 2016-11-28 ENCOUNTER — Encounter: Payer: Self-pay | Admitting: Family Medicine

## 2016-11-28 ENCOUNTER — Ambulatory Visit (INDEPENDENT_AMBULATORY_CARE_PROVIDER_SITE_OTHER): Payer: 59 | Admitting: Family Medicine

## 2016-11-28 VITALS — BP 140/86 | HR 74 | Temp 98.5°F | Wt 206.5 lb

## 2016-11-28 DIAGNOSIS — R079 Chest pain, unspecified: Secondary | ICD-10-CM

## 2016-11-28 MED ORDER — CARVEDILOL 3.125 MG PO TABS: 3.1250 mg | ORAL_TABLET | Freq: Two times a day (BID) | ORAL | 5 refills | Status: DC

## 2016-11-28 MED ORDER — LEVOTHYROXINE SODIUM 150 MCG PO TABS: 150.0000 ug | ORAL_TABLET | Freq: Every day | ORAL | Status: DC

## 2016-11-28 NOTE — Progress Notes (Signed)
Pre visit review using our clinic review tool, if applicable. No additional management support is needed unless otherwise documented below in the visit note. 

## 2016-11-28 NOTE — Progress Notes (Signed)
   Admit date: 11/21/2016 Discharge date: 11/24/2016    Discharge Diagnosis:   Principal Problem:   Chest pain Active Problems:   Hypothyroidism   HLD (hyperlipidemia)   Depression   GERD (gastroesophageal reflux disease)   Migraines   SOB (shortness of breath)   Essential hypertension   Unstable angina [facility](HCC)   Discharge disposition:  Home  Discharge Condition: Improved.  Diet recommendation: Low sodium, heart healthy  Wound care: None.   History of Present Illness:   Traci FentonMary Lynn Andersonis a 61 y.o.femalewith medical history significant of hyperlipidemia, GERD, hypothyroidism, depression, stroke, migraine headache, who presents with chest pain and headache.  Patient states that she has been having intermittent chest pain in the past 3 weeks. Since Friday, her chest pain has become worse and more frequent. Her chest pain is located in substernal area, 5 out of 10 in severity, pressure-like feeling, radiating to the right arm, it is exertional. It is associated with shortness of breath. She has some mild dry cough, but no fever or chills. Patient also reports nausea, but no vomiting, abdominal pain, diarrhea. Denies symptoms of UTI. She also has headache which she attributes to migraine headache. She states that she takes Excedrinwith good relief when she has migraine headache.   Hospital Course by Problem:   Headache/intractable N/V -CT head normal -PRNs -x ray shows gastric band but no obstruction -resolved  ortho stasis with dizziness -reslved  Chest pain -cath negative-- has hematoma at sight of cath-- card to follow, appears stable -outpatient cardiology follow up  Hypothyroidism: Last TSH was 0.93 on 04/17/16 -Continue home Synthroid  HTN -coreg added ================================================= Cath:  The left ventricular systolic function is normal.  LV end diastolic pressure is mildly elevated.  The left ventricular  ejection fraction is 50-55% by visual estimate.   1. Minor luminal irregularities with no evidence of obstructive coronary artery disease. 2. Low normal LV systolic function with an EF of 50-55%. Mildly elevated left ventricular end-diastolic pressure (LVEDP between 16-20 mmHg).  Recommendations: Medical therapy. No culprit for unstable angina is identified.  Echo:  - Normal LV size with mild LV hypertrophy. EF 55-60%. Normal RV   size and systolic function. No significant valvular abnormalities.  ================================================ The above was d/w pt.  Admitted with CP and s/p cath. Started on BB in meantime.  Inpatient testing d/w pt.   She still had some chest pain after discharge, to the R of the sternum.  Similar to sx that lead to ER eval.  This has resolved in the meantime, with the noted addition of the BB.   Notable hx prior to admission- She prev had trouble finishing her workout in the last few weeks.    In meantime, R wrist hematoma noted after cath.  Fading bruising.  Doesn't feel as tight as previous.    Her breathing is better now than prev but not back to baseline.  She isn't SOB now.    PMH and SH reviewed  ROS: Per HPI unless specifically indicated in ROS section   Meds, vitals, and allergies reviewed.   GEN: nad, alert and oriented HEENT: mucous membranes moist NECK: supple w/o LA CV: rrr PULM: ctab, no inc wob ABD: soft, +bs EXT: no edema SKIN: no acute rash Bruising on R radial cath site noted, but minimally ttp and radial pulse intact.

## 2016-11-28 NOTE — Patient Instructions (Addendum)
Cath:  The left ventricular systolic function is normal.  LV end diastolic pressure is mildly elevated.  The left ventricular ejection fraction is 50-55% by visual estimate.   1. Minor luminal irregularities with no evidence of obstructive coronary artery disease. 2. Low normal LV systolic function with an EF of 50-55%. Mildly elevated left ventricular end-diastolic pressure (LVEDP between 16-20 mmHg).  Traci [name] will call about your referral. Don't change your meds for now.  I'll check with cardiology in the meantime.  Take care.  Glad to see you.

## 2016-11-29 NOTE — Assessment & Plan Note (Signed)
She is clearly improved, with the addition of beta blocker noted.  I put in the referral for cards eva.  I question that given the cath, if she had small vessel disease causing the admitting sx and now is improved due to effect of beta blocker.  If so, she'll need more aggressive lipid tx with prev statin intolerance noted.  D/w pt.  I would continue meds as is for now.  She agrees.  At this point, okay for outpatient f/u.    There are no other med changes related to her other chronic issues.

## 2016-12-01 ENCOUNTER — Ambulatory Visit (INDEPENDENT_AMBULATORY_CARE_PROVIDER_SITE_OTHER): Payer: 59 | Admitting: Cardiovascular Disease

## 2016-12-01 ENCOUNTER — Encounter: Payer: Self-pay | Admitting: Cardiovascular Disease

## 2016-12-01 VITALS — BP 138/86 | HR 76 | Ht 65.0 in | Wt 206.0 lb

## 2016-12-01 DIAGNOSIS — R0789 Other chest pain: Secondary | ICD-10-CM | POA: Diagnosis not present

## 2016-12-01 DIAGNOSIS — R0602 Shortness of breath: Secondary | ICD-10-CM

## 2016-12-01 DIAGNOSIS — E7800 Pure hypercholesterolemia, unspecified: Secondary | ICD-10-CM

## 2016-12-01 DIAGNOSIS — Z5181 Encounter for therapeutic drug level monitoring: Secondary | ICD-10-CM | POA: Diagnosis not present

## 2016-12-01 DIAGNOSIS — I5032 Chronic diastolic (congestive) heart failure: Secondary | ICD-10-CM

## 2016-12-01 DIAGNOSIS — I110 Hypertensive heart disease with heart failure: Secondary | ICD-10-CM

## 2016-12-01 MED ORDER — CARVEDILOL 6.25 MG PO TABS: 6.2500 mg | ORAL_TABLET | Freq: Two times a day (BID) | ORAL | 1 refills | Status: DC

## 2016-12-01 MED ORDER — ROSUVASTATIN CALCIUM 20 MG PO TABS: 20.0000 mg | ORAL_TABLET | Freq: Every day | ORAL | 1 refills | Status: DC

## 2016-12-01 NOTE — Patient Instructions (Addendum)
Medication Instructions:  INCREASE YOUR CARVEDILOL TO 6.25 MG TWICE A DAY   START CRESTOR 20 MG DAILY  START ASPIRIN 81 MG DAILY   Labwork:  FASTING LIPID/CMET IN ABOUT 6 WEEKS   Testing/Procedures:  NONE  Follow-Up:  Your physician recommends that you schedule a follow-up appointment in: 2 MONTH OV  Any Other Special Instructions Will Be Listed Below (If Applicable).   Cooking With Less Salt Cooking with less salt is one way to reduce the amount of sodium you get from food. Depending on your condition and overall health, your health care provider or diet and nutrition specialist (dietitian) may recommend that you reduce your sodium intake. Most people should have less than 2,300 milligrams (mg) of sodium each day. If you have high blood pressure (hypertension), you may need to limit your sodium to 1,500 mg each day. Follow the tips below to help reduce your sodium intake. What do I need to know about cooking with less salt? Shopping   Buy sodium-free or low-sodium products. Look for the following words on food labels:  Low-sodium.  Sodium-free.  Reduced-sodium.  No salt added.  Unsalted.  Buy fresh or frozen vegetables. Avoid canned vegetables.  Avoid buying meats or protein foods that have been injected with broth or saline solution.  Avoid cured or smoked meats, such as hot dogs, bacon, salami, ham, and bologna. Reading food labels   Check the food label before buying or using packaged ingredients.  Look for products with no more than 140 mg of sodium in one serving.  Do not choose foods with salt as one of the first three ingredients on the ingredients list. If salt is one of the first three ingredients, it usually means the item is high in sodium, because ingredients are listed in order of amount in the food item. Cooking   Use herbs, seasonings without salt, and spices as substitutes for salt in foods.  Use sodium-free baking soda when baking.  Grill,  braise, or roast foods to add flavor with less salt.  Avoid adding salt to pasta, rice, or hot cereals while cooking.  Drain and rinse canned vegetables before use.  Avoid adding salt when cooking sweets and desserts.  Cook with low-sodium ingredients. What are some salt alternatives? The following are herbs, seasonings, and spices that can be used instead of salt to give taste to your food. Herbs should be fresh or dried. Do not choose packaged mixes. Next to the name of the herb, spice, or seasoning are some examples of foods you can pair it with. Herbs   Bay leaves - Soups, meat and vegetable dishes, and spaghetti sauce.  Basil - [employer], soups, pasta, and fish dishes.  Cilantro - Meat, poultry, and vegetable dishes.  Chili powder - Marinades and Mexican dishes.  Chives - Salad dressings and potato dishes.  Cumin - Mexican dishes, couscous, and meat dishes.  Dill - Fish dishes, sauces, and salads.  Fennel - Meat and vegetable dishes, breads, and cookies.  Garlic (do not use garlic salt) - [place] dishes, meat dishes, salad dressings, and sauces.  Marjoram - Soups, potato dishes, and meat dishes.  Oregano - Pizza and spaghetti sauce.  Parsley - Salads, soups, pasta, and meat dishes.  Rosemary - [place] dishes, salad dressings, soups, and red meats.  Saffron - Fish dishes, pasta, and some poultry dishes.  Sage - Stuffings and sauces.  Tarragon - Fish and [employer].  Thyme - Stuffing, meat, and fish dishes. Seasonings  Lemon juice - Fish dishes, poultry dishes, vegetables, and salads.  Vinegar - Salad dressings, vegetables, and fish dishes. Spices   Cinnamon - Sweet dishes, such as cakes, cookies, and puddings.  Cloves - Gingerbread, puddings, and marinades for meats.  Curry - Vegetable dishes, fish and poultry dishes, and stir-fry dishes.  Ginger - Vegetables dishes, fish dishes, and stir-fry dishes.  Nutmeg - Pasta, vegetables, poultry, fish  dishes, and custard. What are some low-sodium ingredients and foods?  Fresh or frozen fruits and vegetables with no sauce added.  Fresh or frozen whole meats, poultry, and fish with no sauce added.  Eggs.  Noodles, pasta, quinoa, rice.  Shredded or puffed wheat or puffed rice.  Regular or quick oats.  Milk, yogurt, hard cheeses, and low-sodium cheeses. Good cheese choices include Swiss, [employer], and [address]. Always check the label for the serving size and sodium content.  Unsalted butter or margarine.  Unsalted nuts.  Sherbet or ice cream (keep to  cup per serving).  Homemade pudding.  Sodium-free baking soda and baking powder. This is not a complete list of low-sodium ingredients and foods. Contact your dietitian for more options.  Summary  Cooking with less salt is one way to reduce the amount of sodium that you get from food.  Buy sodium-free or low-sodium products.  Check the food label before using or buying packaged ingredients.  Use herbs, seasonings without salt, and spices as substitutes for salt in foods. This information is not intended to replace advice given to you by your health care provider. Make sure you discuss any questions you have with your health care provider. Document Released: 08/21/2005 Document Revised: 08/29/2016 Document Reviewed: 08/29/2016 Elsevier Interactive Patient Education  2017 [employer]  LIMIT SALT INTAKE TO 2,000 MG OR LESS A DAY   Fluid Restriction Some health conditions may require you to restrict your fluid intake. This means that you need to limit the amount of fluid you drink each day. When you have a fluid restriction, you must carefully measure and keep track of the amount of fluid you drink. Your health care provider will identify the specific amount of fluid you are allowed each day. This amount may depend on several things, such as:  The amount of urine you produce in a day.  How much fluid you are keeping  (retaining) in your body.  Your blood pressure. What is my plan? Your health care provider recommends that you limit your fluid intake to ____2 LITERS______ per day. What counts toward my fluid intake? Your fluid intake includes all liquids that you drink, as well as any foods that become liquid at room temperature. The following are examples of some fluids that you will have to restrict:  Tea, coffee, soda, lemonade, milk, water, juice, sport drinks, and nutritional supplement beverages.  Alcoholic beverages.  Cream.  Gravy.  Ice cubes.  Soup and broth. The following are examples of foods that become liquid at room temperature. These foods will also count toward your fluid intake.  Ice cream and ice milk.  Frozen yogurt and sherbet.  Frozen ice pops.  Flavored gelatin. How do I keep track of my fluid intake? Each morning, fill a jug with the amount of water that equals the amount of fluid you are allowed for the day. You can use this water as a guideline for fluid allowance. Each time you take in any form of fluid, including ice cubes and foods that become liquid at room temperature, pour an equal  amount of water out of the container. This helps you to see how much fluid you are taking in. It also helps you to see how much of your fluid intake is left for the rest of the day. The following conversions may also be helpful in measuring your fluid intake:  1 cup equals 8 oz (240 mL).   cup equals 6 oz (180 mL).  ? cup equals 5? oz (160 mL).   cup equals 4 oz (120 mL).  ? cup equals 2? oz (80 mL).   cup equals 2 oz (60 mL).  2 Tbsp equals 1 oz (30 mL). What home care instructions should I follow while restricting fluids?  Make sure that you stay within the recommended limit each day. Always measure and keep track of your fluids, as well as any foods that turn liquid at room temperature.  Use small cups and glasses and learn to sip fluids slowly.  Add a slice of  fresh lemon or lemon juice to water or ice. This helps to satisfy your thirst.  Freeze fruit juice or water in an ice cube tray. Use this as part of your fluid allowance. These cubes are useful for quenching your thirst. Measure the amount of liquid in each ice cube prior to freezing so you can subtract this amount from your day's allowance when you consume each frozen cube.  Try frozen fruits between meals, such as grapes or strawberries.  Swallow your pills along with meals or soft foods, such as applesauce or mashed potatoes. This helps you to save your fluid allowance for something that you enjoy.  Weigh yourself every day. Keeping track of your daily weight can help you and your health care provider to notice as soon as possible if you are retaining too much fluid in your body.  Weigh yourself every morning after you urinate but before you eat breakfast.  Wear the same amount of clothing each time you weigh yourself.  Write down your daily weight. Give this weight record to your health care provider. If your weight is going up, you may be retaining too much fluid. Every 2 cups (480 mL) of fluid retained in the body becomes an extra 1 lb (0.45 kg) of body weight.  Avoid salty foods. These foods make you thirsty and make fluid control more difficult.  Brush your teeth often or rinse your mouth with mouthwash to help your dry mouth. Lemon wedges, hard sour candies, chewing gum, or breath spray may also help to moisten your mouth.  Keep the temperature in your home at a cooler level. Dry air increases thirst, so keep the air in your home as humid as possible.  Avoid being out in the hot sun, which can cause you to sweat and become thirsty. What are some signs that I may be taking in too much fluid? You may be taking in too much fluid if:  Your weight increases. Contact your health care provider if your weight increases 3 lb or more in a day or if it increases 5 lb or more in a  week.  Your face, hands, legs, feet, and belly (abdomen) start to swell.  You have trouble breathing. This information is not intended to replace advice given to you by your health care provider. Make sure you discuss any questions you have with your health care provider. Document Released: 06/18/2007 Document Revised: 01/27/2016 Document Reviewed: 01/20/2014 Elsevier Interactive Patient Education  2017 [employer].

## 2016-12-01 NOTE — Progress Notes (Signed)
Cardiology Office Note   Date:  12/01/2016   ID:  Traci, [name] Oct 20, 1955, MRN [id number]  PCP:  Traci Givens, MD  Cardiologist:   Traci Si, MD   Chief Complaint  Patient presents with  . New Patient (Initial Visit)    here for chest discomfort-feels like a twinge, X1 week, occassional shortness of breath, no edema, pain or cramping in legs, lightheaded or dizziness      History of Present Illness: Traci [name] is a 61 y.o. female with hypertension, hyperlipidemia, prior stroke, fibromyalgia, gastric band, and hypothyroidism who presents for follow up.  Traci [name] was admitted to the hospital 11/21/16 with three weeks of intermittent chest pain.  Cardiac enzymes were negative.  She underwent LHC 11/22/16 and had mild luminal irregularities but no obstructive disease.  LVEF was 50-55%.  LVEDP was mildly elevated at 16-20 mmHg.  BP was poorly controlled in the hospital.  She was started on carvedilol and diuresed with IV lasix.  Her echo during that hospitalization revealed LVEF 55-60% with mild LVH and grade 1 diastolic dysfunction.  Her heart catheterization was complicated by a small R groin hematoma.  She followed up with her PCP, Traci [name], who wondered if she may have microvascular angina given her improvement with a beta blocker.  Since Leaving the hospital Traci [name] has been feeling well. She continues to have some intermittent right sided chest pressure. He comes and goes randomly. It is not associated with exertion. She does have some shortness of breath with exertion. Prior to her hospitalization she had orthopnea but has not experienced this since discharge. She denies lower extremity edema.   Past Medical History:  Diagnosis Date  . Abnormal alkaline phosphatase test    with neg eval prev  . Arthritis   . Depression    Previously responded to Wellbutrin  . Headache(784.0)   . Hyperlipidemia   . Hypothyroidism   . Migraines   . Stroke (HCC)  12/2000  . Thyroid disease    Hypothyroidism    Past Surgical History:  Procedure Laterality Date  . CHOLECYSTECTOMY     ? 20 years ago  . LAPAROSCOPIC GASTRIC BANDING  2006  . LEFT HEART CATH AND CORONARY ANGIOGRAPHY N/A 11/22/2016   Procedure: Left Heart Cath and Coronary Angiography;  Surgeon: Iran Ouch, MD;  Location: [facility];  Service: Cardiovascular;  Laterality: N/A;  . TOTAL KNEE ARTHROPLASTY Right 11/26/2012   Procedure: TOTAL KNEE ARTHROPLASTY- right ;  Surgeon: Velna Ochs, MD;  Location: [facility] OR;  Service: Orthopedics;  Laterality: Right;     Current Outpatient Prescriptions  Medication Sig Dispense Refill  . albuterol (PROVENTIL HFA;VENTOLIN HFA) 108 (90 Base) MCG/ACT inhaler Inhale 2 puffs into the lungs every 6 (six) hours as needed for wheezing or shortness of breath. 1 Inhaler 0  . aspirin EC 81 MG tablet Take 81 mg by mouth daily.    [name] aspirin-acetaminophen-caffeine (EXCEDRIN MIGRAINE) 250-250-65 MG tablet Take 1 tablet by mouth every 8 (eight) hours as needed for migraine. 30 tablet 0  . buPROPion (WELLBUTRIN SR) 150 MG 12 hr tablet TAKE ONE TABLET BY MOUTH TWICE DAILY. 180 tablet 1  . carvedilol (COREG) 6.25 MG tablet Take 1 tablet (6.25 mg total) by mouth 2 (two) times daily with a meal. 180 tablet 1  . cyanocobalamin 100 MCG tablet Take 100 mcg by mouth daily.    [name] levothyroxine (SYNTHROID, LEVOTHROID) 150 MCG tablet Take 1 tablet (150  mcg total) by mouth daily.    . Multiple Vitamin (MULTIVITAMIN) tablet Take 1 tablet by mouth daily.     [name] omeprazole (PRILOSEC) 20 MG capsule TAKE ONE CAPSULE BY MOUTH ONCE DAILY 30 capsule 5  . rosuvastatin (CRESTOR) 20 MG tablet Take 1 tablet (20 mg total) by mouth daily. 90 tablet 1   No current facility-administered medications for this visit.     Allergies:   Lexapro [escitalopram oxalate]; Meperidine hcl; Simvastatin; Codeine; and Tramadol    Social History:  The patient  reports that she has never  smoked. She has never used smokeless tobacco. She reports that she does not drink alcohol or use drugs.   Family History:  The patient's family history includes Cancer in her other; Diabetes in her mother; Healthy in her brother; Heart attack in her father; Heart disease in her other; Melanoma in her sister; Stroke in her mother.    ROS:  Please see the history of present illness.   Otherwise, review of systems are positive for none.   All other systems are reviewed and negative.    PHYSICAL EXAM: VS:  BP 138/86   Pulse 76   Ht  (1.651 m)   Wt 93.4 kg (206 lb)   BMI 34.28 kg/m  , BMI Body mass index is 34.28 kg/m. GENERAL:  Well appearing HEENT:  Pupils equal round and reactive, fundi not visualized, oral mucosa unremarkable NECK:  No jugular venous distention, waveform within normal limits, carotid upstroke brisk and symmetric, no bruits, no thyromegaly LYMPHATICS:  No cervical adenopathy LUNGS:  Clear to auscultation bilaterally HEART:  RRR.  PMI not displaced or sustained,S1 and S2 within normal limits, no S3, no S4, no clicks, no rubs, no murmurs ABD:  Flat, positive bowel sounds normal in frequency in pitch, no bruits, no rebound, no guarding, no midline pulsatile mass, no hepatomegaly, no splenomegaly EXT:  2 plus pulses throughout, no edema, no cyanosis no clubbing SKIN:  No rashes no nodules NEURO:  Cranial nerves II through XII grossly intact, motor grossly intact throughout PSYCH:  Cognitively intact, oriented to person place and time    EKG:  EKG is not ordered today. The ekg ordered 11/21/16 demonstrates sinus rhythm. Rate 91 bpm.  LHC 11/22/16  The left ventricular systolic function is normal.  LV end diastolic pressure is mildly elevated.  The left ventricular ejection fraction is 50-55% by visual estimate.  1. Minor luminal irregularities with no evidence of obstructive coronary artery disease. 2. Low normal LV systolic function with an EF of 50-55%.  Mildly elevated left ventricular end-diastolic pressure (LVEDP between 16-20 mmHg).  Echo 11/22/16: Study Conclusions  - Left ventricle: The cavity size was normal. Wall thickness was   increased in a pattern of mild LVH. Systolic function was normal.   The estimated ejection fraction was in the range of 55% to 60%.   Wall motion was normal; there were no regional wall motion   abnormalities. Doppler parameters are consistent with abnormal   left ventricular relaxation (grade 1 diastolic dysfunction). - Aortic valve: There was no stenosis. - Mitral valve: There was no significant regurgitation. - Left atrium: The atrium was mildly dilated. - Right ventricle: The cavity size was normal. Systolic function   was normal. - Pulmonary arteries: No complete TR doppler jet so unable to   estimate PA systolic pressure. - Inferior vena cava: The vessel was normal in size. The   respirophasic diameter changes were in the normal range (>=  50%),   consistent with normal central venous pressure.  Impressions:  - Normal LV size with mild LV hypertrophy. EF 55-60%. Normal RV   size and systolic function. No significant valvular   abnormalities.   Recent Labs: 04/17/2016: TSH 0.93 11/24/2016: BUN 14; Creatinine, Ser 1.05; Hemoglobin 11.9; Platelets 225; Potassium 3.7; Sodium 138    Lipid Panel    Component Value Date/Time   CHOL 188 11/22/2016 0117   TRIG 239 (H) 11/22/2016 0117   HDL 28 (L) 11/22/2016 0117   CHOLHDL 6.7 11/22/2016 0117   VLDL 48 (H) 11/22/2016 0117   LDLCALC 112 (H) 11/22/2016 0117   LDLDIRECT 103.6 07/17/2011 0920      Wt Readings from Last 3 Encounters:  12/01/16 93.4 kg (206 lb)  11/28/16 93.7 kg (206 lb 8 oz)  11/24/16 91.9 kg (202 lb 9.6 oz)      ASSESSMENT AND PLAN:  # Hypertensive heart disease:  Blood pressure remains above goal. We will increase carvedilol to 6.25 mg twice daily.   # Chest pain: # Chronic diastolic dysfunction: Symptoms are  atypical. She has no obstructive coronary disease on cardiac catheterization. I suspect that her chest and shortness of breath prior to admission was due to volume overload. We discussed the importance of reducing her salt intake to 2 g and fluid intake to 2 L daily. She expressed understanding.   # Hyperlipidemia:  ASCVD 10 year risk is 8.8%.  She also reports a history of stroke, though MRI of the brain and head CT have not shown any evidence of prior stroke. We will start rosuvastatin 20 mg daily. We will also start aspirin 81 mg daily. She will have lipids and CMP checked in 6 weeks.   Current medicines are reviewed at length with the patient today.  The patient does not have concerns regarding medicines.  The following changes have been made:  Increase carvedilol. Start aspirin and rosuvastatin.  Labs/ tests ordered today include:   Orders Placed This Encounter  Procedures  . Lipid panel  . Comprehensive metabolic panel     Disposition:   FU with Oluwatomisin Hustead C. Duke Salvia, MD, [facility] in 2 months    This note was written with the assistance of speech recognition software.  Please excuse any transcriptional errors.  Signed, Oluwatamilore Starnes C. Duke Salvia, MD, [facility]  12/01/2016 9:26 AM    [facility]

## 2017-01-10 ENCOUNTER — Ambulatory Visit (INDEPENDENT_AMBULATORY_CARE_PROVIDER_SITE_OTHER): Payer: 59 | Admitting: Psychology

## 2017-01-10 DIAGNOSIS — F4323 Adjustment disorder with mixed anxiety and depressed mood: Secondary | ICD-10-CM | POA: Diagnosis not present

## 2017-01-25 ENCOUNTER — Other Ambulatory Visit (INDEPENDENT_AMBULATORY_CARE_PROVIDER_SITE_OTHER): Payer: 59

## 2017-01-25 DIAGNOSIS — Z5181 Encounter for therapeutic drug level monitoring: Secondary | ICD-10-CM | POA: Diagnosis not present

## 2017-01-25 DIAGNOSIS — E7800 Pure hypercholesterolemia, unspecified: Secondary | ICD-10-CM | POA: Diagnosis not present

## 2017-01-25 LAB — COMPREHENSIVE METABOLIC PANEL
ALT: 13 U/L (ref 0–35)
AST: 15 U/L (ref 0–37)
Albumin: 4 g/dL (ref 3.5–5.2)
Alkaline Phosphatase: 133 U/L — ABNORMAL HIGH (ref 39–117)
BUN: 12 mg/dL (ref 6–23)
CO2: 28 mEq/L (ref 19–32)
Calcium: 8.9 mg/dL (ref 8.4–10.5)
Chloride: 107 mEq/L (ref 96–112)
Creatinine, Ser: 0.89 mg/dL (ref 0.40–1.20)
GFR: 68.23 mL/min (ref >60.00)
Glucose, Bld: 96 mg/dL (ref 70–99)
Potassium: 4.4 mEq/L (ref 3.5–5.1)
Sodium: 140 mEq/L (ref 135–145)
Total Bilirubin: 0.3 mg/dL (ref 0.2–1.2)
Total Protein: 6.5 g/dL (ref 6.0–8.3)

## 2017-01-25 LAB — LIPID PANEL
Cholesterol: 109 mg/dL (ref 0–200)
HDL: 31.4 mg/dL — ABNORMAL LOW (ref >39.00)
LDL Cholesterol: 44 mg/dL (ref 0–99)
NonHDL: 78.02
Total CHOL/HDL Ratio: 3
Triglycerides: 170 mg/dL — ABNORMAL HIGH (ref 0.0–149.0)
VLDL: 34 mg/dL (ref 0.0–40.0)

## 2017-01-27 ENCOUNTER — Other Ambulatory Visit: Payer: Self-pay | Admitting: Family Medicine

## 2017-01-29 NOTE — Progress Notes (Signed)
Cardiology Office Note   Date:  01/31/2017   ID:  Traci BankerMary Lynn [name], DOB October 28, 1955, MRN [id number]  PCP:  Joaquim Namuncan, Graham S, MD  Cardiologist:   Chilton Siiffany Klemme, MD   Chief Complaint  Patient presents with  . Follow-up      History of Present Illness: Traci [name] is a 61 y.o. female with hypertension, hyperlipidemia, prior stroke, fibromyalgia, gastric band, and hypothyroidism who presents for follow up.  Ms. Traci [name] was admitted to the hospital 11/21/16 with three weeks of intermittent chest pain.  Cardiac enzymes were negative.  She underwent LHC 11/22/16 and had mild luminal irregularities but no obstructive disease.  LVEF was 50-55%.  LVEDP was mildly elevated at 16-20 mmHg.  BP was poorly controlled in the hospital.  She was started on carvedilol and diuresed with IV lasix.  Her echo during that hospitalization revealed LVEF 55-60% with mild LVH and grade 1 diastolic dysfunction.  Her heart catheterization was complicated by a small R groin hematoma.  She followed up with her PCP, Dr. Para Marchuncan, who wondered if she may have microvascular angina given her improvement with a beta blocker.  At her last appointment Ms. Ravi continued to complain of intermittent chest pain.  Carvedilol was increased and she was started on aspirin and rosuvastatin.  Ms. Traci [name] has been feeling well.  She notes occasional twinges of R sided chest pain that occur randomly both at rest and with exertion.  She gets tired after walking to her car and has to stop and catch her breath.  She is hoping to start back going to the gym.  Ms. Traci [name] denies lower extremity edema, orthopnea or PND.  Past Medical History:  Diagnosis Date  . Abnormal alkaline phosphatase test    with neg eval prev  . Arthritis   . Depression    Previously responded to Wellbutrin  . Headache(784.0)   . Hyperlipidemia   . Hypothyroidism   . Migraines   . Stroke (HCC) 12/2000  . Thyroid disease    Hypothyroidism     Past Surgical History:  Procedure Laterality Date  . CHOLECYSTECTOMY     ? 20 years ago  . LAPAROSCOPIC GASTRIC BANDING  2006  . LEFT HEART CATH AND CORONARY ANGIOGRAPHY N/A 11/22/2016   Procedure: Left Heart Cath and Coronary Angiography;  Surgeon: Iran OuchMuhammad A Arida, MD;  [facility]: [facility];  Service: Cardiovascular;  Laterality: N/A;  . TOTAL KNEE ARTHROPLASTY Right 11/26/2012   Procedure: TOTAL KNEE ARTHROPLASTY- right ;  Surgeon: Velna OchsPeter G Dalldorf, MD;  Location: [facility] OR;  Service: Orthopedics;  Laterality: Right;     Current Outpatient Prescriptions  Medication Sig Dispense Refill  . albuterol (PROVENTIL HFA;VENTOLIN HFA) 108 (90 Base) MCG/ACT inhaler Inhale 2 puffs into the lungs every 6 (six) hours as needed for wheezing or shortness of breath. 1 Inhaler 0  . aspirin EC 81 MG tablet Take 81 mg by mouth daily.    [name]. aspirin-acetaminophen-caffeine (EXCEDRIN MIGRAINE) 250-250-65 MG tablet Take 1 tablet by mouth every 8 (eight) hours as needed for migraine. 30 tablet 0  . buPROPion (WELLBUTRIN SR) 150 MG 12 hr tablet TAKE ONE TABLET BY MOUTH TWICE DAILY. 180 tablet 1  . carvedilol (COREG) 6.25 MG tablet Take 1 tablet (6.25 mg total) by mouth 2 (two) times daily with a meal. 180 tablet 1  . cyanocobalamin 100 MCG tablet Take 100 mcg by mouth daily.    [name]. levothyroxine (SYNTHROID, LEVOTHROID) 150 MCG tablet Take 1 tablet (  150 mcg total) by mouth daily.    . Multiple Vitamin (MULTIVITAMIN) tablet Take 1 tablet by mouth daily.     [name] omeprazole (PRILOSEC) 20 MG capsule TAKE ONE CAPSULE BY MOUTH ONCE DAILY 30 capsule 5  . rosuvastatin (CRESTOR) 20 MG tablet Take 1 tablet (20 mg total) by mouth daily. 90 tablet 1   No current facility-administered medications for this visit.     Allergies:   Lexapro [escitalopram oxalate]; Meperidine hcl; Simvastatin; Codeine; and Tramadol    Social History:  The patient  reports that she has never smoked. She has never used smokeless tobacco. She  reports that she does not drink alcohol or use drugs.   Family History:  The patient's family history includes Cancer in her other; Diabetes in her mother; Healthy in her brother; Heart attack in her father; Heart disease in her other; Melanoma in her sister; Stroke in her mother.    ROS:  Please see the history of present illness.   Otherwise, review of systems are positive for none.   All other systems are reviewed and negative.    PHYSICAL EXAM: VS:  BP (!) 145/91   Pulse 79   Ht 5\' 5"  (1.651 m)   Wt 94.6 kg (208 lb 9.6 oz)   BMI 34.71 kg/m  , BMI Body mass index is 34.71 kg/m. GENERAL:  Well appearing. No acute distress HEENT:  Pupils equal round and reactive, fundi not visualized, oral mucosa unremarkable NECK:  No jugular venous distention, waveform within normal limits, carotid upstroke brisk and symmetric, no bruits LUNGS:  Clear to auscultation bilaterally.  No crackles, rhonchi or wheezes HEART:  RRR.  PMI not displaced or sustained,S1 and S2 within normal limits, no S3, no S4, no clicks, no rubs, no murmurs ABD:  Flat, positive bowel sounds normal in frequency in pitch, no bruits, no rebound, no guarding, no midline pulsatile mass, no hepatomegaly, no splenomegaly EXT:  2 plus pulses throughout, no edema, no cyanosis no clubbing SKIN:  No rashes no nodules NEURO:  Cranial nerves II through XII grossly intact, motor grossly intact throughout PSYCH:  Cognitively intact, oriented to person place and time   EKG:  EKG is not ordered today. The ekg ordered 11/21/16 demonstrates sinus rhythm. Rate 91 bpm.  LHC 11/22/16  The left ventricular systolic function is normal.  LV end diastolic pressure is mildly elevated.  The left ventricular ejection fraction is 50-55% by visual estimate.  1. Minor luminal irregularities with no evidence of obstructive coronary artery disease. 2. Low normal LV systolic function with an EF of 50-55%. Mildly elevated left ventricular end-diastolic  pressure (LVEDP between 16-20 mmHg).  Echo 11/22/16: Study Conclusions  - Left ventricle: The cavity size was normal. Wall thickness was   increased in a pattern of mild LVH. Systolic function was normal.   The estimated ejection fraction was in the range of 55% to 60%.   Wall motion was normal; there were no regional wall motion   abnormalities. Doppler parameters are consistent with abnormal   left ventricular relaxation (grade 1 diastolic dysfunction). - Aortic valve: There was no stenosis. - Mitral valve: There was no significant regurgitation. - Left atrium: The atrium was mildly dilated. - Right ventricle: The cavity size was normal. Systolic function   was normal. - Pulmonary arteries: No complete TR doppler jet so unable to   estimate PA systolic pressure. - Inferior vena cava: The vessel was normal in size. The   respirophasic diameter changes were  in the normal range (>= 50%),   consistent with normal central venous pressure.  Impressions:  - Normal LV size with mild LV hypertrophy. EF 55-60%. Normal RV   size and systolic function. No significant valvular   abnormalities.   Recent Labs: 04/17/2016: TSH 0.93 11/24/2016: Hemoglobin 11.9; Platelets 225 01/25/2017: ALT 13; BUN 12; Creatinine, Ser 0.89; Potassium 4.4; Sodium 140    Lipid Panel    Component Value Date/Time   CHOL 109 01/25/2017 0742   TRIG 170.0 (H) 01/25/2017 0742   HDL 31.40 (L) 01/25/2017 0742   CHOLHDL 3 01/25/2017 0742   VLDL 34.0 01/25/2017 0742   LDLCALC 44 01/25/2017 0742   LDLDIRECT 103.6 07/17/2011 0920      Wt Readings from Last 3 Encounters:  01/31/17 94.6 kg (208 lb 9.6 oz)  12/01/16 93.4 kg (206 lb)  11/28/16 93.7 kg (206 lb 8 oz)      ASSESSMENT AND PLAN:  # Hypertensive heart disease:  Blood pressure remains above goal. She did not take her BP medications today.  We will not make any changes. I asked her to check her blood pressure daily for one week. Her goal is less than  130/80. If it is greater than that she will give [place] a call. Continue carvedilol.   # Chest pain: # Chronic diastolic dysfunction: Symptoms are not cardiac. She has no obstructive coronary disease on cardiac catheterization. She is euvolemic.  Continue carvedilol. She was encouraged to start exercising 90-150 minutes weekly.   # Hyperlipidemia:  ASCVD 10 year risk is 8.8%. LDL 44 on rosuvastatin.  Continue aspirin 81 mg daily.   Current medicines are reviewed at length with the patient today.  The patient does not have concerns regarding medicines.  The following changes have been made: none  Labs/ tests ordered today include:   No orders of the defined types were placed in this encounter.    Disposition:   FU with Randy Castrejon C. Duke Salvia, MD, [facility] in 6 months    This note was written with the assistance of speech recognition software.  Please excuse any transcriptional errors.  Signed, Fread Kottke C. Duke Salvia, MD, [facility]  01/31/2017 9:16 AM    [facility]

## 2017-01-31 ENCOUNTER — Ambulatory Visit (INDEPENDENT_AMBULATORY_CARE_PROVIDER_SITE_OTHER): Payer: 59 | Admitting: Cardiovascular Disease

## 2017-01-31 ENCOUNTER — Encounter: Payer: Self-pay | Admitting: *Deleted

## 2017-01-31 ENCOUNTER — Encounter: Payer: Self-pay | Admitting: Cardiovascular Disease

## 2017-01-31 VITALS — BP 145/91 | HR 79 | Ht 65.0 in | Wt 208.6 lb

## 2017-01-31 DIAGNOSIS — R0789 Other chest pain: Secondary | ICD-10-CM | POA: Diagnosis not present

## 2017-01-31 DIAGNOSIS — R0602 Shortness of breath: Secondary | ICD-10-CM

## 2017-01-31 DIAGNOSIS — I5032 Chronic diastolic (congestive) heart failure: Secondary | ICD-10-CM | POA: Diagnosis not present

## 2017-01-31 DIAGNOSIS — I110 Hypertensive heart disease with heart failure: Secondary | ICD-10-CM

## 2017-01-31 NOTE — Patient Instructions (Addendum)
Medication Instructions:  Your physician recommends that you continue on your current medications as directed. Please refer to the Current Medication list given to you today.  Labwork: none  Testing/Procedures: none  Follow-Up: Your physician wants you to follow-up in: 6 month ov You will receive a reminder letter in the mail two months in advance. If you don't receive a letter, please call our office to schedule the follow-up appointment.  Any Other Special Instructions Will Be Listed Below (If Applicable). CHECK YOUR BLOOD PRESSURE EVERY DAY FOR 1 WEEK, CALL THE OFFICE IF NO IMPROVEMENT   If you need a refill on your cardiac medications before your next appointment, please call your pharmacy.

## 2017-03-05 ENCOUNTER — Other Ambulatory Visit: Payer: Self-pay | Admitting: *Deleted

## 2017-03-05 ENCOUNTER — Other Ambulatory Visit: Payer: Self-pay | Admitting: Family Medicine

## 2017-03-05 DIAGNOSIS — Z1231 Encounter for screening mammogram for malignant neoplasm of breast: Secondary | ICD-10-CM

## 2017-03-05 NOTE — Telephone Encounter (Signed)
Faxed refill request. Levothyroxine Last office visit:   11/28/2016 Last Filled:   11/28/2016   ? Quantity  Refill request directions state:  Take one tablet by mouth once daily except for one half tablet on Sunday and Wednesdays, total of 6 tablets per 7 days.  Directions in EMR state:  Take 1 tablet by mouth daily.  Please advise.

## 2017-03-05 NOTE — Telephone Encounter (Signed)
Verify with patient, I thought she was on one tablet by mouth once daily except for one half tablet on Sunday and Wednesdays, total of 6 tablets per 7 days (17050mcg/tab).  I don't want her to change what she has been doing since her most recent TSH values have been normal. Thanks.

## 2017-03-06 NOTE — Telephone Encounter (Signed)
Left message on patient's voicemail to return call

## 2017-03-08 MED ORDER — LEVOTHYROXINE SODIUM 150 MCG PO TABS: ORAL_TABLET | ORAL | 1 refills | Status: DC

## 2017-03-28 ENCOUNTER — Ambulatory Visit
Admission: RE | Admit: 2017-03-28 | Discharge: 2017-03-28 | Disposition: A | Discharge status: Home / Self Care | Payer: 59 | Source: Ambulatory Visit | Attending: Family Medicine | Admitting: Family Medicine

## 2017-03-28 DIAGNOSIS — Z1231 Encounter for screening mammogram for malignant neoplasm of breast: Secondary | ICD-10-CM

## 2017-03-29 ENCOUNTER — Encounter: Payer: Self-pay | Admitting: *Deleted

## 2017-04-23 ENCOUNTER — Encounter: Payer: Self-pay | Admitting: Family Medicine

## 2017-06-12 ENCOUNTER — Other Ambulatory Visit: Payer: Self-pay | Admitting: Cardiovascular Disease

## 2017-06-12 ENCOUNTER — Ambulatory Visit: Payer: Self-pay

## 2017-06-12 NOTE — Telephone Encounter (Signed)
Refill Request.  

## 2017-06-22 ENCOUNTER — Ambulatory Visit (INDEPENDENT_AMBULATORY_CARE_PROVIDER_SITE_OTHER): Payer: 59

## 2017-06-22 DIAGNOSIS — Z23 Encounter for immunization: Secondary | ICD-10-CM | POA: Diagnosis not present

## 2017-07-29 NOTE — Progress Notes (Signed)
Cardiology Office Note   Date:  07/30/2017   ID:  Quantia, Grullon 1956/03/02, MRN [id number]  PCP:  Traci Nam, MD  Cardiologist:   Traci Si, MD   No chief complaint on file.     History of Present Illness: Traci [name] is a 61 y.o. female with hypertension, hyperlipidemia, prior stroke, fibromyalgia, s/p gastric band, and hypothyroidism who presents for follow up.  Traci [name] was admitted to the hospital 11/21/16 with three weeks of intermittent chest pain.  Cardiac enzymes were negative.  She underwent LHC 11/22/16 and had mild luminal irregularities but no obstructive disease.  LVEF was 50-55%.  LVEDP was mildly elevated at 16-20 mmHg.  BP was poorly controlled in the hospital.  She was started on carvedilol and diuresed with IV lasix.  Her echo during that hospitalization revealed LVEF 55-60% with mild LVH and grade 1 diastolic dysfunction.  Her heart catheterization was complicated by a small R groin hematoma.  She followed up with her PCP, Dr. Para March, who wondered if she may have microvascular angina given her improvement with a beta blocker.  She continued to complain of intermittent chest pain.  Carvedilol was increased and she was started on aspirin and rosuvastatin.    Since her last appointment Traci [name] has been doing well.  She has been exercising at [employer] 2-3 days per week.  She feels great with exercise and has no chest pain or shortness of breath.  She rides an exercise bike for 30 minutes and then lifts weights on the machines.  Her only complaint is that she gets short of breath when walking 1.5 blocks from her car to her job.  She sometimes has to stop and catch her breath.  She denies lower extremity edema, orthopnea, or PND.  She has noticed that she start has started going to bed earlier.  She attributes this to getting old she denies palpitations, lightheadedness, or dizziness..     Past Medical History:  Diagnosis Date  .  Abnormal alkaline phosphatase test    with neg eval prev  . Arthritis   . Depression    Previously responded to Wellbutrin  . Headache(784.0)   . Hyperlipidemia   . Hypothyroidism   . Migraines   . Stroke (HCC) 12/2000  . Thyroid disease    Hypothyroidism    Past Surgical History:  Procedure Laterality Date  . CHOLECYSTECTOMY     ? 20 years ago  . LAPAROSCOPIC GASTRIC BANDING  2006  . LEFT HEART CATH AND CORONARY ANGIOGRAPHY N/A 11/22/2016   Procedure: Left Heart Cath and Coronary Angiography;  Surgeon: Traci Ouch, MD;  Location: [facility];  Service: Cardiovascular;  Laterality: N/A;  . TOTAL KNEE ARTHROPLASTY Right 11/26/2012   Procedure: TOTAL KNEE ARTHROPLASTY- right ;  Surgeon: Traci Ochs, MD;  Location: [facility] OR;  Service: Orthopedics;  Laterality: Right;     Current Outpatient Medications  Medication Sig Dispense Refill  . aspirin EC 81 MG tablet Take 81 mg by mouth daily.    [name] aspirin-acetaminophen-caffeine (EXCEDRIN MIGRAINE) 250-250-65 MG tablet Take 1 tablet by mouth every 8 (eight) hours as needed for migraine. 30 tablet 0  . buPROPion (WELLBUTRIN SR) 150 MG 12 hr tablet TAKE ONE TABLET BY MOUTH TWICE DAILY. 180 tablet 1  . carvedilol (COREG) 6.25 MG tablet Take 1 tablet (6.25 mg total) by mouth 2 (two) times daily with a meal. 180 tablet 1  . cyanocobalamin 100 MCG  tablet Take 100 mcg by mouth daily.    [name]. levothyroxine (SYNTHROID, LEVOTHROID) 150 MCG tablet Take 1 tablet by mouth daily except one half tablet (75 mcg total) on Sundays. 90 tablet 1  . Multiple Vitamin (MULTIVITAMIN) tablet Take 1 tablet by mouth daily.     [name]. omeprazole (PRILOSEC) 20 MG capsule TAKE ONE CAPSULE BY MOUTH ONCE DAILY 30 capsule 5  . rosuvastatin (CRESTOR) 20 MG tablet TAKE 1 TABLET BY MOUTH ONCE DAILY 90 tablet 0   No current facility-administered medications for this visit.     Allergies:   Lexapro [escitalopram oxalate]; Meperidine hcl; Simvastatin; Codeine; and  Tramadol    Social History:  The patient  reports that  has never smoked. she has never used smokeless tobacco. She reports that she does not drink alcohol or use drugs.   Family History:  The patient's family history includes Cancer in her other; Diabetes in her mother; Healthy in her brother; Heart attack in her father; Heart disease in her other; Melanoma in her sister; Stroke in her mother.    ROS:  Please see the history of present illness.   Otherwise, review of systems are positive for none.   All other systems are reviewed and negative.    PHYSICAL EXAM: VS:  BP 126/82   Pulse 81   Ht 5' 5.5" (1.664 m)   Wt 92.8 kg (204 lb 9.6 oz)   BMI 33.53 kg/m  , BMI Body mass index is 33.53 kg/m. GENERAL:  Well appearing.  No acute distress HEENT: Pupils equal round and reactive, fundi not visualized, oral mucosa unremarkable NECK:  No jugular venous distention, waveform within normal limits, carotid upstroke brisk and symmetric, no bruits, no thyromegaly LUNGS:  Clear to auscultation bilaterally HEART:  RRR.  PMI not displaced or sustained,S1 and S2 within normal limits, no S3, no S4, no clicks, no rubs, no murmurs ABD:  Flat, positive bowel sounds normal in frequency in pitch, no bruits, no rebound, no guarding, no midline pulsatile mass, no hepatomegaly, no splenomegaly EXT:  2 plus pulses throughout, no edema, no cyanosis no clubbing SKIN:  No rashes no nodules NEURO:  Cranial nerves II through XII grossly intact, motor grossly intact throughout PSYCH:  Cognitively intact, oriented to person place and time    EKG:  EKG is ordered today. The ekg ordered 11/21/16 demonstrates sinus rhythm. Rate 91 bpm. 07/30/17: Sinus rhythm.  Rate 81 bpm.   LHC 11/22/16  The left ventricular systolic function is normal.  LV end diastolic pressure is mildly elevated.  The left ventricular ejection fraction is 50-55% by visual estimate.  1. Minor luminal irregularities with no evidence of  obstructive coronary artery disease. 2. Low normal LV systolic function with an EF of 50-55%. Mildly elevated left ventricular end-diastolic pressure (LVEDP between 16-20 mmHg).  Echo 11/22/16: Study Conclusions  - Left ventricle: The cavity size was normal. Wall thickness was   increased in a pattern of mild LVH. Systolic function was normal.   The estimated ejection fraction was in the range of 55% to 60%.   Wall motion was normal; there were no regional wall motion   abnormalities. Doppler parameters are consistent with abnormal   left ventricular relaxation (grade 1 diastolic dysfunction). - Aortic valve: There was no stenosis. - Mitral valve: There was no significant regurgitation. - Left atrium: The atrium was mildly dilated. - Right ventricle: The cavity size was normal. Systolic function   was normal. - Pulmonary arteries: No complete TR  doppler jet so unable to   estimate PA systolic pressure. - Inferior vena cava: The vessel was normal in size. The   respirophasic diameter changes were in the normal range (>= 50%),   consistent with normal central venous pressure.  Impressions:  - Normal LV size with mild LV hypertrophy. EF 55-60%. Normal RV   size and systolic function. No significant valvular   abnormalities.   Recent Labs: 11/24/2016: Hemoglobin 11.9; Platelets 225 01/25/2017: ALT 13; BUN 12; Creatinine, Ser 0.89; Potassium 4.4; Sodium 140    Lipid Panel    Component Value Date/Time   CHOL 109 01/25/2017 0742   TRIG 170.0 (H) 01/25/2017 0742   HDL 31.40 (L) 01/25/2017 0742   CHOLHDL 3 01/25/2017 0742   VLDL 34.0 01/25/2017 0742   LDLCALC 44 01/25/2017 0742   LDLDIRECT 103.6 07/17/2011 0920      Wt Readings from Last 3 Encounters:  07/30/17 92.8 kg (204 lb 9.6 oz)  01/31/17 94.6 kg (208 lb 9.6 oz)  12/01/16 93.4 kg (206 lb)      ASSESSMENT AND PLAN:  # Hypertensive heart disease:  Blood pressure is well-controlled.  Continue carvedilol.    #  Chest pain: # Chronic diastolic dysfunction: Resolved.  She has shortness of breath on exertion when walking long distances but is able to exercise at the gym without symptoms.  She has no heart failure symptoms and appears euvolemic on exam.  Blood pressures is well-controlled.  Continue carvedilol.  # Hyperlipidemia:  ASCVD 10 year risk is 8.8%. LDL 44 01/2017 on rosuvastatin.  Continue aspirin 81 mg daily.  Repeat lipids and CMP at follow up.   Current medicines are reviewed at length with the patient today.  The patient does not have concerns regarding medicines.  The following changes have been made: none  Labs/ tests ordered today include:   Orders Placed This Encounter  Procedures  . EKG 12-Lead     Disposition:   FU with Aziel Morgan C. Duke Salviaandolph, MD, [facility] months    This note was written with the assistance of speech recognition software.  Please excuse any transcriptional errors.  Signed, Callie Facey C. Duke Salviaandolph, MD, [facility]  07/30/2017 8:42 AM    [facility]

## 2017-07-30 ENCOUNTER — Encounter: Payer: Self-pay | Admitting: Cardiovascular Disease

## 2017-07-30 ENCOUNTER — Ambulatory Visit: Payer: 59 | Admitting: Cardiovascular Disease

## 2017-07-30 VITALS — BP 126/82 | HR 81 | Ht 65.5 in | Wt 204.6 lb

## 2017-07-30 DIAGNOSIS — I10 Essential (primary) hypertension: Secondary | ICD-10-CM

## 2017-07-30 DIAGNOSIS — R0602 Shortness of breath: Secondary | ICD-10-CM

## 2017-07-30 DIAGNOSIS — E7800 Pure hypercholesterolemia, unspecified: Secondary | ICD-10-CM | POA: Diagnosis not present

## 2017-07-30 DIAGNOSIS — I110 Hypertensive heart disease with heart failure: Secondary | ICD-10-CM

## 2017-07-30 NOTE — Patient Instructions (Signed)
Dr Dixon recommends that you schedule a follow-up appointment in 6 months. You will receive a reminder letter in the mail two months in advance. If you don't receive a letter, please call our office to schedule the follow-up appointment.  If you need a refill on your cardiac medications before your next appointment, please call your pharmacy. 

## 2017-08-07 ENCOUNTER — Other Ambulatory Visit: Payer: Self-pay | Admitting: Family Medicine

## 2017-08-20 ENCOUNTER — Other Ambulatory Visit: Payer: Self-pay | Admitting: Family Medicine

## 2017-09-07 ENCOUNTER — Other Ambulatory Visit: Payer: Self-pay | Admitting: Cardiovascular Disease

## 2017-09-07 ENCOUNTER — Other Ambulatory Visit: Payer: Self-pay | Admitting: Family Medicine

## 2017-09-07 NOTE — Telephone Encounter (Signed)
Electronic refill request. Levothyroxine Last office visit:   11/28/16 Last Filled:    90 tablet 1 03/08/2017  Does patient need TSH?  Please advise.

## 2017-09-07 NOTE — Telephone Encounter (Signed)
Please review for refill, Thanks !  

## 2017-09-09 NOTE — Telephone Encounter (Signed)
Due for CPE with labs ahead of time, including TSH.  rx sent.  Thanks.

## 2017-09-17 ENCOUNTER — Other Ambulatory Visit: Payer: Self-pay | Admitting: Cardiovascular Disease

## 2017-09-17 NOTE — Telephone Encounter (Signed)
Please review for refill, Thanks !  

## 2017-10-22 DIAGNOSIS — Z09 Encounter for follow-up examination after completed treatment for conditions other than malignant neoplasm: Secondary | ICD-10-CM | POA: Diagnosis not present

## 2017-10-22 DIAGNOSIS — M25561 Pain in right knee: Secondary | ICD-10-CM | POA: Diagnosis not present

## 2017-10-22 DIAGNOSIS — M545 Low back pain: Secondary | ICD-10-CM | POA: Diagnosis not present

## 2017-10-22 DIAGNOSIS — Z96651 Presence of right artificial knee joint: Secondary | ICD-10-CM | POA: Diagnosis not present

## 2017-10-23 DIAGNOSIS — M533 Sacrococcygeal disorders, not elsewhere classified: Secondary | ICD-10-CM | POA: Diagnosis not present

## 2017-11-06 DIAGNOSIS — Z23 Encounter for immunization: Secondary | ICD-10-CM | POA: Diagnosis not present

## 2017-11-08 DIAGNOSIS — M533 Sacrococcygeal disorders, not elsewhere classified: Secondary | ICD-10-CM | POA: Diagnosis not present

## 2017-11-09 ENCOUNTER — Encounter: Payer: Self-pay | Admitting: Family Medicine

## 2017-11-16 ENCOUNTER — Other Ambulatory Visit: Payer: Self-pay | Admitting: Family Medicine

## 2017-11-27 DIAGNOSIS — M545 Low back pain: Secondary | ICD-10-CM | POA: Diagnosis not present

## 2017-12-08 DIAGNOSIS — M545 Low back pain: Secondary | ICD-10-CM | POA: Diagnosis not present

## 2017-12-10 DIAGNOSIS — M5136 Other intervertebral disc degeneration, lumbar region: Secondary | ICD-10-CM | POA: Diagnosis not present

## 2017-12-17 ENCOUNTER — Other Ambulatory Visit: Payer: Self-pay | Admitting: Family Medicine

## 2017-12-17 NOTE — Telephone Encounter (Signed)
Lm on pts vm and advised OV required for additional refills

## 2017-12-24 ENCOUNTER — Encounter: Payer: Self-pay | Admitting: Family Medicine

## 2017-12-24 ENCOUNTER — Ambulatory Visit: Payer: 59 | Admitting: Family Medicine

## 2017-12-24 ENCOUNTER — Encounter: Payer: Self-pay | Admitting: *Deleted

## 2017-12-24 VITALS — BP 122/80 | HR 94 | Temp 98.3°F | Wt 208.5 lb

## 2017-12-24 DIAGNOSIS — E039 Hypothyroidism, unspecified: Secondary | ICD-10-CM | POA: Diagnosis not present

## 2017-12-24 DIAGNOSIS — Z124 Encounter for screening for malignant neoplasm of cervix: Secondary | ICD-10-CM | POA: Diagnosis not present

## 2017-12-24 DIAGNOSIS — M549 Dorsalgia, unspecified: Secondary | ICD-10-CM

## 2017-12-24 DIAGNOSIS — Z1211 Encounter for screening for malignant neoplasm of colon: Secondary | ICD-10-CM | POA: Diagnosis not present

## 2017-12-24 DIAGNOSIS — R251 Tremor, unspecified: Secondary | ICD-10-CM | POA: Diagnosis not present

## 2017-12-24 DIAGNOSIS — F32A Depression, unspecified: Secondary | ICD-10-CM

## 2017-12-24 DIAGNOSIS — E785 Hyperlipidemia, unspecified: Secondary | ICD-10-CM | POA: Diagnosis not present

## 2017-12-24 DIAGNOSIS — F329 Major depressive disorder, single episode, unspecified: Secondary | ICD-10-CM | POA: Diagnosis not present

## 2017-12-24 LAB — COMPREHENSIVE METABOLIC PANEL
ALT: 18 U/L (ref 0–35)
AST: 20 U/L (ref 0–37)
Albumin: 4.1 g/dL (ref 3.5–5.2)
Alkaline Phosphatase: 126 U/L — ABNORMAL HIGH (ref 39–117)
BUN: 14 mg/dL (ref 6–23)
CO2: 26 mEq/L (ref 19–32)
Calcium: 9.2 mg/dL (ref 8.4–10.5)
Chloride: 106 mEq/L (ref 96–112)
Creatinine, Ser: 0.86 mg/dL (ref 0.40–1.20)
GFR: 70.77 mL/min (ref >60.00)
Glucose, Bld: 91 mg/dL (ref 70–99)
Potassium: 4.5 mEq/L (ref 3.5–5.1)
Sodium: 141 mEq/L (ref 135–145)
Total Bilirubin: 0.5 mg/dL (ref 0.2–1.2)
Total Protein: 6.6 g/dL (ref 6.0–8.3)

## 2017-12-24 LAB — LIPID PANEL
Cholesterol: 110 mg/dL (ref 0–200)
HDL: 33.7 mg/dL — ABNORMAL LOW (ref >39.00)
LDL Cholesterol: 44 mg/dL (ref 0–99)
NonHDL: 75.87
Total CHOL/HDL Ratio: 3
Triglycerides: 157 mg/dL — ABNORMAL HIGH (ref 0.0–149.0)
VLDL: 31.4 mg/dL (ref 0.0–40.0)

## 2017-12-24 LAB — TSH: TSH: 0.16 u[IU]/mL — ABNORMAL LOW (ref 0.35–4.50)

## 2017-12-24 NOTE — Progress Notes (Signed)
She was injected by ortho but didn't get better and is going to have repeat injection done later this week.   She is temporarily off ASA for the injection.  She hasn't been able to exercise as much as she would like due to pain in her legs.  D/w pt.    D/w patient Traci [name]:[id number]:options for colon cancer screening, including IFOB vs. Colonoscopy vs cologard.  Risks and benefits of both were discussed and patient voiced understanding.  Pt elects for: cologuard.    Pap smear d/w pt.  D/w pt about getting her ortho situation addressed first.  Defer for now.    Mood d/w pt.  "I think I'm doing good."  She has some irritability but is managing that.  No SI/HI.  She isn't low like prev.  Home situation is tolerable with mult roommates.  No ADE on med.    Hypothyroidism.  Taking 6.5 tabs per week per rx.  Compliant.  Due for labs.  No dysphagia.   Elevated Cholesterol: Using medications without problems:yes Muscle aches: likely not from statin.  Diet compliance:yes Exercise: limited recently   She has handwriting changes over the last year with tremor with handwriting only.  D/w pt.  L handed.   PMH and SH reviewed  ROS: Per HPI unless specifically indicated in ROS section   Meds, vitals, and allergies reviewed.   GEN: nad, alert and oriented HEENT: mucous membranes moist NECK: supple w/o LA CV: rrr. PULM: ctab, no inc wob ABD: soft, +bs EXT: no edema SKIN: no acute rash She does not have tremor with rest or with arm/hand extension but she clearly has tremor affecting handwriting.  Left-handed.  Strength and sensation grossly intact otherwise in the extremities.

## 2017-12-24 NOTE — Patient Instructions (Addendum)
We'll get cologuard set up.  Check to make sure your insurance will cover it.   Go to the lab on the way out.  We'll contact you with your lab report. Take care.  Glad to see you. We will call about your referral.  Shirlee LimerickMarion or Alvina Chounastasiya will call you if you don't see one of them on the way out.

## 2017-12-25 DIAGNOSIS — Z1211 Encounter for screening for malignant neoplasm of colon: Secondary | ICD-10-CM | POA: Insufficient documentation

## 2017-12-25 DIAGNOSIS — R251 Tremor, unspecified: Secondary | ICD-10-CM | POA: Insufficient documentation

## 2017-12-25 DIAGNOSIS — Z124 Encounter for screening for malignant neoplasm of cervix: Secondary | ICD-10-CM | POA: Insufficient documentation

## 2017-12-25 NOTE — Assessment & Plan Note (Addendum)
See notes on labs.  Taking 6.5 tabs per week per rx.  Compliant.  No dysphagia.

## 2017-12-25 NOTE — Assessment & Plan Note (Signed)
Per ortho.  

## 2017-12-25 NOTE — Assessment & Plan Note (Signed)
Continue statin.  See notes on labs.  Exercise limited recently.  D/w pt about diet.

## 2017-12-25 NOTE — Assessment & Plan Note (Addendum)
She has handwriting changes over the last year with tremor with handwriting only.  This is an atypical situation.  She does not have weakness.  I would like neurology input.  Refer.  Discussed with patient.  She agrees. >25 minutes spent in face to face time with patient, >50% spent in counselling or coordination of care, discussing tremor, lipids, hypothyroidism, colon cancer screening, etc.

## 2017-12-25 NOTE — Assessment & Plan Note (Signed)
D/w patient ZO:[id number]:options for colon cancer screening, including IFOB vs. Colonoscopy vs cologard.  Risks and benefits of both were discussed and patient voiced understanding.  Pt elects for: cologuard.

## 2017-12-25 NOTE — Assessment & Plan Note (Signed)
Pap smear d/w pt.  D/w pt about getting her ortho situation addressed first given her level of discomfort.  Defer pap for now.  She agrees.

## 2017-12-25 NOTE — Assessment & Plan Note (Signed)
Mood d/w pt.  "I think I'm doing good."  She has some irritability but is managing that.  No SI/HI.  She isn't low like prev.  Home situation is tolerable with mult roommates.  No ADE on med.

## 2017-12-26 DIAGNOSIS — M5136 Other intervertebral disc degeneration, lumbar region: Secondary | ICD-10-CM | POA: Diagnosis not present

## 2017-12-31 ENCOUNTER — Other Ambulatory Visit: Payer: Self-pay | Admitting: Family Medicine

## 2017-12-31 DIAGNOSIS — E039 Hypothyroidism, unspecified: Secondary | ICD-10-CM

## 2017-12-31 MED ORDER — LEVOTHYROXINE SODIUM 150 MCG PO TABS: ORAL_TABLET | ORAL | Status: DC

## 2018-01-11 ENCOUNTER — Other Ambulatory Visit: Payer: Self-pay | Admitting: Family Medicine

## 2018-01-14 ENCOUNTER — Telehealth: Payer: Self-pay

## 2018-01-14 NOTE — Telephone Encounter (Signed)
Left message for patient to call Angeleah Labrake back-Kingjames Coury V Torryn Hudspeth, RMA  

## 2018-01-25 ENCOUNTER — Telehealth: Payer: Self-pay

## 2018-01-25 NOTE — Telephone Encounter (Signed)
Spoke with patient in regards to neurology referral. Patient spoke to a nurse at [facility] and was told that she was seen there before for tremor and that they did tests and did not figure out what was wrong and could not help her then and basically that they could not help her now if it is the same issue. She was not offered an appointment to come in still. She is not sure what she needs to do now.  Nerve conduction study is in the chart from November 2015 and patient saw Dr Allena Katz at that time. Patient also saw Dr Tat at least once per Epic in 2015. She also wanted to let you know that years back before she saw [facility] another neurologist told her she had fibromyalgia in her arms, she did not know if this was in her chart. Thank you.Consuella Lose, RMA

## 2018-01-29 ENCOUNTER — Ambulatory Visit: Payer: 59 | Admitting: Cardiovascular Disease

## 2018-01-29 ENCOUNTER — Encounter: Payer: Self-pay | Admitting: Cardiovascular Disease

## 2018-01-29 VITALS — BP 130/84 | HR 78 | Ht 65.5 in | Wt 211.8 lb

## 2018-01-29 DIAGNOSIS — I110 Hypertensive heart disease with heart failure: Secondary | ICD-10-CM | POA: Diagnosis not present

## 2018-01-29 DIAGNOSIS — E7800 Pure hypercholesterolemia, unspecified: Secondary | ICD-10-CM

## 2018-01-29 DIAGNOSIS — I5032 Chronic diastolic (congestive) heart failure: Secondary | ICD-10-CM

## 2018-01-29 DIAGNOSIS — R0602 Shortness of breath: Secondary | ICD-10-CM

## 2018-01-29 NOTE — Patient Instructions (Signed)
Medication Instructions:  Your physician recommends that you continue on your current medications as directed. Please refer to the Current Medication list given to you today.  Labwork: none  Testing/Procedures: none  Follow-Up: Your physician recommends that you schedule a follow-up appointment in: 6 month ov with Bjorn Loser B PA or Eda Paschal [place]  Your physician wants you to follow-up in: 1 year with Dr Fara Chute will receive a reminder letter in the mail two months in advance. If you don't receive a letter, please call our office to schedule the follow-up appointment.  If you need a refill on your cardiac medications before your next appointment, please call your pharmacy.

## 2018-01-29 NOTE — Addendum Note (Signed)
Addended by: Barrie Dunker on: 01/29/2018 09:40 AM   Modules accepted: Orders

## 2018-01-29 NOTE — Progress Notes (Signed)
Cardiology Office Note   Date:  01/29/2018   ID:  Traci, [name] 24-Feb-1956, MRN [id number]  PCP:  Joaquim Nam, MD  Cardiologist:   Chilton Si, MD   Chief Complaint  Patient presents with  . Follow-up    pt denied SOB and chest pain    History of Present Illness: Traci [name] is a 62 y.o. female with hypertension, hyperlipidemia, prior stroke, fibromyalgia, s/p gastric band, and hypothyroidism who presents for follow up.  Traci [name] was admitted to the hospital 11/21/16 with three weeks of intermittent chest pain.  Cardiac enzymes were negative.  She underwent LHC 11/22/16 and had mild luminal irregularities but no obstructive disease.  LVEF was 50-55%.  LVEDP was mildly elevated at 16-20 mmHg.  BP was poorly controlled in the hospital.  She was started on carvedilol and diuresed with IV lasix.  Her echo during that hospitalization revealed LVEF 55-60% with mild LVH and grade 1 diastolic dysfunction.  Her heart catheterization was complicated by a small R groin hematoma.  She followed up with her PCP, Dr. Para March, who wondered if she may have microvascular angina given her improvement with a beta blocker.  She continued to complain of intermittent chest pain.  Carvedilol was increased and she was started on aspirin and rosuvastatin.    Since her last appointment Traci [name] has been doing well.  She injured her back and is been getting injections.  Because of her back pain she is unable to exercise.  She is unsteady on her feet and is unable to walk long distances.  She has not had any chest pain or pressure.  She only gets short of breath when walking outside.  She has no shortness of breath when exercising.  She thinks this may be due to allergies.  She denies lower extremity edema, orthopnea, or PND.  Since her last appointment she has gained 8 pounds.  She attributes this to eating pasta and not exercising.   Past Medical History:  Diagnosis Date  . Abnormal  alkaline phosphatase test    with neg eval prev  . Arthritis   . Depression    Previously responded to Wellbutrin  . Headache(784.0)   . Hyperlipidemia   . Hypothyroidism   . Migraines   . Stroke (HCC) 12/2000  . Thyroid disease    Hypothyroidism    Past Surgical History:  Procedure Laterality Date  . CHOLECYSTECTOMY     ? 20 years ago  . LAPAROSCOPIC GASTRIC BANDING  2006  . LEFT HEART CATH AND CORONARY ANGIOGRAPHY N/A 11/22/2016   Procedure: Left Heart Cath and Coronary Angiography;  Surgeon: Iran Ouch, MD;  Location: [facility] INVASIVE CV [facility];  Service: Cardiovascular;  Laterality: N/A;  . TOTAL KNEE ARTHROPLASTY Right 11/26/2012   Procedure: TOTAL KNEE ARTHROPLASTY- right ;  Surgeon: Velna Ochs, MD;  Location: [facility] OR;  Service: Orthopedics;  Laterality: Right;     Current Outpatient Medications  Medication Sig Dispense Refill  . aspirin EC 81 MG tablet Take 81 mg by mouth daily.    [name] aspirin-acetaminophen-caffeine (EXCEDRIN MIGRAINE) 250-250-65 MG tablet Take 1 tablet by mouth every 8 (eight) hours as needed for migraine. 30 tablet 0  . buPROPion (WELLBUTRIN SR) 150 MG 12 hr tablet TAKE ONE TABLET BY MOUTH TWICE DAILY 60 tablet 5  . carvedilol (COREG) 6.25 MG tablet TAKE 1 TABLET BY MOUTH TWICE DAILY WITH  A  MEAL 180 tablet 1  . cyanocobalamin 100 MCG  tablet Take 100 mcg by mouth daily.    [name] levothyroxine (SYNTHROID, LEVOTHROID) 150 MCG tablet TAKE 1 TABLET BY MOUTH ONCE DAILY EXCEPT SKIP SUNDAYS (6 pills per week)    . Multiple Vitamin (MULTIVITAMIN) tablet Take 1 tablet by mouth daily.     [name] omeprazole (PRILOSEC) 20 MG capsule TAKE 1 CAPSULE BY MOUTH ONCE DAILY 30 capsule 10  . rosuvastatin (CRESTOR) 20 MG tablet TAKE 1 TABLET BY MOUTH ONCE DAILY 90 tablet 3   No current facility-administered medications for this visit.     Allergies:   Lexapro [escitalopram oxalate]; Meperidine hcl; Simvastatin; Codeine; and Tramadol    Social History:  The patient  reports that  she has never smoked. She has never used smokeless tobacco. She reports that she does not drink alcohol or use drugs.   Family History:  The patient's family history includes Cancer in her other; Diabetes in her mother; Healthy in her brother; Heart attack in her father; Heart disease in her other; Melanoma in her sister; Stroke in her mother.    ROS:  Please see the history of present illness.   Otherwise, review of systems are positive for none.   All other systems are reviewed and negative.    PHYSICAL EXAM: VS:  BP 130/84   Pulse 78   Ht 5' 5.5" (1.664 m)   Wt 211 lb 12.8 oz (96.1 kg)   BMI 34.71 kg/m  , BMI Body mass index is 34.71 kg/m. GENERAL:  Well appearing HEENT: Pupils equal round and reactive, fundi not visualized, oral mucosa unremarkable NECK:  No jugular venous distention, waveform within normal limits, carotid upstroke brisk and symmetric, no bruits, no thyromegaly LYMPHATICS:  No cervical adenopathy LUNGS:  Clear to auscultation bilaterally HEART:  RRR.  PMI not displaced or sustained,S1 and S2 within normal limits, no S3, no S4, no clicks, no rubs, no murmurs ABD:  Flat, positive bowel sounds normal in frequency in pitch, no bruits, no rebound, no guarding, no midline pulsatile mass, no hepatomegaly, no splenomegaly EXT:  2 plus pulses throughout, no edema, no cyanosis no clubbing SKIN:  No rashes no nodules NEURO:  Cranial nerves II through XII grossly intact, motor grossly intact throughout PSYCH:  Cognitively intact, oriented to person place and time   EKG:  EKG is ordered today. The ekg ordered 11/21/16 demonstrates sinus rhythm. Rate 91 bpm. 07/30/17: Sinus rhythm.  Rate 81 bpm.  01/29/18: Sinus rhythm.  Rate 78 bpm.    LHC 11/22/16  The left ventricular systolic function is normal.  LV end diastolic pressure is mildly elevated.  The left ventricular ejection fraction is 50-55% by visual estimate.  1. Minor luminal irregularities with no evidence of  obstructive coronary artery disease. 2. Low normal LV systolic function with an EF of 50-55%. Mildly elevated left ventricular end-diastolic pressure (LVEDP between 16-20 mmHg).  Echo 11/22/16: Study Conclusions  - Left ventricle: The cavity size was normal. Wall thickness was   increased in a pattern of mild LVH. Systolic function was normal.   The estimated ejection fraction was in the range of 55% to 60%.   Wall motion was normal; there were no regional wall motion   abnormalities. Doppler parameters are consistent with abnormal   left ventricular relaxation (grade 1 diastolic dysfunction). - Aortic valve: There was no stenosis. - Mitral valve: There was no significant regurgitation. - Left atrium: The atrium was mildly dilated. - Right ventricle: The cavity size was normal. Systolic function   was  normal. - Pulmonary arteries: No complete TR doppler jet so unable to   estimate PA systolic pressure. - Inferior vena cava: The vessel was normal in size. The   respirophasic diameter changes were in the normal range (>= 50%),   consistent with normal central venous pressure.  Impressions:  - Normal LV size with mild LV hypertrophy. EF 55-60%. Normal RV   size and systolic function. No significant valvular   abnormalities.   Recent Labs: 12/24/2017: ALT 18; BUN 14; Creatinine, Ser 0.86; Potassium 4.5; Sodium 141; TSH 0.16    Lipid Panel    Component Value Date/Time   CHOL 110 12/24/2017 1219   TRIG 157.0 (H) 12/24/2017 1219   HDL 33.70 (L) 12/24/2017 1219   CHOLHDL 3 12/24/2017 1219   VLDL 31.4 12/24/2017 1219   LDLCALC 44 12/24/2017 1219   LDLDIRECT 103.6 07/17/2011 0920      Wt Readings from Last 3 Encounters:  01/29/18 211 lb 12.8 oz (96.1 kg)  12/24/17 208 lb 8 oz (94.6 kg)  07/30/17 204 lb 9.6 oz (92.8 kg)      ASSESSMENT AND PLAN:  # Hypertensive heart disease:  Blood pressure is slightly above goal.  It was better on repeat.  Likely higher due to her  weight gain and lack of activity.  She is going to look into what physical exercise that she is able to do with her back and will work on her diet.  Continue carvedilol.  # Chest pain: # Chronic diastolic dysfunction: Resolved.  She has shortness of breath on exertion when walking long distances but is able to exercise at the gym without symptoms.  She has no heart failure symptoms and appears euvolemic on exam.  Blood pressures is well-controlled.  Continue carvedilol.  # Hyperlipidemia:  ASCVD 10 year risk is 8.8%. LDL 44 on 12/2017.  Continue rosuvastatin.   Current medicines are reviewed at length with the patient today.  The patient does not have concerns regarding medicines.  The following changes have been made: none  Labs/ tests ordered today include:   No orders of the defined types were placed in this encounter.    Disposition:   FU with Torry Istre C. Duke Salvia, MD, [facility] in 1 year.  APP in 6 months.     Signed, Haizlee Henton C. Duke Salvia, MD, [facility]  01/29/2018 9:33 AM    [facility]

## 2018-01-30 ENCOUNTER — Telehealth: Payer: Self-pay | Admitting: Cardiovascular Disease

## 2018-01-30 ENCOUNTER — Encounter (INDEPENDENT_AMBULATORY_CARE_PROVIDER_SITE_OTHER): Payer: Self-pay

## 2018-01-30 ENCOUNTER — Encounter: Payer: Self-pay | Admitting: *Deleted

## 2018-01-30 NOTE — Telephone Encounter (Signed)
Patient advised that work note will be made available through [employer].

## 2018-01-30 NOTE — Telephone Encounter (Signed)
Pt calling   Pt need a note for her work saying she was at the doctors office on 5.28.19. Pt work for [employer]. Please advise pt.

## 2018-02-03 NOTE — Telephone Encounter (Signed)
I will ask for input from Dr. Allena KatzPatel who had seen patient prev.   Patient has handwriting changes- worse over the last year- with tremor with handwriting only.  I would appreciate her being rechecked.   Thanks.

## 2018-02-04 DIAGNOSIS — Z23 Encounter for immunization: Secondary | ICD-10-CM | POA: Diagnosis not present

## 2018-02-04 NOTE — Telephone Encounter (Signed)
Reviewed patient's chart with Dr. Arbutus Leasat who has seen patient initially in 2015 for the same complaints.  We will schedule visit Dr. Arbutus Leasat for evaluation of left hand tremor and handwriting changes.   Traci Qin K. Allena KatzPatel, DO

## 2018-02-04 NOTE — Telephone Encounter (Signed)
Many thanks. 

## 2018-02-05 ENCOUNTER — Encounter: Payer: Self-pay | Admitting: Family Medicine

## 2018-02-05 DIAGNOSIS — M5136 Other intervertebral disc degeneration, lumbar region: Secondary | ICD-10-CM | POA: Diagnosis not present

## 2018-02-26 ENCOUNTER — Other Ambulatory Visit (INDEPENDENT_AMBULATORY_CARE_PROVIDER_SITE_OTHER): Payer: 59

## 2018-02-26 ENCOUNTER — Encounter: Payer: Self-pay | Admitting: Radiology

## 2018-02-26 DIAGNOSIS — E039 Hypothyroidism, unspecified: Secondary | ICD-10-CM | POA: Diagnosis not present

## 2018-02-26 DIAGNOSIS — M8580 Other specified disorders of bone density and structure, unspecified site: Secondary | ICD-10-CM | POA: Diagnosis not present

## 2018-02-26 LAB — TSH: TSH: 0.16 u[IU]/mL — ABNORMAL LOW (ref 0.35–4.50)

## 2018-02-26 LAB — VITAMIN D 25 HYDROXY (VIT D DEFICIENCY, FRACTURES): VITD: 19.62 ng/mL — ABNORMAL LOW (ref 30.00–100.00)

## 2018-03-03 ENCOUNTER — Other Ambulatory Visit: Payer: Self-pay | Admitting: Family Medicine

## 2018-03-03 DIAGNOSIS — E559 Vitamin D deficiency, unspecified: Secondary | ICD-10-CM

## 2018-03-03 DIAGNOSIS — E039 Hypothyroidism, unspecified: Secondary | ICD-10-CM

## 2018-03-03 MED ORDER — LEVOTHYROXINE SODIUM 112 MCG PO TABS: 112.0000 ug | ORAL_TABLET | Freq: Every day | ORAL | 3 refills | Status: DC

## 2018-03-03 MED ORDER — VITAMIN D (ERGOCALCIFEROL) 1.25 MG (50000 UNIT) PO CAPS: 50000.0000 [IU] | ORAL_CAPSULE | ORAL | 0 refills | Status: DC

## 2018-03-08 DIAGNOSIS — Z1212 Encounter for screening for malignant neoplasm of rectum: Secondary | ICD-10-CM | POA: Diagnosis not present

## 2018-03-08 DIAGNOSIS — Z1211 Encounter for screening for malignant neoplasm of colon: Secondary | ICD-10-CM | POA: Diagnosis not present

## 2018-03-14 LAB — COLOGUARD: Cologuard: NEGATIVE

## 2018-03-19 ENCOUNTER — Encounter: Payer: Self-pay | Admitting: Family Medicine

## 2018-04-09 ENCOUNTER — Telehealth: Payer: Self-pay | Admitting: Family Medicine

## 2018-04-09 NOTE — Telephone Encounter (Signed)
Copied from CRM [phone]. Topic: Quick Communication - See Telephone Encounter >> Apr 09, 2018  2:48 PM Burchel, Abbi R wrote: CRM for notification. See Telephone encounter for: 04/09/18.  Pt has finished vitamin D supp last week, and would like to know if Dr Para Marchuncan recommends that she come in to repeat labs or for an appt.  Please advise.  Pt: [phone]

## 2018-04-10 DIAGNOSIS — M5136 Other intervertebral disc degeneration, lumbar region: Secondary | ICD-10-CM | POA: Diagnosis not present

## 2018-04-10 NOTE — Telephone Encounter (Addendum)
Patient notified as instructed by telephone and verbalized understanding. Lab appointment scheduled. 

## 2018-04-10 NOTE — Telephone Encounter (Signed)
Yes, recheck Vit D and TSH.  Both orders are in.  Thanks.

## 2018-04-15 ENCOUNTER — Other Ambulatory Visit (INDEPENDENT_AMBULATORY_CARE_PROVIDER_SITE_OTHER): Payer: 59

## 2018-04-15 DIAGNOSIS — E039 Hypothyroidism, unspecified: Secondary | ICD-10-CM | POA: Diagnosis not present

## 2018-04-15 DIAGNOSIS — E559 Vitamin D deficiency, unspecified: Secondary | ICD-10-CM | POA: Diagnosis not present

## 2018-04-15 LAB — VITAMIN D 25 HYDROXY (VIT D DEFICIENCY, FRACTURES): VITD: 23.73 ng/mL — ABNORMAL LOW (ref 30.00–100.00)

## 2018-04-15 LAB — TSH: TSH: 2.06 u[IU]/mL (ref 0.35–4.50)

## 2018-04-19 ENCOUNTER — Other Ambulatory Visit: Payer: Self-pay | Admitting: Family Medicine

## 2018-04-19 DIAGNOSIS — E559 Vitamin D deficiency, unspecified: Secondary | ICD-10-CM

## 2018-04-19 MED ORDER — VITAMIN D (ERGOCALCIFEROL) 1.25 MG (50000 UNIT) PO CAPS: 50000.0000 [IU] | ORAL_CAPSULE | ORAL | 0 refills | Status: DC

## 2018-04-29 ENCOUNTER — Other Ambulatory Visit: Payer: Self-pay | Admitting: Family Medicine

## 2018-04-29 DIAGNOSIS — Z1231 Encounter for screening mammogram for malignant neoplasm of breast: Secondary | ICD-10-CM

## 2018-05-27 ENCOUNTER — Encounter

## 2018-05-27 ENCOUNTER — Ambulatory Visit: Payer: 59 | Admitting: Neurology

## 2018-05-27 ENCOUNTER — Other Ambulatory Visit (INDEPENDENT_AMBULATORY_CARE_PROVIDER_SITE_OTHER): Payer: 59

## 2018-05-27 ENCOUNTER — Encounter: Payer: Self-pay | Admitting: Neurology

## 2018-05-27 VITALS — BP 130/90 | HR 95 | Ht 65.5 in | Wt 213.0 lb

## 2018-05-27 DIAGNOSIS — G3184 Mild cognitive impairment, so stated: Secondary | ICD-10-CM | POA: Diagnosis not present

## 2018-05-27 DIAGNOSIS — M79601 Pain in right arm: Secondary | ICD-10-CM

## 2018-05-27 DIAGNOSIS — F8181 Disorder of written expression: Secondary | ICD-10-CM

## 2018-05-27 DIAGNOSIS — M79602 Pain in left arm: Secondary | ICD-10-CM

## 2018-05-27 LAB — C-REACTIVE PROTEIN: CRP: 0.2 mg/dL — ABNORMAL LOW (ref 0.5–20.0)

## 2018-05-27 LAB — CK: Total CK: 127 U/L (ref 7–177)

## 2018-05-27 LAB — SEDIMENTATION RATE: Sed Rate: 20 mm/hr (ref 0–30)

## 2018-05-27 MED ORDER — PREGABALIN 50 MG PO CAPS: 50.0000 mg | ORAL_CAPSULE | Freq: Two times a day (BID) | ORAL | 5 refills | Status: DC

## 2018-05-27 NOTE — Progress Notes (Signed)
[facility] Clinic Note - Initial Visit   Date: 05/27/18  Traci [name] MRN: [id number] DOB: Jan 24, 1956   Dear Dr. Damita Dunnings:   Thank you for your kind referral of Traci [name] for consultation of hand writing changes. Although her history is well known to you, please allow [place] to reiterate it for the purpose of our medical record. The patient was accompanied to the clinic by self.    History of Present Illness: Traci [name] is a 62 y.o. left-handed Caucasian female with hypothyroidism, depression, hyperlipidemia, prior stroke, and heart failure presenting for evaluation of handwriting difficulty and bilateral arm pain.  She was last seen by me in April 2016 and Dr. Wells Guiles Tat in October 2015 for the same complaints.  Symptoms started in early 2014 with changes in her handwriting, described as jerky and scruffy.  She has pain left forearm with prolonged writing, and has a mild tremor.  She denies imbalance, slowed movements, stiffness.  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   She stopped writing checks in the Spring of 2019 because hand writing was not decipherable and the bank withdrew more than she had written for. 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   Around the same time, she also started having arm soreness both of the upper arms and forearms, as if the muscles were bruised.  She often rubs and massages them, which provides relief.  She does not have weakness, numbness, or tingling.    Previously, she underwent extensive work-up including EMG, MRI brain/C-spine/L-spine, and serology testing.  EMG showed bilateral C8 radiculopathy and left L3-4 radiculopathy, which was also evidence on imaging.  Laboratory testing including inflammatory/autoimmune diseases, nutritional deficiencies, myasthenia gravis, and myopathy has been unremarkable (see below).  She also was evaluated by Dr.  Trudie Reed in Rheumatology whose work-up was negative.  She completed neck physiotherapy, without any improvement.  Of note, in 2002, she had a an episode of acute cognitive problems with slurring of her speech and facial asymmetry and was told she had a TIA.  She has stroke work-up which was negative, except for left ACA stenosis at A1.  Out-side paper records, electronic medical record, and images have been reviewed where available and summarized as:  Lab Results  Component Value Date   TSH 2.06 04/15/2018   EMG 07/22/2014: 1. Chronic C8 radiculopathy affecting bilateral upper extremities, mild in degree electrically. 2. Chronic L3-L4 radiculopathy affecting the left lower extremity, mild in degree electrically. 3. There is no evidence of a generalized sensorimotor polyneuropathy, diffuse myopathy, or widespread disorder of anterior horn cells.  MRI cervical spine wo contrast 08/26/2014: Cervical spondylotic changes most notable C5-6 level followed by the C4-5 level. Summary of pertinent findings include:  C5-6 broad-based disc osteophyte complex greatest right paracentral position with narrowing of the ventral aspect of the thecal sac and very mild cord flattening greater on the right. Minimal foraminal narrowing.  C4-5 small left paracentral disc osteophyte complex with narrowing of the left ventral aspect of the thecal sac. Minimal left foraminal narrowing.  C7-T1 very mild left-sided facet joint degenerative changes. Bulge slightly greater left paracentral position. Minimal indentation left ventral aspect of the thecal sac. Minimal left foraminal narrowing.  C3-4 minimal left paracentral bulge.  MRI lumbar spine 10/16/2014: 1. Slight progression of of moderate bilateral facet arthritis at L3-4 with a new synovial cyst on the right with a slight mass effect upon  the thecal sac and slight deviation of the right L4 nerve within the thecal sac. 2. Stable facet arthritis at L4-5 on the  left and bilaterally at L5-S1.  MRI brain wwo contrast 07/07/2014: Unremarkable appearance of the brain without evidence of acute intracranial abnormality or mass.   Past Medical History:  Diagnosis Date  . Abnormal alkaline phosphatase test    with neg eval prev  . Arthritis   . Depression    Previously responded to Wellbutrin  . Headache(784.0)   . Hyperlipidemia   . Hypothyroidism   . Migraines   . Stroke ([place]) 12/2000  . Thyroid disease    Hypothyroidism    Past Surgical History:  Procedure Laterality Date  . CHOLECYSTECTOMY     ? 20 years ago  . LAPAROSCOPIC GASTRIC BANDING  2006  . LEFT HEART CATH AND CORONARY ANGIOGRAPHY N/A 11/22/2016   Procedure: Left Heart Cath and Coronary Angiography;  Surgeon: Wellington Hampshire, MD;  Location: [facility];  Service: Cardiovascular;  Laterality: N/A;  . TOTAL KNEE ARTHROPLASTY Right 11/26/2012   Procedure: TOTAL KNEE ARTHROPLASTY- right ;  Surgeon: Hessie Dibble, MD;  Location: [facility];  Service: Orthopedics;  Laterality: Right;     Medications:  Outpatient Encounter Medications as of 05/27/2018  Medication Sig  . aspirin EC 81 MG tablet Take 81 mg by mouth daily.  [name] aspirin-acetaminophen-caffeine (EXCEDRIN MIGRAINE) 250-250-65 MG tablet Take 1 tablet by mouth every 8 (eight) hours as needed for migraine.  [name] buPROPion (WELLBUTRIN SR) 150 MG 12 hr tablet TAKE ONE TABLET BY MOUTH TWICE DAILY  . carvedilol (COREG) 6.25 MG tablet TAKE 1 TABLET BY MOUTH TWICE DAILY WITH  A  MEAL  . cyanocobalamin 100 MCG tablet Take 100 mcg by mouth daily.  [name] levothyroxine (SYNTHROID, LEVOTHROID) 112 MCG tablet Take 1 tablet (112 mcg total) by mouth daily before breakfast.  . Multiple Vitamin (MULTIVITAMIN) tablet Take 1 tablet by mouth daily.   [name] omeprazole (PRILOSEC) 20 MG capsule TAKE 1 CAPSULE BY MOUTH ONCE DAILY  . rosuvastatin (CRESTOR) 20 MG tablet TAKE 1 TABLET BY MOUTH ONCE DAILY  . Vitamin D, Ergocalciferol, (DRISDOL) 50000 units  CAPS capsule Take 1 capsule (50,000 Units total) by mouth every 7 (seven) days.   No facility-administered encounter medications on file as of 05/27/2018.      Allergies:  Allergies  Allergen Reactions  . Lexapro [Escitalopram Oxalate] Other (See Comments)    Fatigue, sedation  . Meperidine Hcl Other (See Comments)    Cardiac arrest and syncope  . Simvastatin Other (See Comments)    REACTION: muscle aches  . Codeine Itching  . Tramadol Itching    Family History: Family History  Problem Relation Age of Onset  . Stroke Mother   . Diabetes Mother   . Heart attack Father        Deceased, 72  . Cancer Other        Ovarian, Uterine  . Heart disease Other   . Melanoma Sister   . Healthy Brother   . Colon cancer Neg Hx     Social History: Social History   Tobacco Use  . Smoking status: Never Smoker  . Smokeless tobacco: Never Used  Substance Use Topics  . Alcohol use: No    Alcohol/week: 0.0 standard drinks    Comment: "maybe one on new years"  . Drug use: No   Social History   Social History Narrative   Regular exercise:  Yes  Works for city [employer] level of education:  Secretary/administrator, Radio broadcast assistant    Review of Systems:  CONSTITUTIONAL: No fevers, chills, night sweats, or weight loss.   EYES: No visual changes or eye pain ENT: No hearing changes.  No history of nose bleeds.   RESPIRATORY: No cough, wheezing and shortness of breath.   CARDIOVASCULAR: Negative for chest pain, and palpitations.   GI: Negative for abdominal discomfort, blood in stools or black stools.  No recent change in bowel habits.   GU:  No history of incontinence.   MUSCLOSKELETAL: No history of joint pain or swelling.  +myalgias.   SKIN: Negative for lesions, rash, and itching.   HEMATOLOGY/ONCOLOGY: Negative for prolonged bleeding, bruising easily, and swollen nodes.  No history of cancer.   ENDOCRINE: Negative for cold or heat intolerance, polydipsia or goiter.   PSYCH:  No  depression or anxiety symptoms.   NEURO: As Above.   Vital Signs:  BP 130/90   Pulse 95   Ht 5' 5.5" (1.664 m)   Wt 213 lb (96.6 kg)   SpO2 96%   BMI 34.91 kg/m    General Medical Exam:   General:  Well appearing, comfortable.   Eyes/ENT: see cranial nerve examination.   Neck: No masses appreciated.  Full range of motion without tenderness.  No carotid bruits. Respiratory:  Clear to auscultation, good air entry bilaterally.   Cardiac:  Regular rate and rhythm, no murmur.   Extremities:  No deformities, edema, or skin discoloration.  There is no muscle tenderness. Skin:  No rashes or lesions.  Neurological Exam: MENTAL STATUS including orientation to time, place, person, recent and remote memory, attention span and concentration, language, and fund of knowledge is fairly intact.  Speech is not dysarthric. Montreal Cognitive Assessment  05/27/2018  Visuospatial/ Executive (0/5) 5  Naming (0/3) 3  Attention: Read list of digits (0/2) 2  Attention: Read list of letters (0/1) 1  Attention: Serial 7 subtraction starting at 100 (0/3) 1  Language: Repeat phrase (0/2) 2  Language : Fluency (0/1) 1  Abstraction (0/2) 2  Delayed Recall (0/5) 0  Orientation (0/6) 5  Total 22  Adjusted Score (based on education) 22   CRANIAL NERVES: II:  No visual field defects.  Unremarkable fundi.   III-IV-VI: Pupils equal round and reactive to light.  Normal conjugate, extra-ocular eye movements in all directions of gaze.  No nystagmus.  No ptosis.   V:  Normal facial sensation.     VII:  Normal facial symmetry and movements.   VIII:  Normal hearing and vestibular function.   IX-X:  Normal palatal movement.   XI:  Normal shoulder shrug and head rotation.   XII:  Normal tongue strength and range of motion, no deviation or fasciculation.  MOTOR:  No atrophy, fasciculations or abnormal movements.  No pronator drift.  Tone is normal.  Handwriting has large script and poorly decipherable, there is no  tremor.  Right Upper Extremity:    Left Upper Extremity:    Deltoid  5/5   Deltoid  5/5   Biceps  5/5   Biceps  5/5   Triceps  5/5   Triceps  5/5   Wrist extensors  5/5   Wrist extensors  5/5   Wrist flexors  5/5   Wrist flexors  5/5   Finger extensors  5/5   Finger extensors  5/5   Finger flexors  5/5   Finger flexors  5/5  Dorsal interossei  5/5   Dorsal interossei  5/5   Abductor pollicis  5/5   Abductor pollicis  5/5   Tone (Ashworth scale)  0  Tone (Ashworth scale)  0   Right Lower Extremity:    Left Lower Extremity:    Hip flexors  5/5   Hip flexors  5/5   Hip extensors  5/5   Hip extensors  5/5   Knee flexors  5/5   Knee flexors  5/5   Knee extensors  5/5   Knee extensors  5/5   Dorsiflexors  5/5   Dorsiflexors  5/5   Plantarflexors  5/5   Plantarflexors  5/5   Toe extensors  5/5   Toe extensors  5/5   Toe flexors  5/5   Toe flexors  5/5   Tone (Ashworth scale)  0  Tone (Ashworth scale)  0   MSRs:  Right                                                                 Left brachioradialis 2+  brachioradialis 2+  biceps 2+  biceps 2+  triceps 2+  triceps 2+  patellar 2+  patellar 2+  ankle jerk 2+  ankle jerk 2+  Hoffman no  Hoffman no  plantar response down  plantar response down   SENSORY:  Normal and symmetric perception of light touch, pinprick, vibration, and proprioception.   COORDINATION/GAIT: Normal finger-to- nose-finger .  Intact rapid alternating movements bilaterally.  Able to rise from a chair without using arms.  Gait narrow based and stable. Tandem and stressed gait intact.    IMPRESSION: 1.  Bilateral arm myalgias.  Symptoms are not consistent with primary neuropathic pain.  ?polymyalgia rhematica  - Check ESR, CRP, CK, vitamin B12, ANA  - Offer a trial of Lyrica 89m twice daily  - She can try using compression sleeve   2.  Hand writing changes, ?writing dystonia.    - Consider trial of baclofen or very low dose Botox (flexor carpi ulnaris ~ 10  units) going forward  3.  Mild cognitive impairment as noted on MOCA (22/30, missing points in nearly all domains, especially with delayed recall)  - Formal neuropsychological testing  Return to clinic after testing   Thank you for allowing me to participate in patient's care.  If I can answer any additional questions, I would be pleased to do so.    Sincerely,    Haly Feher K. PPosey Pronto DO

## 2018-05-27 NOTE — Patient Instructions (Addendum)
Check labs  Start Lyrica 50mg  twice daily  Please call my office to schedule nerve testing, if pain gets worse  Neuropsychological testing.  You have been referred for a neurocognitive evaluation in our office.   The evaluation has two parts.   . The first part of the evaluation is a clinical interview with the neuropsychologist (Dr. Elvis CoilMaryBeth Bailar). Please bring someone with you to this appointment if possible, as it is helpful for Dr. Alinda DoomsBailar to hear from both you and another adult who knows you well.   . The second part of the evaluation is testing with Dr. Nigel SloopBailar's technician Annabelle Harman(Dana). The testing includes a variety of tasks- mostly question-and-answer, some paper-and-pencil. There is nothing you need to do to prepare for this appointment, but having a good night's sleep prior to the testing, and bringing eyeglasses and hearing aids (if you wear them), is advised.   Please note: We have to reserve several hours of the neuropsychologist's time and the psychometrician's time for your evaluation appointment. As such, please note that there is a No-Show fee of $100. If you are unable to attend any of your appointments, please contact our office as soon as possible to reschedule.

## 2018-05-28 LAB — VITAMIN B12: Vitamin B-12: 1122 pg/mL — ABNORMAL HIGH (ref 211–911)

## 2018-05-29 LAB — ANA: Anti Nuclear Antibody(ANA): NEGATIVE

## 2018-05-31 ENCOUNTER — Ambulatory Visit
Admission: RE | Admit: 2018-05-31 | Discharge: 2018-05-31 | Disposition: A | Discharge status: Home / Self Care | Payer: 59 | Source: Ambulatory Visit | Attending: Family Medicine | Admitting: Family Medicine

## 2018-05-31 ENCOUNTER — Other Ambulatory Visit (INDEPENDENT_AMBULATORY_CARE_PROVIDER_SITE_OTHER): Payer: 59

## 2018-05-31 DIAGNOSIS — Z1231 Encounter for screening mammogram for malignant neoplasm of breast: Secondary | ICD-10-CM | POA: Diagnosis not present

## 2018-05-31 DIAGNOSIS — E559 Vitamin D deficiency, unspecified: Secondary | ICD-10-CM | POA: Diagnosis not present

## 2018-05-31 LAB — VITAMIN D 25 HYDROXY (VIT D DEFICIENCY, FRACTURES): VITD: 31.13 ng/mL (ref 30.00–100.00)

## 2018-06-02 ENCOUNTER — Other Ambulatory Visit: Payer: Self-pay | Admitting: Family Medicine

## 2018-06-02 MED ORDER — VITAMIN D 50 MCG (2000 UT) PO TABS: 2000.0000 [IU] | ORAL_TABLET | Freq: Every day | ORAL | Status: DC

## 2018-06-03 ENCOUNTER — Encounter: Payer: Self-pay | Admitting: Neurology

## 2018-06-11 ENCOUNTER — Encounter: Payer: Self-pay | Admitting: Family Medicine

## 2018-06-11 ENCOUNTER — Ambulatory Visit: Payer: 59 | Admitting: Family Medicine

## 2018-06-11 DIAGNOSIS — R1110 Vomiting, unspecified: Secondary | ICD-10-CM

## 2018-06-11 MED ORDER — ONDANSETRON HCL 4 MG PO TABS: 4.0000 mg | ORAL_TABLET | Freq: Three times a day (TID) | ORAL | 0 refills | Status: DC | PRN

## 2018-06-11 NOTE — Patient Instructions (Signed)
Use zofran for now for nausea.  You may have diarrhea in the near future.  Rest and fluids.   Gradually work back to solid foods.   Drink enough to keep your urine clear or light colored.  Update me as needed. Out of work for now.  Take care.  Glad to see you.

## 2018-06-11 NOTE — Progress Notes (Signed)
Vit D is back to normal.  She went to [facility] to help a friend move.  She was sore and tired from all of the lifting, as expected.  Then two nights ago she had chills.  Started vomiting two nights ago.  She tolerated liquids yesterday but not solids.  No diarrhea.  No blood in vomit.  Some nausea but not vomiting now.  She is still fatigued.  No fevers.  No other sick contacts.  She isn't lightheaded.  She has some diffuse aches.  No abd pain.  She is some better.   She has f/u with neuro pending; she had abnormal memory testing at the [facility] with neuro.  She is still working and managing her job.    Meds, vitals, and allergies reviewed.   ROS: Per HPI unless specifically indicated in ROS section   GEN: nad, alert and oriented HEENT: mucous membranes moist NECK: supple w/o LA CV: rrr PULM: ctab, no inc wob ABD: soft, +bs, not ttp EXT: no edema SKIN: no acute rash, well perfused.

## 2018-06-12 DIAGNOSIS — R1110 Vomiting, unspecified: Secondary | ICD-10-CM | POA: Insufficient documentation

## 2018-06-12 NOTE — Assessment & Plan Note (Signed)
Not vomiting as of today.  Benign abdominal exam.  Discussed with patient about differential diagnosis.  Since she appears to be some better, defer labs for now.  She agrees.  It may be that she had overexerted herself and also potentially had a viral GI illness. Use zofran for now for nausea.  She may have diarrhea in the near future.  Rest and fluids.   Gradually work back to solid foods.   Drink enough to keep urine clear or light colored.  Update me as needed. Out of work for now.  She agrees to plan.

## 2018-07-09 ENCOUNTER — Telehealth: Payer: Self-pay | Admitting: Neurology

## 2018-07-09 NOTE — Telephone Encounter (Signed)
Dr Bailar is leaving for a new job closer to home we have mailed a letter to the patient to inform them that we canceled all appointments with Dr Bailar. We thank you for the understanding °

## 2018-07-12 ENCOUNTER — Telehealth: Payer: Self-pay | Admitting: Psychology

## 2018-07-12 ENCOUNTER — Telehealth: Payer: Self-pay | Admitting: Neurology

## 2018-07-12 NOTE — Telephone Encounter (Signed)
Left msg regarding Dr. Alinda Dooms leaving - would be happy to refer to [facility].

## 2018-07-12 NOTE — Telephone Encounter (Signed)
Left msg regarding Dr. Alinda Dooms leaving and we would be happy to send referral to [facility].

## 2018-07-15 DIAGNOSIS — Z0100 Encounter for examination of eyes and vision without abnormal findings: Secondary | ICD-10-CM | POA: Diagnosis not present

## 2018-07-24 ENCOUNTER — Telehealth: Payer: Self-pay | Admitting: Neurology

## 2018-07-24 NOTE — Telephone Encounter (Signed)
Patient states that she does not want to go to [facility] to see neuropshy and she wants to go somewhere up here she left a VM with this information

## 2018-07-24 NOTE — Telephone Encounter (Signed)
I will try to find somewhere closer for her.

## 2018-07-30 ENCOUNTER — Telehealth: Payer: Self-pay | Admitting: Neurology

## 2018-07-30 NOTE — Telephone Encounter (Signed)
Patient received her letter to inform her that Traci [name] has left our office. [place] [facility] [place] [facility] [place] [facility] please let patient know when and where the referral was sent to

## 2018-07-30 NOTE — Telephone Encounter (Signed)
I will send referral.  

## 2018-08-06 ENCOUNTER — Encounter: Payer: Self-pay | Admitting: *Deleted

## 2018-08-20 ENCOUNTER — Other Ambulatory Visit: Payer: Self-pay

## 2018-08-20 MED ORDER — ROSUVASTATIN CALCIUM 20 MG PO TABS: 20.0000 mg | ORAL_TABLET | Freq: Every day | ORAL | 3 refills | Status: DC

## 2018-09-03 ENCOUNTER — Other Ambulatory Visit: Payer: Self-pay | Admitting: Family Medicine

## 2018-09-22 ENCOUNTER — Other Ambulatory Visit: Payer: Self-pay | Admitting: Cardiovascular Disease

## 2018-09-23 NOTE — Telephone Encounter (Signed)
Rx has been sent to the pharmacy electronically. ° °

## 2018-09-26 ENCOUNTER — Telehealth: Payer: Self-pay | Admitting: Neurology

## 2018-09-26 NOTE — Telephone Encounter (Signed)
Patient is calling in about neuropsych testing. She does not want to go to [facility] and is having trouble getting into [facility]. She seemed really confused. Please call her back at [phone]. Thanks!

## 2018-09-27 ENCOUNTER — Telehealth: Payer: Self-pay | Admitting: Neurology

## 2018-09-27 NOTE — Telephone Encounter (Signed)
Called patient and left message that she can wait to be seen at [facility] or come back to [place] when we have a new neuropsych doctor.

## 2018-09-27 NOTE — Telephone Encounter (Signed)
Called patient back to follow up regarding the issue with seeing a neuropsychologist. Left a message for patient to return call.

## 2018-10-06 IMAGING — MG DIGITAL SCREENING BILATERAL MAMMOGRAM WITH TOMO AND CAD
6 of 16 series · 6 of 40 positions shown · non-contrast
Comparison: Previous exam(s).

CLINICAL DATA: Screening.

EXAM:
DIGITAL SCREENING BILATERAL MAMMOGRAM WITH TOMO AND CAD

[R CC synth-2D]
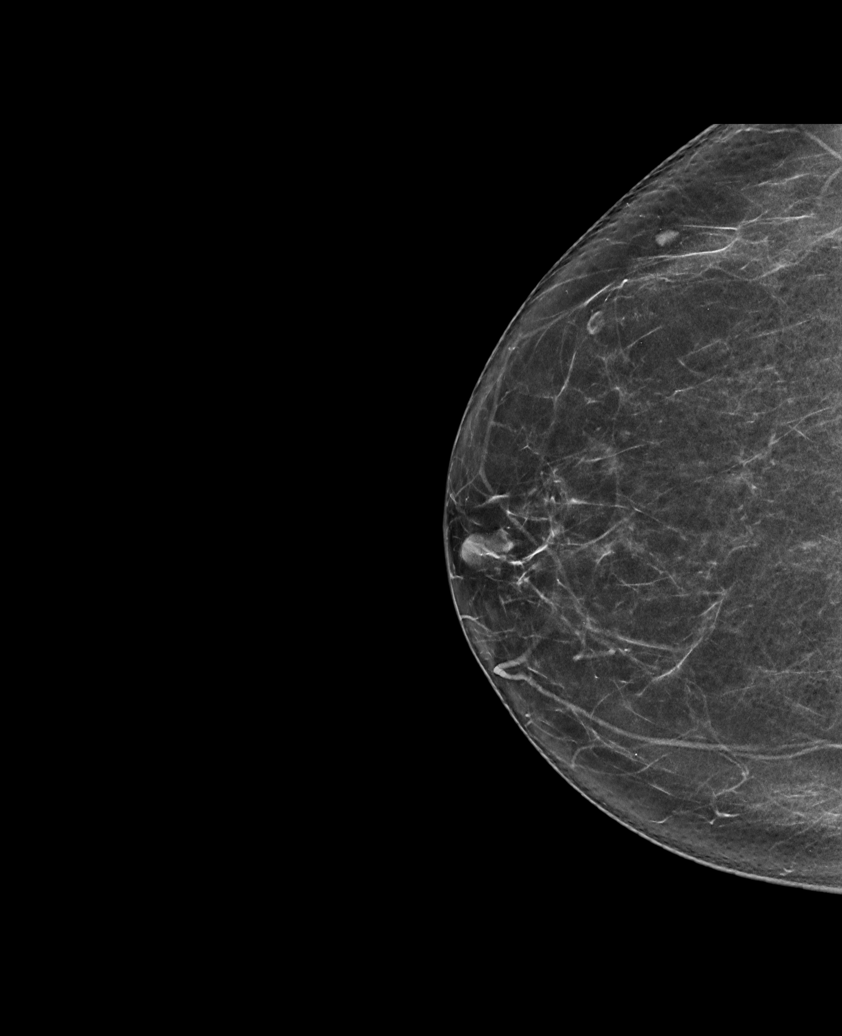

[R MLO synth-2D (1 of 3)]
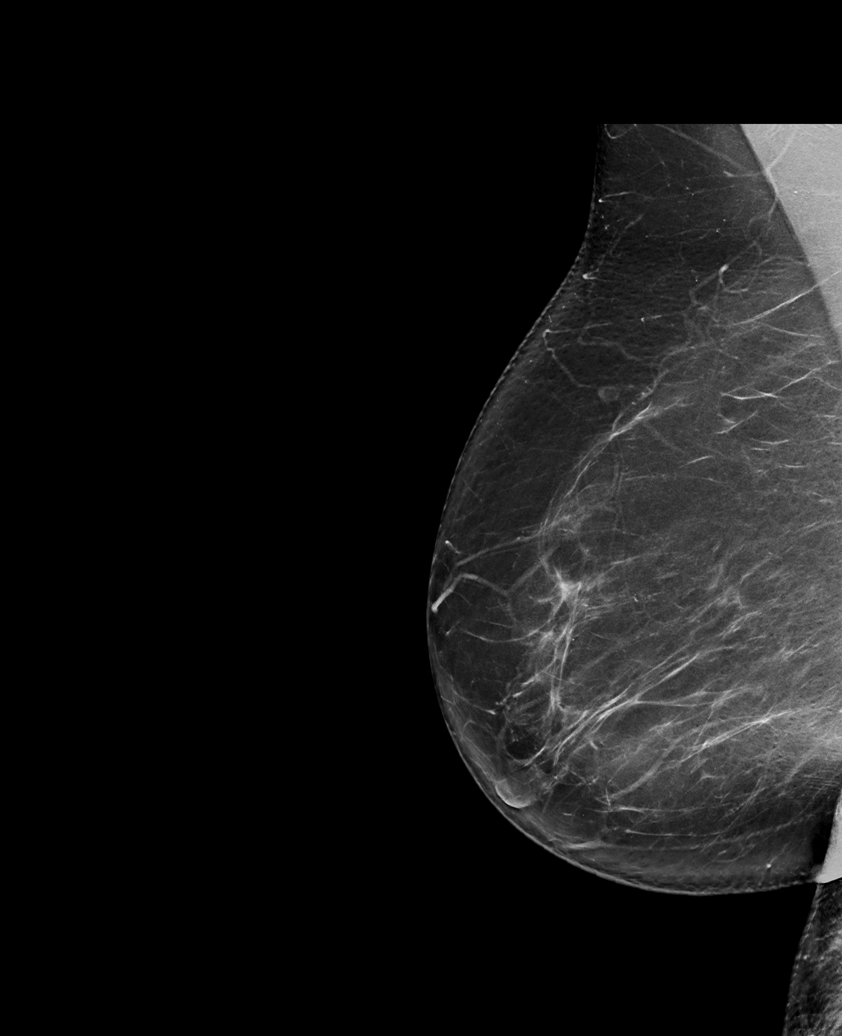

[L CC synth-2D]
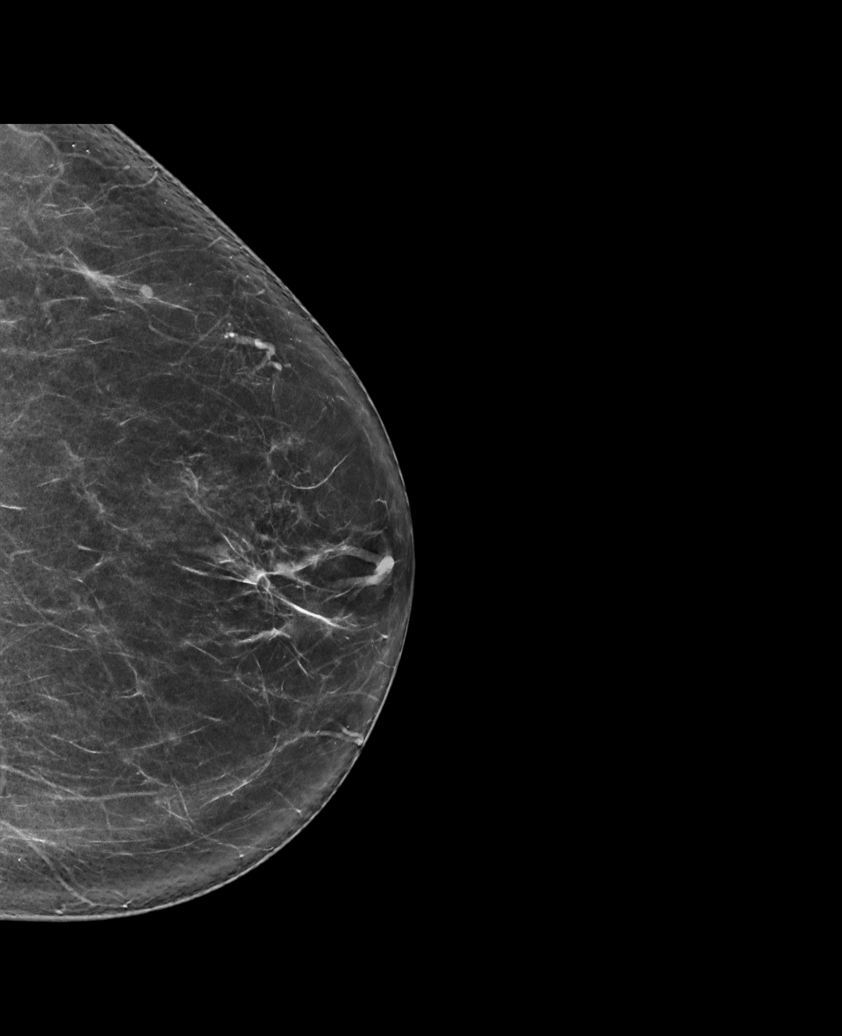

[L MLO synth-2D]
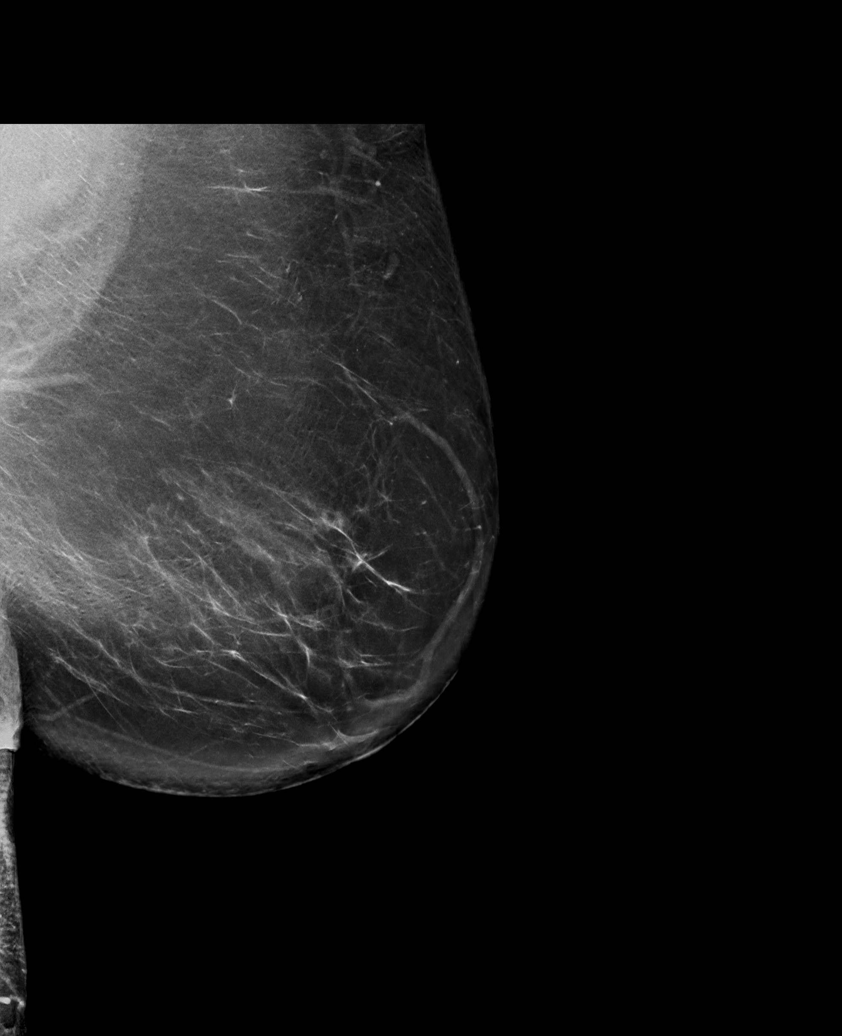

[R MLO synth-2D (2 of 3)]
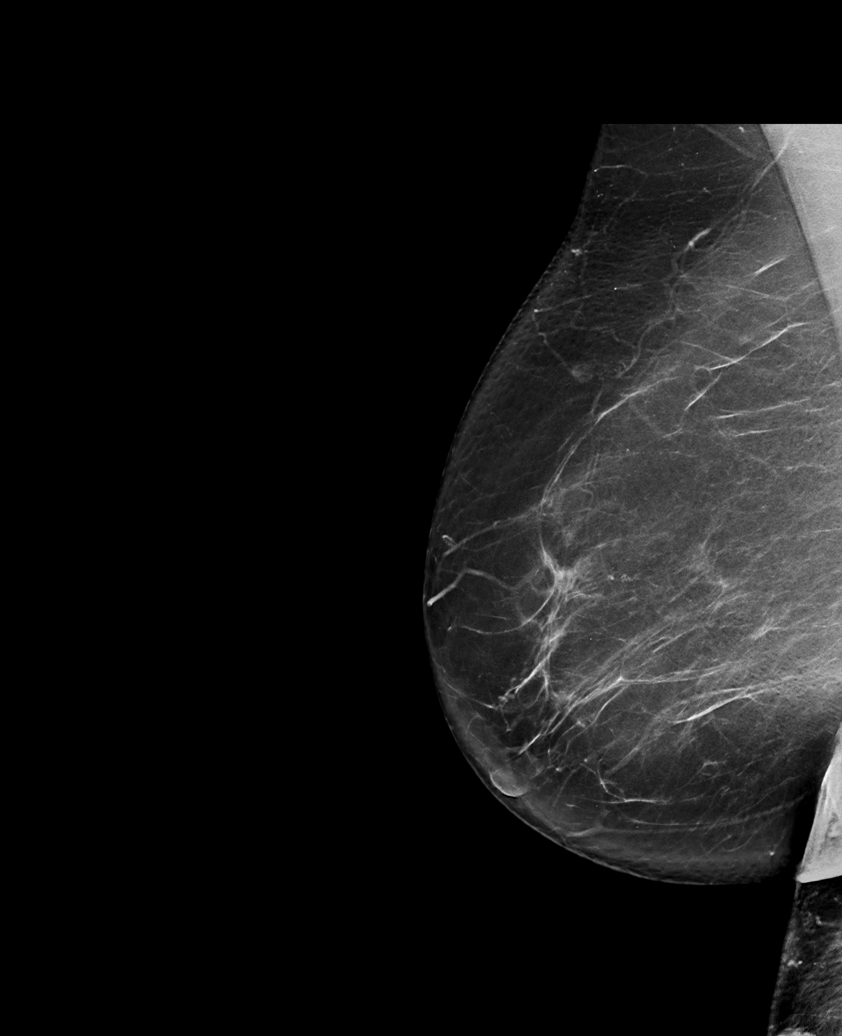

[R MLO synth-2D (3 of 3)]
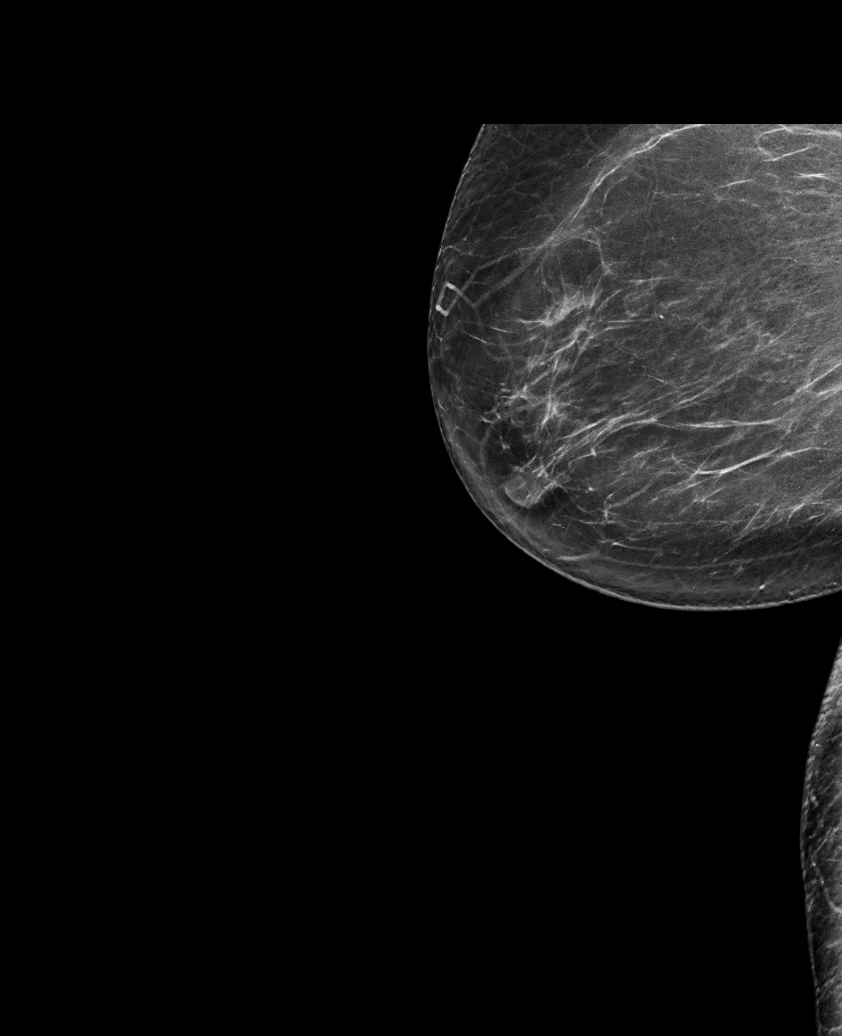

[6 of 40 positions shown; findings below may reference images not displayed]

ACR Breast Density Category b: There are scattered areas of
fibroglandular density.
FINDINGS: There are no findings suspicious for malignancy. Images were
processed with CAD.
IMPRESSION: No mammographic evidence of malignancy. A result letter of this
screening mammogram will be mailed directly to the patient.

RECOMMENDATION:
Screening mammogram in one year. (Code:[id number])

BI-RADS CATEGORY  1: Negative.

## 2018-10-11 ENCOUNTER — Telehealth: Payer: Self-pay | Admitting: Neurology

## 2018-10-11 NOTE — Telephone Encounter (Signed)
Noted  

## 2018-10-11 NOTE — Telephone Encounter (Signed)
Patient wanted you to know that she was having a hard time getting testing appt with neuropsych and she was just gonna wait until we got a replacement Bailar. Just FYI. Thanks!

## 2018-10-14 ENCOUNTER — Encounter: Payer: Self-pay | Admitting: Family Medicine

## 2018-10-14 ENCOUNTER — Telehealth: Payer: Self-pay | Admitting: Psychology

## 2018-10-14 NOTE — Telephone Encounter (Signed)
Left a detailed voicemail regarding a referral to a neuropsychologist. Asked patient to please return my call.

## 2018-10-29 ENCOUNTER — Encounter: Payer: Self-pay | Admitting: Psychology

## 2018-11-12 ENCOUNTER — Encounter: Payer: Self-pay | Admitting: Family Medicine

## 2018-12-02 ENCOUNTER — Other Ambulatory Visit: Payer: Self-pay | Admitting: Neurology

## 2018-12-10 ENCOUNTER — Telehealth: Payer: Self-pay | Admitting: Physician Assistant

## 2018-12-10 NOTE — Telephone Encounter (Signed)
Virtual Visit Pre-Appointment Phone Call  Steps For Call:  1. Confirm consent - "In the setting of the current Covid19 crisis, you are scheduled for a (phone or video) visit with your provider on (date) at (time).  Just as we do with many in-office visits, in order for you to participate in this visit, we must obtain consent.  If you'd like, I can send this to your mychart (if signed up) or email for you to review.  Otherwise, I can obtain your verbal consent now.  All virtual visits are billed to your insurance company just like a normal visit would be.  By agreeing to a virtual visit, we'd like you to understand that the technology does not allow for your provider to perform an examination, and thus may limit your provider's ability to fully assess your condition.  Finally, though the technology is pretty good, we cannot assure that it will always work on either your or our end, and in the setting of a video visit, we may have to convert it to a phone-only visit.  In either situation, we cannot ensure that we have a secure connection.  Are you willing to proceed?"  2. Give patient instructions for WebEx download to smartphone as below if video visit  3. Advise patient to be prepared with any vital sign or heart rhythm information, their current medicines, and a piece of paper and pen handy for any instructions they may receive the day of their visit  4. Inform patient they will receive a phone call 15 minutes prior to their appointment time (may be from unknown caller ID) so they should be prepared to answer  5. Confirm that appointment type is correct in Epic appointment notes (video vs telephone)    TELEPHONE CALL NOTE  Traci [name] has been deemed a candidate for a follow-up tele-health visit to limit community exposure during the Covid-19 pandemic. I spoke with the patient via phone to ensure availability of phone/video source, confirm preferred email & phone number, and discuss  instructions and expectations.  I reminded Traci [name] to be prepared with any vital sign and/or heart rhythm information that could potentially be obtained via home monitoring, at the time of her visit. I reminded Traci [name] to expect a phone call at the time of her visit if her visit.  Did the patient verbally acknowledge consent to treatment? Yes  Traci [name] 12/10/2018 4:51 PM  Pt does not have smartphone and does not have internet. She is able to check vitals on day of appt. Medications and allergies reviewed.    CONSENT FOR TELE-HEALTH VISIT - PLEASE REVIEW  I hereby voluntarily request, consent and authorize [facility] and its employed or contracted physicians, physician assistants, nurse practitioners or other licensed health care professionals (the Practitioner), to provide me with telemedicine health care services (the Services") as deemed necessary by the treating Practitioner. I acknowledge and consent to receive the Services by the Practitioner via telemedicine. I understand that the telemedicine visit will involve communicating with the Practitioner through live audiovisual communication technology and the disclosure of certain medical information by electronic transmission. I acknowledge that I have been given the opportunity to request an in-person assessment or other available alternative prior to the telemedicine visit and am voluntarily participating in the telemedicine visit.  I understand that I have the right to withhold or withdraw my consent to the use of telemedicine in the course of my care at any time,  without affecting my right to future care or treatment, and that the Practitioner or I may terminate the telemedicine visit at any time. I understand that I have the right to inspect all information obtained and/or recorded in the course of the telemedicine visit and may receive copies of available information for a reasonable fee.  I understand that some  of the potential risks of receiving the Services via telemedicine include:   Delay or interruption in medical evaluation due to technological equipment failure or disruption;  Information transmitted may not be sufficient (e.g. poor resolution of images) to allow for appropriate medical decision making by the Practitioner; and/or   In rare instances, security protocols could fail, causing a breach of personal health information.  Furthermore, I acknowledge that it is my responsibility to provide information about my medical history, conditions and care that is complete and accurate to the best of my ability. I acknowledge that Practitioner's advice, recommendations, and/or decision may be based on factors not within their control, such as incomplete or inaccurate data provided by me or distortions of diagnostic images or specimens that may result from electronic transmissions. I understand that the practice of medicine is not an exact science and that Practitioner makes no warranties or guarantees regarding treatment outcomes. I acknowledge that I will receive a copy of this consent concurrently upon execution via email to the email address I last provided but may also request a printed copy by calling the office of [facility].    I understand that my insurance will be billed for this visit.   I have read or had this consent read to me.  I understand the contents of this consent, which adequately explains the benefits and risks of the Services being provided via telemedicine.   I have been provided ample opportunity to ask questions regarding this consent and the Services and have had my questions answered to my satisfaction.  I give my informed consent for the services to be provided through the use of telemedicine in my medical care  By participating in this telemedicine visit I agree to the above.

## 2018-12-11 ENCOUNTER — Telehealth: Payer: Self-pay | Admitting: Physician Assistant

## 2018-12-11 ENCOUNTER — Other Ambulatory Visit: Payer: Self-pay

## 2018-12-11 NOTE — Progress Notes (Signed)
Virtual Visit via Telephone Note   This visit type was conducted due to national recommendations for restrictions regarding the COVID-19 Pandemic (e.g. social distancing) in an effort to limit this patient's exposure and mitigate transmission in our community.  Due to her co-morbid illnesses, this patient is at least at moderate risk for complications without adequate follow up.  This format is felt to be most appropriate for this patient at this time.  The patient did not have access to video technology/had technical difficulties with video requiring transitioning to audio format only (telephone).  All issues noted in this document were discussed and addressed.  No physical exam could be performed with this format.  Please refer to the patient's chart for her  consent to telehealth for [facility].  Evaluation Performed:  Follow-up visit  This visit type was conducted due to national recommendations for restrictions regarding the COVID-19 Pandemic (e.g. social distancing).  This format is felt to be most appropriate for this patient at this time.  All issues noted in this document were discussed and addressed.  No physical exam was performed (except for noted visual exam findings with Video Visits).  Please refer to the patient's chart (MyChart message for video visits and phone note for telephone visits) for the patient's consent to telehealth [facility].  Date:  12/12/2018   ID:  Traci [name], DOB [birth year], MRN [id number]  Patient Location:  Home  Provider location:   Office  PCP:  Joaquim Namuncan, Graham S, MD  Cardiologist:  Chilton Siiffany [place], MD 01/29/2018 Electrophysiologist:  None   Chief Complaint:  Follow up  History of Present Illness:    Traci NoseMary Lynn Delahunty is a 63 y.o. female who presents via audio/video conferencing for a telehealth visit today.  She is a 4331-month follow-up  63 y.o. yo female who has a hx of D-CHF, HTN, HLD, CVA, hypothryoid, gastric banding,  fibromyalgia, CP 11/2016 s/p cath w/ minor irreg, nl EF  The patient does not have symptoms concerning for COVID-19 infection (fever, chills, cough, or new shortness of breath).   She is walking around the neighborhood now, was working out 3 days a week at [employer]. She is not getting CP or SOB with this.  No LE edema, no orthopnea. Has had PND but not since she got a new bed. Sleeps w/ head of bed up a bit due to GERD.   No palpitations, no presyncope or syncope.    Prior CV studies:   The following studies were reviewed today:  CARDIAC CATH: 11/22/2016  The left ventricular systolic function is normal.  LV end diastolic pressure is mildly elevated.  The left ventricular ejection fraction is 50-55% by visual estimate.  1. Minor luminal irregularities with no evidence of obstructive coronary artery disease. 2. Low normal LV systolic function with an EF of 50-55%. Mildly elevated left ventricular end-diastolic pressure (LVEDP between 16-20 mmHg).  ECHO: 11/22/2016 - Left ventricle: The cavity size was normal. Wall thickness was   increased in a pattern of mild LVH. Systolic function was normal.   The estimated ejection fraction was in the range of 55% to 60%.   Wall motion was normal; there were no regional wall motion   abnormalities. Doppler parameters are consistent with abnormal   left ventricular relaxation (grade 1 diastolic dysfunction). - Aortic valve: There was no stenosis. - Mitral valve: There was no significant regurgitation. - Left atrium: The atrium was mildly dilated. - Right ventricle:  The cavity size was normal. Systolic function   was normal. - Pulmonary arteries: No complete TR doppler jet so unable to   estimate PA systolic pressure. - Inferior vena cava: The vessel was normal in size. The   respirophasic diameter changes were in the normal range (>= 50%),   consistent with normal central venous pressure. Impressions: - Normal LV size with mild LV  hypertrophy. EF 55-60%. Normal RV   size and systolic function. No significant valvular   abnormalities.   Past Medical History:  Diagnosis Date  . Abnormal alkaline phosphatase test    with neg eval prev  . Arthritis   . Depression    Previously responded to Wellbutrin  . Headache(784.0)   . Hyperlipidemia   . Hypothyroidism   . Migraines   . Stroke (HCC) 12/2000  . Thyroid disease    Hypothyroidism   Past Surgical History:  Procedure Laterality Date  . CHOLECYSTECTOMY     ? 20 years ago  . LAPAROSCOPIC GASTRIC BANDING  2006  . LEFT HEART CATH AND CORONARY ANGIOGRAPHY N/A 11/22/2016   Procedure: Left Heart Cath and Coronary Angiography;  Surgeon: Iran Ouch, MD;  Location: [facility] INVASIVE CV LAB;  Service: Cardiovascular;  Laterality: N/A;  . TOTAL KNEE ARTHROPLASTY Right 11/26/2012   Procedure: TOTAL KNEE ARTHROPLASTY- right ;  Surgeon: Velna Ochs, MD;  Location: [facility] OR;  Service: Orthopedics;  Laterality: Right;     Current Meds  Medication Sig  . aspirin EC 81 MG tablet Take 81 mg by mouth daily.  [name] aspirin-acetaminophen-caffeine (EXCEDRIN MIGRAINE) 250-250-65 MG tablet Take 1 tablet by mouth every 8 (eight) hours as needed for migraine.  [name] buPROPion (WELLBUTRIN SR) 150 MG 12 hr tablet TAKE 1 TABLET BY MOUTH TWICE DAILY  . carvedilol (COREG) 6.25 MG tablet TAKE 1 TABLET BY MOUTH TWICE DAILY WITH  A  MEAL.  [name] Cholecalciferol (VITAMIN D) 2000 units tablet Take 1 tablet (2,000 Units total) by mouth daily.  . cyanocobalamin 100 MCG tablet Take 100 mcg by mouth daily.  [name] levothyroxine (SYNTHROID, LEVOTHROID) 112 MCG tablet Take 1 tablet (112 mcg total) by mouth daily before breakfast.  . Multiple Vitamin (MULTIVITAMIN) tablet Take 1 tablet by mouth daily.   [name] omeprazole (PRILOSEC) 20 MG capsule TAKE 1 CAPSULE BY MOUTH ONCE DAILY  . ondansetron (ZOFRAN) 4 MG tablet Take 1 tablet (4 mg total) by mouth every 8 (eight) hours as needed for nausea or vomiting.  . pregabalin  (LYRICA) 50 MG capsule Take 1 capsule by mouth twice daily  . rosuvastatin (CRESTOR) 20 MG tablet Take 1 tablet (20 mg total) by mouth daily.     Allergies:   Lexapro [escitalopram oxalate]; Meperidine hcl; Simvastatin; Codeine; and Tramadol   Social History   Tobacco Use  . Smoking status: Never Smoker  . Smokeless tobacco: Never Used  Substance Use Topics  . Alcohol use: No    Alcohol/week: 0.0 standard drinks    Comment: "maybe one on new years"  . Drug use: No     Family Hx: The patient's family history includes Cancer in an other family member; Diabetes in her mother; Healthy in her brother; Heart attack in her father; Heart disease in an other family member; Melanoma in her sister; Stroke in her mother. There is no history of Colon cancer or Breast cancer.  ROS:   Please see the history of present illness.    All other systems reviewed and are negative.  Labs/Other Tests and  Data Reviewed:    Recent Labs: 12/24/2017: ALT 18; BUN 14; Creatinine, Ser 0.86; Potassium 4.5; Sodium 141 04/15/2018: TSH 2.06   Recent Lipid Panel Lab Results  Component Value Date/Time   CHOL 110 12/24/2017 12:19 PM   TRIG 157.0 (H) 12/24/2017 12:19 PM   HDL 33.70 (L) 12/24/2017 12:19 PM   CHOLHDL 3 12/24/2017 12:19 PM   LDLCALC 44 12/24/2017 12:19 PM   LDLDIRECT 103.6 07/17/2011 09:20 AM    Wt Readings from Last 3 Encounters:  12/12/18 208 lb 9.6 oz (94.6 kg)  06/11/18 210 lb (95.3 kg)  05/27/18 213 lb (96.6 kg)     Objective:    Vital Signs:  BP (!) 133/99   Pulse 79   Ht 5' 5.5" (1.664 m)   Wt 208 lb 9.6 oz (94.6 kg)   BMI 34.18 kg/m    63 y.o. female in no obvious distress on the phone.   ASSESSMENT & PLAN:    1.  Chest pain, hx minimal CAD at cath 2018:  - pt is doing well, no ischemic sx and working on controlling CRFs. - continue exercising, good activity level. - She is working on eating healthy and losing weight.   2. HTN: - BP up a little this am, but she has  not had am meds. - She feels BP is generally good, continue Coreg  3. Chronic diastolic CHF:  - Wt is stable, or trending down - no DOE, LE edema.  - had some PND, ?OSA. However, these sx have not recurred since she started sleeping with the head of the bed a little elevated.  - Advised her that she could have sleep apnea, contact us and get tested if sx recur.  4. HLD - managed by Dr Para March, reminded her that she needs to get labs done soon - Continue Crestor per Dr Para March, LDL is at goal. - Discussed that increased activity is the best way to get HDL up.   COVID-19 Education: The signs and symptoms of COVID-19 were discussed with the patient and how to seek care for testing (follow up with PCP or arrange E-visit).  The importance of social distancing was discussed today.  Patient Risk:   After full review of this patient's clinical status, I feel that they are at least moderate risk at this time.  Time:   Today, I have spent 18 minutes with the patient with telehealth technology discussing cardiology issues and risk factor reduction. Another 20 minutes spent prepping her chart and reviewing previous testing.     Medication Adjustments/Labs and Tests Ordered: Current medicines are reviewed at length with the patient today.  Concerns regarding medicines are outlined above.  Tests Ordered: No orders of the defined types were placed in this encounter.  Medication Changes: No orders of the defined types were placed in this encounter.   Disposition:  Follow up in 6 months with Dr Duke Salvia  Signed, Theodore Demark, PA-C  12/12/2018 8:00 AM    [facility]

## 2018-12-11 NOTE — Telephone Encounter (Signed)
Spoke to pt concerning appt tomorrow with Bjorn Loser. Wanted to see if she could do D[employer] video visit. Pt can only access internet at work. Does not have internet at home and does not have smart phone. Pt would like to stay with telephone visit.

## 2018-12-12 ENCOUNTER — Telehealth (INDEPENDENT_AMBULATORY_CARE_PROVIDER_SITE_OTHER): Payer: 59 | Admitting: Physician Assistant

## 2018-12-12 ENCOUNTER — Encounter: Payer: Self-pay | Admitting: Physician Assistant

## 2018-12-12 ENCOUNTER — Other Ambulatory Visit: Payer: Self-pay | Admitting: Family Medicine

## 2018-12-12 VITALS — BP 133/99 | HR 79 | Ht 65.5 in | Wt 208.6 lb

## 2018-12-12 DIAGNOSIS — I5032 Chronic diastolic (congestive) heart failure: Secondary | ICD-10-CM | POA: Diagnosis not present

## 2018-12-12 DIAGNOSIS — E785 Hyperlipidemia, unspecified: Secondary | ICD-10-CM

## 2018-12-12 DIAGNOSIS — E7800 Pure hypercholesterolemia, unspecified: Secondary | ICD-10-CM

## 2018-12-12 DIAGNOSIS — E039 Hypothyroidism, unspecified: Secondary | ICD-10-CM

## 2018-12-12 DIAGNOSIS — I10 Essential (primary) hypertension: Secondary | ICD-10-CM

## 2018-12-12 DIAGNOSIS — R0789 Other chest pain: Secondary | ICD-10-CM

## 2018-12-12 NOTE — Patient Instructions (Signed)
Medication Instructions:  Theodore Demark, PA-C recommends that you continue on your current medications as directed. Please refer to the Current Medication list given to you today.  If you need a refill on your cardiac medications before your next appointment, please call your pharmacy.   Follow-Up: At [facility], you and your health needs are our priority.  As part of our continuing mission to provide you with exceptional heart care, we have created designated Provider Care Teams.  These Care Teams include your primary Cardiologist (physician) and Advanced Practice Providers (APPs -  Physician Assistants and Nurse Practitioners) who all work together to provide you with the care you need, when you need it. You will need a follow up appointment in 6 months.  Please call our office 2 months in advance to schedule this appointment.  You may see Chilton Si, MD or one of the following Advanced Practice Providers on your designated Care Team:   Corine Shelter, PA-C Judy Pimple, [place] . Marjie Skiff, PA-C . You will receive a reminder letter in the mail two months in advance. If you don't receive a letter, please call our office to schedule the follow-up appointment.

## 2018-12-12 NOTE — Progress Notes (Unsigned)
Please contact patient.  Needs fasting labs prior to web/telephone visit when possible.  I put in the orders for labs.  Thanks.

## 2018-12-17 ENCOUNTER — Telehealth: Payer: Self-pay

## 2018-12-17 NOTE — Progress Notes (Signed)
It says I dont have security to open encounter but patient is scheduled for labs on 12/18/18 @ 9am and Telephone visit with Dr. Para March on 12/20/18 @ 8:45am.

## 2018-12-17 NOTE — Telephone Encounter (Signed)
Left detailed VM w COVID screen and curbside info   

## 2018-12-18 ENCOUNTER — Other Ambulatory Visit (INDEPENDENT_AMBULATORY_CARE_PROVIDER_SITE_OTHER): Payer: 59

## 2018-12-18 ENCOUNTER — Other Ambulatory Visit: Payer: Self-pay

## 2018-12-18 DIAGNOSIS — E785 Hyperlipidemia, unspecified: Secondary | ICD-10-CM

## 2018-12-18 DIAGNOSIS — E039 Hypothyroidism, unspecified: Secondary | ICD-10-CM | POA: Diagnosis not present

## 2018-12-18 LAB — COMPREHENSIVE METABOLIC PANEL
ALT: 16 U/L (ref 0–35)
AST: 19 U/L (ref 0–37)
Albumin: 4 g/dL (ref 3.5–5.2)
Alkaline Phosphatase: 126 U/L — ABNORMAL HIGH (ref 39–117)
BUN: 12 mg/dL (ref 6–23)
CO2: 29 mEq/L (ref 19–32)
Calcium: 8.8 mg/dL (ref 8.4–10.5)
Chloride: 106 mEq/L (ref 96–112)
Creatinine, Ser: 0.94 mg/dL (ref 0.40–1.20)
GFR: 59.9 mL/min — ABNORMAL LOW (ref >60.00)
Glucose, Bld: 96 mg/dL (ref 70–99)
Potassium: 4.3 mEq/L (ref 3.5–5.1)
Sodium: 141 mEq/L (ref 135–145)
Total Bilirubin: 0.4 mg/dL (ref 0.2–1.2)
Total Protein: 6.4 g/dL (ref 6.0–8.3)

## 2018-12-18 LAB — LIPID PANEL
Cholesterol: 101 mg/dL (ref 0–200)
HDL: 29.2 mg/dL — ABNORMAL LOW (ref >39.00)
LDL Cholesterol: 40 mg/dL (ref 0–99)
NonHDL: 71.68
Total CHOL/HDL Ratio: 3
Triglycerides: 156 mg/dL — ABNORMAL HIGH (ref 0.0–149.0)
VLDL: 31.2 mg/dL (ref 0.0–40.0)

## 2018-12-18 LAB — TSH: TSH: 5.36 u[IU]/mL — ABNORMAL HIGH (ref 0.35–4.50)

## 2018-12-20 ENCOUNTER — Encounter: Payer: Self-pay | Admitting: Family Medicine

## 2018-12-20 ENCOUNTER — Ambulatory Visit (INDEPENDENT_AMBULATORY_CARE_PROVIDER_SITE_OTHER): Payer: 59 | Admitting: Family Medicine

## 2018-12-20 DIAGNOSIS — E785 Hyperlipidemia, unspecified: Secondary | ICD-10-CM

## 2018-12-20 DIAGNOSIS — E039 Hypothyroidism, unspecified: Secondary | ICD-10-CM

## 2018-12-20 DIAGNOSIS — R05 Cough: Secondary | ICD-10-CM

## 2018-12-20 DIAGNOSIS — R748 Abnormal levels of other serum enzymes: Secondary | ICD-10-CM

## 2018-12-20 DIAGNOSIS — R059 Cough, unspecified: Secondary | ICD-10-CM

## 2018-12-20 DIAGNOSIS — R29898 Other symptoms and signs involving the musculoskeletal system: Secondary | ICD-10-CM

## 2018-12-20 DIAGNOSIS — E559 Vitamin D deficiency, unspecified: Secondary | ICD-10-CM | POA: Diagnosis not present

## 2018-12-20 MED ORDER — BUPROPION HCL ER (SR) 150 MG PO TB12: 150.0000 mg | ORAL_TABLET | Freq: Two times a day (BID) | ORAL | 3 refills | Status: DC

## 2018-12-20 MED ORDER — LEVOTHYROXINE SODIUM 112 MCG PO TABS: 112.0000 ug | ORAL_TABLET | Freq: Every day | ORAL | 3 refills | Status: DC

## 2018-12-20 MED ORDER — OMEPRAZOLE 20 MG PO CPDR: 20.0000 mg | DELAYED_RELEASE_CAPSULE | Freq: Every day | ORAL | 3 refills | Status: DC

## 2018-12-20 NOTE — Progress Notes (Signed)
Interactive audio and video telecommunications were attempted between this provider and patient, however failed, due to patient having technical difficulties OR patient did not have access to video capability.  We continued and completed visit with audio only.   Virtual Visit via Telephone Note  I connected with patient on 12/20/18 at 8:47 AM by telephone and verified that I am speaking with the correct person using two identifiers.  Location of patient: home.    Location of MD: [facility] Traci of referring provider (if blank then none associated): Names per persons and role in encounter:  MD: Earlyne Iba, Patient: Traci [name].    I discussed the limitations, risks, security and privacy concerns of performing an evaluation and management service by telephone and the availability of in person appointments. I also discussed with the patient that there may be a patient responsible charge related to this service. The patient expressed understanding and agreed to proceed.  History of Present Illness:   Rare excedrin use, rare HA.  She is doing well with headache management overall.  Hypothyroidism.  Complaint.  No neck mass, no dysphagia.  Taking thyroid med on empty stomach.    Some dry cough noted, ie throat clearing.  She has some post nasal gtt that could contribute from seasonal allergies.  D/w pt.  If not better then she can try OTC antihistamines, d/w pt.    Elevated Cholesterol: Using medications without problems: yes Muscle aches: no Diet compliance:yes Exercise:gym is closed due to pandemic Labs d/w pt.   Alk phos elevation at baseline.  LFTs wnl o/w.    Noted that Lyrica helped arm pain but her writing is still poor.  D/w pt about neuropsychiatry testing.  That had to be deferred given lack of availability.  I'll check on options with referral coordinators here at the clinic.  Pandemic considerations d/w pt.   History of vitamin D deficiency.  Discussed with  patient.  We can recheck labs later on.  See orders.  She agrees.   Review of systems: As described [name].  Otherwise noncontributory.  Observations/Objective:nad Speaking in complete sentences.    Assessment and Plan:  History of vitamin D deficiency.  Discussed with patient.  We can recheck labs later on.  See orders.  She agrees.  Headache. Rare excedrin use, rare HA.  She is doing well with headache management overall.   Hypothyroidism.   No change in meds at this point.  Recheck TSH in about 3 months.  She agrees.  HLD.  LDL at goal.  Continue as is.  Continue work on diet and exercise.  She agrees.  Cough.  Likely from postnasal drip If not better then she can try OTC antihistamines, d/w pt.    Lyrica helped arm pain but her writing is still poor.  We can recheck B12 later on.  See orders.  She agrees.  D/w pt about neuropsychiatry testing.  That had to be deferred given lack of availability.  I'll check on options with referral coordinators here at the clinic.    Alk phos elevation at baseline.  LFTs wnl o/w.    Follow Up Instructions:she'll call back about follow up TSH lab visit in about 3 months.     I discussed the assessment and treatment plan with the patient. The patient was provided an opportunity to ask questions and all were answered. The patient agreed with the plan and demonstrated an understanding of the instructions.   The patient was advised to  call back or seek an in-person evaluation if the symptoms worsen or if the condition fails to improve as anticipated.  I provided 26 minutes of non-face-to-face time during this encounter.  Elsie Stain, MD

## 2018-12-22 DIAGNOSIS — E559 Vitamin D deficiency, unspecified: Secondary | ICD-10-CM | POA: Insufficient documentation

## 2018-12-22 DIAGNOSIS — R748 Abnormal levels of other serum enzymes: Secondary | ICD-10-CM | POA: Insufficient documentation

## 2018-12-22 NOTE — Assessment & Plan Note (Signed)
Likely from postnasal drip If not better then she can try OTC antihistamines, d/w pt.

## 2018-12-22 NOTE — Assessment & Plan Note (Signed)
Lyrica helped arm pain but her writing is still poor.  We can recheck B12 later on.  See orders.  She agrees.  D/w pt about neuropsychiatry testing.  That had to be deferred given lack of availability.  I'll check on options with referral coordinators here at the clinic

## 2018-12-22 NOTE — Assessment & Plan Note (Signed)
Alk phos elevation at baseline discussed with patient previous work-up unremarkable.  This is likely baseline for patient.  Continue as is for now.  She agrees.[name]  LFTs wnl o/w.

## 2018-12-22 NOTE — Assessment & Plan Note (Signed)
No change in meds at this point.  Recheck TSH in about 3 months.  She agrees.

## 2018-12-22 NOTE — Assessment & Plan Note (Addendum)
LDL at goal.  Continue as is.  Continue work on diet and exercise.  She agrees.

## 2018-12-22 NOTE — Assessment & Plan Note (Signed)
History of vitamin D deficiency.  Discussed with patient.  We can recheck labs later on.  See orders.  She agrees.

## 2018-12-23 ENCOUNTER — Other Ambulatory Visit: Payer: Self-pay | Admitting: Family Medicine

## 2018-12-23 DIAGNOSIS — G3184 Mild cognitive impairment, so stated: Secondary | ICD-10-CM

## 2019-01-24 ENCOUNTER — Other Ambulatory Visit: Payer: Self-pay | Admitting: Neurology

## 2019-03-10 ENCOUNTER — Telehealth: Payer: Self-pay | Admitting: Family Medicine

## 2019-03-10 DIAGNOSIS — T753XXA Motion sickness, initial encounter: Secondary | ICD-10-CM

## 2019-03-10 NOTE — Telephone Encounter (Signed)
Has she ever used the scopolamine patches in the past? Has she tried anything OTC for motion sickness like Meclizine or Dramamine?

## 2019-03-10 NOTE — Telephone Encounter (Signed)
Pt recently seen 12/20/18 for with Dr Damita Dunnings  Please advise if able to fill as requested.   Allergies  Allergen Reactions  . Lexapro [Escitalopram Oxalate] Other (See Comments)    Fatigue, sedation  . Meperidine Hcl Other (See Comments)    Cardiac arrest and syncope  . Simvastatin Other (See Comments)    REACTION: muscle aches  . Codeine Itching  . Tramadol Itching

## 2019-03-10 NOTE — Telephone Encounter (Signed)
Patient is going [place] sea fishing and she'd like a rx for a patch to put behind her ear.  Patient uses [facility].

## 2019-03-11 MED ORDER — SCOPOLAMINE 1 MG/3DAYS TD PT72: 1.0000 | MEDICATED_PATCH | TRANSDERMAL | 0 refills | Status: DC

## 2019-03-11 NOTE — Telephone Encounter (Signed)
Noted. Please notify patient that I sent a prescription for the patches to her pharmacy. Apply to a hairless area (usually behind the ear) about 4 hours prior to her trip and then change every three days.

## 2019-03-11 NOTE — Telephone Encounter (Signed)
Left message on answering machine for patient to call back.  

## 2019-03-11 NOTE — Telephone Encounter (Signed)
Pt reports she has had to use them in the past for deep sea fishing and states the OTC Dramamine did not work

## 2019-03-14 NOTE — Telephone Encounter (Signed)
Patient advised and has picked up her prescription already

## 2019-04-07 ENCOUNTER — Encounter: Payer: 59 | Admitting: Psychology

## 2019-04-07 ENCOUNTER — Other Ambulatory Visit: Payer: Self-pay

## 2019-04-07 ENCOUNTER — Encounter: Payer: Self-pay | Admitting: Psychology

## 2019-04-07 ENCOUNTER — Ambulatory Visit: Payer: 59 | Admitting: Psychology

## 2019-04-07 ENCOUNTER — Encounter: Payer: 59 | Attending: Psychology | Admitting: Psychology

## 2019-04-07 DIAGNOSIS — R29898 Other symptoms and signs involving the musculoskeletal system: Secondary | ICD-10-CM

## 2019-04-07 DIAGNOSIS — R413 Other amnesia: Secondary | ICD-10-CM | POA: Diagnosis not present

## 2019-04-07 DIAGNOSIS — R251 Tremor, unspecified: Secondary | ICD-10-CM

## 2019-04-07 NOTE — Progress Notes (Signed)
Neuropsychological Consultation   Patient:   Traci [name]   DOB:   10-19-1955  MR Number:  [id number]  [facility]:  [facility] RE[facility] [address] [id number] [facility] [place] [id number] Dept: [phone]           Date of Service:   04/07/2019  Start Time:   1 PM End Time:   3 PM  Today was an in person visit and consisted of 1 hour face-to-face clinical interview with the patient as well as 1 hour of records review, report writing and setting up testing protocols.  Provider/Observer:  Arley PhenixJohn Aden Sek, Psy.D.       Clinical Neuropsychologist       Billing Code/Service: [id number]  Chief Complaint:    Traci [name] is a 63 year old female referred by [name], MD who is her PCP.  The patient is describing increasing issues with memory and word finding difficulties that have been progressively worsening over the past year to year and a half.  Prior to that, the patient has been having increasing fine motor control issues in her hands bilaterally.  The patient reports that this is left her with increasingly poor handwriting issues.  These handwriting deficits have gotten to the point that her job is in jeopardy.  The patient denies any executive functioning deficits.  The patient also describes word finding difficulties developing and that others are noticing it.  The patient denies any geographic disorientation or changes in visual-spatial abilities.  Reason for Service:  Traci [name] is a 63 year old female referred by [name], MD who is her PCP.  The patient is describing increasing issues with memory and word finding difficulties that have been progressively worsening over the past year to year and a half.  Prior to that, the patient has been having increasing fine motor control issues in her hands bilaterally.  The patient reports that this is left her with  increasingly poor handwriting issues.  These handwriting deficits have gotten to the point that her job is in jeopardy.  The patient denies any executive functioning deficits.  The patient also describes word finding difficulties developing and that others are noticing it.  The patient denies any geographic disorientation or changes in visual-spatial abilities.  The patient reports that she has had increasing difficulty with both her arms and [facility].  They have not been able to identify the etiological factors for her increasing fine motor deficits.  The patient reports that while her handwriting is quite arduous and she will get very tired quickly and her arms will go numb after riding for just a short period of time she does fine when she is typing.  The patient reports that she is not having difficulty coordinating her fingers enough to be able to type effectively.  The patient describes significant pain in her arms as well.  The patient reports it is difficult for her to sleep at times and at least 1 night per week that she will be unable to sleep at all.  Other times she will fall asleep within an hour to an hour and a half and be able to sleep throughout the night.  The patient does describe issues with significant snoring that is been told to her by others and reports that she will at times wake herself up feel like she is going to choke.  She has not had any previous  sleep studies to assess for obstructive sleep apnea.  The patient does have a history of depression at times but denies any significant depressive symptoms at this time.  The patient has had previous brain MRIs.  The patient had an MRI of her brain in 2002 after concerns that she may have had a cerebrovascular accident.  She had developed dizziness, discoordination, slurred speech and left facial droop.  The MRI in 2002 did not find any evidence of acute or subacute CVA.  The patient again had an MRI in her  brain in 2015.  While again there were some issues with postural head shape with mild flattening of the left occipital skull that was felt to be likely developmental in nature.  There was no evidence of acute infarct, intracranial hemorrhage, mass-effect or midline shift at that time either.  There was no significant white matter disease or signs of microvascular ischemia.  There was also no note or indication of old infarcts.  The patient reports that there is a family history of neurological disorder with her mother being diagnosed with dementia that was considered to be Alzheimer's at the time.  Current Status:  The patient describes initially developing fine motor control and handwriting issues bilaterally as well as significant pain in her arms as well.  The patient describes increasing issues with memory retrieval and recall that have been progressing over the past year to year and a half.  She also reports more word finding issues over this timeframe as well.  The patient reports that the pain in her arms has created some significant sleep disturbance as well.  Reliability of Information: Information is derived from 1 hour face-to-face/in person clinical interview as well as review of available medical records.  Behavioral Observation: Traci [name]  presents as a 63 y.o.-year-old Left Caucasian Female who appeared her stated age. her dress was Appropriate and she was Well Groomed and her manners were Appropriate to the situation.  her participation was indicative of Appropriate and Attentive behaviors.  There were not any physical disabilities noted other than clear indications of fine motor control when reviewing her handwriting and the forms that she filled out.  she displayed an appropriate level of cooperation and motivation.     Interactions:    Active Appropriate and Attentive  Attention:   within normal limits and attention span and concentration were age  appropriate  Memory:   within normal limits; recent and remote memory intact  Visuo-spatial:  not examined  Speech (Volume):  normal  Speech:   normal; normal  Thought Process:  Coherent and Relevant  Though Content:  WNL; not suicidal and not homicidal  Orientation:   person, place, time/date and situation  Judgment:   Good  Planning:   Good  Affect:    Appropriate  Mood:    Euthymic  Insight:   Good  Intelligence:   high  Marital Status/Living: The patient was born and raised in [place] with 2 siblings.  The patient currently lives with a roommate and her grandson.  They have been living in this situation for the past 1 year.  The patient previously lived with her mother while she was taking care of her mother who had symptoms consistent with Alzheimer's dementia.  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  She has been doing this for many years.  Substance Use:  No concerns of substance abuse are reported.    Education:  College  Medical History:   Past Medical History:  Diagnosis Date  . Abnormal alkaline phosphatase test    with neg eval prev  . Arthritis   . Depression    Previously responded to Wellbutrin  . Headache(784.0)   . Hyperlipidemia   . Hypothyroidism   . Migraines   . Stroke (HCC) 12/2000  . Thyroid disease    Hypothyroidism            Abuse/Trauma History: The patient did not report any history of abuse or trauma.  Psychiatric History:  The patient does have a history of depressive events in the past.  Family Med/Psych History:  Family History  Problem Relation Age of Onset  . Stroke Mother   . Diabetes Mother   . Heart attack Father        Deceased, 6856  . Cancer Other        Ovarian, Uterine  . Heart disease Other   . Melanoma Sister   . Healthy Brother   . Colon cancer Neg Hx   . Breast cancer Neg Hx     Risk of Suicide/Violence: virtually  non-existent the patient denies any suicidal or homicidal ideation.  Impression/DX:  Traci [name] is a 63 year old female referred by [name], MD who is her PCP.  The patient is describing increasing issues with memory and word finding difficulties that have been progressively worsening over the past year to year and a half.  Prior to that, the patient has been having increasing fine motor control issues in her hands bilaterally.  The patient reports that this is left her with increasingly poor handwriting issues.  These handwriting deficits have gotten to the point that her job is in jeopardy.  The patient denies any executive functioning deficits.  The patient also describes word finding difficulties developing and that others are noticing it.  The patient denies any geographic disorientation or changes in visual-spatial abilities.  The patient describes initially developing fine motor control and handwriting issues bilaterally as well as significant pain in her arms as well.  The patient describes increasing issues with memory retrieval and recall that have been progressing over the past year to year and a half.  She also reports more word finding issues over this timeframe as well.  The patient reports that the pain in her arms has created some significant sleep disturbance as well.   Disposition/Plan:  We have set the patient up for formal neuropsychological testing and we will administer the Wechsler Adult Intelligence Scale, the Wechsler Memory Scale, the grooved pegboard test, the finger tapping test, and the Trail Making Test composite measures.  Diagnosis:   Memory loss of unknown cause, tremor, fine motor control deficits.          Electronically Signed   _______________________ Arley PhenixJohn Geraldine Tesar, Psy.D.

## 2019-04-11 ENCOUNTER — Other Ambulatory Visit: Payer: Self-pay

## 2019-04-11 ENCOUNTER — Encounter: Payer: 59 | Admitting: Psychology

## 2019-04-11 ENCOUNTER — Encounter: Payer: Self-pay | Admitting: Psychology

## 2019-04-11 DIAGNOSIS — R413 Other amnesia: Secondary | ICD-10-CM

## 2019-04-11 DIAGNOSIS — R251 Tremor, unspecified: Secondary | ICD-10-CM | POA: Diagnosis not present

## 2019-04-11 NOTE — Progress Notes (Addendum)
The patient arrived on time to her 8:00 testing appointment. The evaluation lasted 240 minutes.  Mental Status & Behavioral Observations:  Appearance: Casually dressed, appropriately groomed.  Gait: Ambulated independently  Speech:  Clear, normal rate, tone, & volume Thought process: Linear and mostly organized, somewhat concrete.  Mood & Affect: Mildly anxious & depressed, reduced frustration tolerance noted. Interpersonal: Pleasant, appropriate Orientation: Mostly oriented (e.g. correct year and day of month/week, but stated month as July instead of August; able to name current president)  Tests Administered:  [employer]   Grooved Pegboard  Trail Making Test (A&B)  Wechsler Adult Intelligence Scale-Fourth Edition (WAIS-IV)  Wechsler Memory Scale-Fourth Edition (WMS-IV) Adult Version (ages 54-69)  Results:    Boston Naming Test (BNT) . Total=55/60, T=42, 21%  Finger Tapping Test (FTT) . Dominant Hand (Lt.)  o Total Taps=37, T=39, 14%  . Non-Dominant Hand o Total Taps=37", T=43, 25%  Grooved Pegboard . Dominant Hand (Lt.)  o Time=75", T=41, 18% . Non-Dominant Hand o Time=101", T=38, 12%  Trail Making Test  . Part A o Time=27", T=53, 62% . Part B o Time=78", T=45, 31%  WAIS-IV Composite Score Summary  Scale Sum of Scaled Scores Composite Score Percentile Rank 95% Conf. Interval Qualitative Description  Verbal Comprehension 25 VCI 91 27 86-97 Average  Perceptual Reasoning 23 PRI 86 18 80-93 Low Average  Working Memory 26 WMI 117 87 109-123 High Average  Processing Speed 18 PSI 94 34 86-103 Average  Full Scale 92 FSIQ 94 34 90-98 Average  General Ability 48 GAI 87 19 82-92 Low Average   Index Level Discrepancy Comparisons  Comparison Score 1 Score 2 Difference Critical Value .05 Significant Difference Y/N Base Rate by Overall Sample  VCI - PRI 91 86 5 8.31 N 36.3  VCI - WMI 91 117 -26 8.82 Y 2.4  VCI - PSI 91 94 -3 10.19  N 44.7  PRI - WMI 86 117 -31 9.74 Y 1.1  PRI - PSI 86 94 -8 11.00 N 31.4  WMI - PSI 117 94 23 11.38 Y 7.3  FSIQ - GAI 94 87 7 3.51 Y 8.0   Differences Between Subtest and Overall Mean of Subtest Scores  Subtest Subtest Scaled Score Mean Scaled Score Difference Critical Value .05 Strength or Weakness Base Rate  Block Design 8 9.20 -1.20 2.85  >25%  Similarities 8 9.20 -1.20 2.82  >25%  Digit Span 14 9.20 4.80 2.22 S 2%  Matrix Reasoning 6 9.20 -3.20 2.54 W 10-15%  Arithmetic 12 9.20 2.80 2.73 S 15%   Verbal Comprehension Subtests Summary  Subtest Raw Score Scaled Score Percentile Rank Reference Group Scaled Score SEM  Similarities 20 8 25 7  1.08  Vocabulary 33 9 37 9 0.73  Information 11 8 25 9  0.67   Perceptual Reasoning Subtests Summary  Subtest Raw Score Scaled Score Percentile Rank Reference Group Scaled Score SEM  Block Design 30 8 25 7  1.04  Matrix Reasoning 9 6 9 4  0.95  Visual Puzzles 11 9 37 7 0.99   Working Doctor, general practice Raw Score Scaled Score Percentile Rank Reference Group Scaled Score SEM  Digit Span 34 14 91 13 0.85  Arithmetic 17 12 75 12 1.04   Processing Speed Subtests Summary  Subtest Raw Score Scaled Score Percentile Rank Reference Group Scaled Score SEM  Symbol Search 28 10 50 8 1.31  Coding 52 8 25 6  0.99  (Cancellation) 39 11 63 9 1.34  Subtest Level Discrepancy Comparisons  Subtest Comparison Score 1 Score 2 Difference Critical Value .05 Significant Difference Y/N Base Rate  Digit Span - Arithmetic 14 12 2  2.57 N 29.00  Symbol Search - Coding 10 8 2  3.41 N 27.40          WAIS-IV-Adult Index Score Summary  Index Sum of Scaled Scores Index Score Percentile Rank 95% Confidence Interval Qualitative Descriptor  Auditory Memory (AMI) 42 102 55 96-108 Average  Visual Working Memory (VWMI) 17 91 27 84-99 Average   Primary Subtest Scaled Score Summary  Subtest Domain Raw Score Scaled Score Percentile Rank  Logical Memory  I AM 37 14 91  Logical Memory II AM 30 13 84  Verbal Paired Associates I AM 23 8 25   Verbal [employer] II AM 6 7 16   Designs I VM 65 10 50  Designs II VM 52 10 50  Spatial Addition VWM 9 8 25   Symbol Span VWM 20 9 37    Visual Memory Process Score Summary  Process Score Raw Score Scaled Score Percentile Rank Cumulative Percentage (Base Rate)  VR II Recognition 5 - - 26-50%  LM II Recognition 28 - - >75%  VPA II Recognition 38 - - 26-50%   Visual Memory Process Score Summary  Process Score Raw Score Scaled Score Percentile Rank Cumulative Percentage (Base Rate)  DE I Content 30 8 25  -  DE I Spatial 17 11 63 -  DE II Content 34 11 63 -  DE II Spatial 10 9 37 -  DE II Recognition 16 - - 51-75%   ABILITY-MEMORY ANALYSIS  Ability Score:  GAI: 87 Date of Testing:  WAIS-IV 2019/04/11; WMS-IV 2019/04/11  Predicted Difference Method   Index Predicted WMS-IV Index Score Actual WMS-IV Index Score Difference Critical Value  Significant Difference Y/N Base Rate  Auditory Memory 93 102 -9 8.95 Y 25%  Visual Working Memory 91 91 0 11.24 N   Statistical significance (critical value) at the .01 level.   Contrast Scaled Scores  Score Score 1 Score 2 Contrast Scaled Score  General Ability Index vs. Auditory Memory Index 87 102 12  General Ability Index vs. Visual Working Memory Index 87 91 10  Verbal Comprehension Index vs. Auditory Memory Index 91 102 11  Perceptual Reasoning Index vs. Visual Working Memory Index 86 91 10  Working Memory Index vs. Auditory Memory Index 117 102 9  Working Memory Index vs. Visual Working Memory Index 117 91 5

## 2019-04-20 ENCOUNTER — Other Ambulatory Visit: Payer: Self-pay | Admitting: Cardiovascular Disease

## 2019-04-21 ENCOUNTER — Other Ambulatory Visit: Payer: Self-pay | Admitting: Family Medicine

## 2019-04-21 DIAGNOSIS — Z1231 Encounter for screening mammogram for malignant neoplasm of breast: Secondary | ICD-10-CM

## 2019-05-23 ENCOUNTER — Telehealth: Payer: Self-pay

## 2019-05-23 NOTE — Telephone Encounter (Signed)
Left detailed message on voicemail. DPR 

## 2019-05-23 NOTE — Telephone Encounter (Signed)
Agreed.  Please advise patient to drink plenty of water and avoid carbs in the meantime.  Thanks.

## 2019-05-23 NOTE — Telephone Encounter (Signed)
Pt said she has hx in her family of diabetes; pt said for 2 wks on and off when eats sweets pt feels lightheaded and blurred vision or a cloud over her eyes; after pt sits for a while symptoms gets better until goes away. No CP,H/A,SOB and no unusual weakness in arms or legs; pt said seeing neurologist about her arm. Pt has no covid symptoms, no travel and no known exposure to + covid. UC or ED precautions given. Pt request early morning appt. Pt scheduled in office 30' appt with Dr Damita Dunnings on 05/27/19 at 8 AM; pt will be in office at 7:45 AM. Pt will come fasting for labs if needed. Pt will try to cutback on eating and drinking sweets. FYI to Dr Damita Dunnings.

## 2019-05-27 ENCOUNTER — Other Ambulatory Visit: Payer: Self-pay

## 2019-05-27 ENCOUNTER — Encounter: Payer: Self-pay | Admitting: Family Medicine

## 2019-05-27 ENCOUNTER — Ambulatory Visit: Payer: 59 | Admitting: Family Medicine

## 2019-05-27 VITALS — BP 130/84 | HR 88 | Temp 97.9°F | Ht 65.5 in | Wt 203.6 lb

## 2019-05-27 DIAGNOSIS — E559 Vitamin D deficiency, unspecified: Secondary | ICD-10-CM

## 2019-05-27 DIAGNOSIS — E039 Hypothyroidism, unspecified: Secondary | ICD-10-CM | POA: Diagnosis not present

## 2019-05-27 DIAGNOSIS — R739 Hyperglycemia, unspecified: Secondary | ICD-10-CM | POA: Diagnosis not present

## 2019-05-27 DIAGNOSIS — R42 Dizziness and giddiness: Secondary | ICD-10-CM

## 2019-05-27 DIAGNOSIS — R29898 Other symptoms and signs involving the musculoskeletal system: Secondary | ICD-10-CM

## 2019-05-27 LAB — COMPREHENSIVE METABOLIC PANEL
ALT: 13 U/L (ref 0–35)
AST: 17 U/L (ref 0–37)
Albumin: 4.2 g/dL (ref 3.5–5.2)
Alkaline Phosphatase: 129 U/L — ABNORMAL HIGH (ref 39–117)
BUN: 16 mg/dL (ref 6–23)
CO2: 29 mEq/L (ref 19–32)
Calcium: 9.5 mg/dL (ref 8.4–10.5)
Chloride: 105 mEq/L (ref 96–112)
Creatinine, Ser: 0.89 mg/dL (ref 0.40–1.20)
GFR: 63.72 mL/min (ref >60.00)
Glucose, Bld: 96 mg/dL (ref 70–99)
Potassium: 4.4 mEq/L (ref 3.5–5.1)
Sodium: 140 mEq/L (ref 135–145)
Total Bilirubin: 0.5 mg/dL (ref 0.2–1.2)
Total Protein: 6.7 g/dL (ref 6.0–8.3)

## 2019-05-27 LAB — CBC WITH DIFFERENTIAL/PLATELET
Basophils Absolute: 0 10*3/uL (ref 0.0–0.1)
Basophils Relative: 0.7 % (ref 0.0–3.0)
Eosinophils Absolute: 0.1 10*3/uL (ref 0.0–0.7)
Eosinophils Relative: 2.2 % (ref 0.0–5.0)
HCT: 37.1 % (ref 36.0–46.0)
Hemoglobin: 12.4 g/dL (ref 12.0–15.0)
Lymphocytes Relative: 20.1 % (ref 12.0–46.0)
Lymphs Abs: 1.1 10*3/uL (ref 0.7–4.0)
MCHC: 33.4 g/dL (ref 30.0–36.0)
MCV: 89.3 fl (ref 78.0–100.0)
Monocytes Absolute: 0.6 10*3/uL (ref 0.1–1.0)
Monocytes Relative: 11 % (ref 3.0–12.0)
Neutro Abs: 3.7 10*3/uL (ref 1.4–7.7)
Neutrophils Relative %: 66 % (ref 43.0–77.0)
Platelets: 229 10*3/uL (ref 150.0–400.0)
RBC: 4.16 Mil/uL (ref 3.87–5.11)
RDW: 14.2 % (ref 11.5–15.5)
WBC: 5.6 10*3/uL (ref 4.0–10.5)

## 2019-05-27 LAB — HEMOGLOBIN A1C: Hgb A1c MFr Bld: 5 % (ref 4.6–6.5)

## 2019-05-27 LAB — VITAMIN B12: Vitamin B-12: 1053 pg/mL — ABNORMAL HIGH (ref 211–911)

## 2019-05-27 LAB — POCT CBG (FASTING - GLUCOSE)-MANUAL ENTRY: Glucose Fasting, POC: 111 mg/dL — AB (ref 70–99)

## 2019-05-27 LAB — TSH: TSH: 1.78 u[IU]/mL (ref 0.35–4.50)

## 2019-05-27 LAB — VITAMIN D 25 HYDROXY (VIT D DEFICIENCY, FRACTURES): VITD: 44.06 ng/mL (ref 30.00–100.00)

## 2019-05-27 NOTE — Progress Notes (Signed)
She has been more tearful, no SI/HI.  Her mood is lower.  Still on wellbutrin.  Stressors from 2020 d/w pt, pandemic d/w pt.  We agreed to check her labs first prior to any consideration of med change.   She had been episodically lightheaded over the last month.  Higher sugar foods ---> lightheaded, blurry vision.  This was while she was eating higher carb foods.  She cut back on carbs in the meantime.  She had two times at night when she didn't feel well but responded to eating a candy.  Some fatigue.  She is thirsty.  She hasn't eaten this AM.    No FCNAVD.  No CP, SOB.    Meds, vitals, and allergies reviewed.   ROS: Per HPI unless specifically indicated in ROS section   GEN: nad, alert and oriented HEENT: ncat NECK: supple w/o LA, not ttp.  CV: rrr. PULM: ctab, no inc wob ABD: soft, +bs EXT: no edema SKIN: well perfused.

## 2019-05-27 NOTE — Patient Instructions (Signed)
Go to the lab on the way out.  We'll contact you with your lab report. Avoid high sugar foods and drink plenty of water in the meantime.   Take care.  Glad to see you.

## 2019-05-28 DIAGNOSIS — R739 Hyperglycemia, unspecified: Secondary | ICD-10-CM | POA: Insufficient documentation

## 2019-05-28 NOTE — Assessment & Plan Note (Signed)
See notes on labs.  She has symptoms suggestive of diabetes.  Sugar was not severely elevated here but was not entirely normal either, discussed.  Continue liberal water intake, avoid high carbohydrate foods, see notes on labs.  At this point still okay for outpatient follow-up.  She agrees.

## 2019-06-03 ENCOUNTER — Ambulatory Visit
Admission: RE | Admit: 2019-06-03 | Discharge: 2019-06-03 | Disposition: A | Discharge status: Home / Self Care | Payer: 59 | Source: Ambulatory Visit | Attending: Family Medicine | Admitting: Family Medicine

## 2019-06-03 ENCOUNTER — Other Ambulatory Visit: Payer: Self-pay

## 2019-06-03 DIAGNOSIS — Z1231 Encounter for screening mammogram for malignant neoplasm of breast: Secondary | ICD-10-CM

## 2019-06-16 ENCOUNTER — Encounter: Payer: Self-pay | Admitting: Psychology

## 2019-06-16 ENCOUNTER — Encounter: Payer: 59 | Attending: Psychology | Admitting: Psychology

## 2019-06-16 ENCOUNTER — Other Ambulatory Visit: Payer: Self-pay

## 2019-06-16 DIAGNOSIS — I6990 Unspecified sequelae of unspecified cerebrovascular disease: Secondary | ICD-10-CM | POA: Diagnosis not present

## 2019-06-16 DIAGNOSIS — R29898 Other symptoms and signs involving the musculoskeletal system: Secondary | ICD-10-CM | POA: Insufficient documentation

## 2019-06-16 DIAGNOSIS — R413 Other amnesia: Secondary | ICD-10-CM | POA: Diagnosis not present

## 2019-06-16 DIAGNOSIS — R251 Tremor, unspecified: Secondary | ICD-10-CM | POA: Diagnosis not present

## 2019-06-16 NOTE — Progress Notes (Addendum)
Neuropsychological Evaluation     Patient:  Traci [name]   DOB: May 10, 1956  MR Number: [id number]  [facility] [address] [id number] [facility] [place] [id number] Dept: [phone]  Start: 4 PM End: 5 PM  Provider/Observer:     [name]  Chief Complaint:      Chief Complaint  Patient presents with   Memory Loss   Other    Fine motor control issues    Reason For Service:     Traci [name] is a 63 year old female referred by Traci GivensGraham Duncan, MD who is her PCP.  The patient describes increasing issues with memory and word finding difficulties that have been progressively worsening over the past year to year and a half.  Prior to that, the patient has been having increasing fine motor control issues in her hands bilaterally.  The patient reports that this has left her with increasingly poor handwriting.  These handwriting deficits have gotten to the point that her job is in jeopardy.  The patient denies any executive functioning deficits.  The patient also describes word finding difficulties developing and that others are noticing it.  The patient denies any geographic disorientation or changes in visual-spatial abilities.  The patient reports that she has had increasing difficulty with both her arms and saw Traci [name] neurology  for review.  They have not been able to identify the etiological factors for her increasing fine motor deficits.  The patient reports that while her handwriting is quite arduous and she will get very tired quickly and her arms will go numb after writing for just a short period of time, she does fine when she is typing.  The patient reports that she is not having difficulty coordinating her fingers enough to be able to type effectively.  The patient describes significant pain in her arms as well.  The patient reports it is  difficult for her to sleep at times and at least 1 night per week that she will be unable to sleep at all.  Other times she will fall asleep within an hour to an hour and a half and be able to sleep throughout the night.  The patient does describe issues with significant snoring that is been told to her by others and reports that she will at times wake herself up feel like she is going to choke.  She has not had any previous sleep studies to assess for obstructive sleep apnea.  The patient does have a history of depression at times but denies any significant depressive symptoms at this time.  The patient has had previous brain MRIs.  The patient had an MRI of her brain in 2002 after concerns that she may have had a cerebrovascular accident.  She had developed dizziness, discoordination, slurred speech and left facial droop.  The MRI in 2002 did not find any evidence of acute or subacute CVA.  The patient again had an MRI in her brain in 2015.  While again there were some issues with postural head shape with mild flattening of the left occipital skull that was felt to be likely developmental in nature.  There was no evidence of acute infarct, intracranial hemorrhage, mass-effect or midline shift at that time either.  There was no significant white matter disease or signs of microvascular ischemia.  There was also no note or indication of old infarcts.  The patient is seen  both Dr. Carles [name] as well as Dr. Posey [name] with [name].  The patient was complaining about abnormal handwriting at the time.  The patient describes an event in 2002 where she was at work and started to have sudden onset of slurred speech.  She had driven herself to go for college and was told that her face was twisted.  While the patient was told that she likely had a stroke/TIA her MRI was normal.  She did have some stenosis of the A1 segment of her left MCA.  The patient does have a history of migraine.  No  etiological factors were noted.  The patient did continue to have some left facial droop when the muscles of the facial expression are not activated but when she smiled at the time they were equal.  It was felt that her motor symptoms in her face had a pseudobulbar quality to them.  She was then followed up by Dr. Posey [name] for further neurological assessment.  There have been some abnormalities in the MRI of her cervical spine showing some radiculopathy affecting bilateral upper extremities at C8 as well as some chronic L3-L4 radiculopathy affecting lower extremities.  The patient also had some mild cognitive impairments noted on her M[place] test particularly with delayed recall.  The patient reports that there is a family history of neurological disorder with her mother being diagnosed with dementia that was considered to be Alzheimer's at the time.  Mental Status & Behavioral Observations: Appearance:Casually dressed, appropriately groomed.  Gait:Ambulated independently  Speech: Clear, normal rate, tone, & volume Thought process:Linear and mostly organized, somewhat concrete.  Mood & Affect: Mildly anxious & depressed, reduced frustration tolerance noted. Interpersonal: Pleasant, appropriate Orientation: Mostly oriented (e.g. correct year and day of month/week, but stated month as July instead of August; able to name current president)  Tests Administered:  [employer]   Grooved Pegboard  Trail Making Test (A&B)  Wechsler Adult Intelligence Scale-Fourth Edition (WAIS-IV)  Wechsler Memory Scale-Fourth Edition (WMS-IV) Adult Version (ages 8-69)    Test Results:   Initially, we looked at the patient's educational and occupational history to produce an estimation of the patient's historical/premorbid cognitive functioning.  Given these variables the patient likely has had a global intellectual and cognitive functioning range in the high average range  relative to a normative population with her likely historical performance falling somewhere between 105 and 115 standard T score.   Boston Naming Test (BNT)  Total=55/60, T=42, 21%  The patient was administered the [place] which assesses challenge/targeted naming and the patient correctly identified 55-60 targets which falls at the 21st percentile relative to a normative population.  This is in the lower end of the average range.  During the clinical interview the patient was able to effectively show good verbal fluency without paraphasic errors or circumlocutions.  Finger Tapping Test (FTT)  Dominant Hand (Lt.)  ? Total Taps=37, T=39, 14%   Non-Dominant Hand ? Total Taps=37, T=43, 25%  Grooved Pegboard  Dominant Hand (Lt.)  ? Time=75, T=41, 18%  Non-Dominant Hand ? Time=101, T=38, 12%  The patient was also administered the finger tapping test as well as the grooved pegboard test to assess for issues related to manual dexterity as well as fine motor speed.  The patient showed only mild weaknesses with regard to fine motor speed and manual dexterity and showed no indication of lateralized deficits but more mild difficulties with manual dexterity and motor  speed.  WAIS-IV Composite Score Summary   Scale Sum of Scaled Scores Composite Score Percentile Rank 95% Conf. Interval Qualitative Description  Verbal Comprehension 25 VCI 91 27 86-97 Average  Perceptual Reasoning 23 PRI 86 18 80-93 Low Average  Working Memory 26 WMI 117 87 109-123 High Average  Processing Speed 18 PSI 94 34 86-103 Average  Full Scale 92 FSIQ 94 34 90-98 Average  General Ability 48 GAI 87 19 82-92 Low Average   The patient was then administered the Wechsler Adult Intelligence Scale-IV.  The patient produced a full scale IQ score of 94 which falls to the 34th percentile and is in the average range.  This is somewhat below predicted levels based on education and occupational history suggesting  some areas of reduced cognitive efficiency.  We also calculated the patient's general abilities index which places less emphasis on information processing speed and working memory as those are some of the most sensitive areas to cognitive change.  However, the patient is working memory was in fact her highest level of functioning on this battery of tests thus producing a general abilities index score of 87 which falls at the 19th percentile and is in the low average range.   Verbal Comprehension Subtests Summary   Subtest Raw Score Scaled Score Percentile Rank Reference Group Scaled Score SEM  Similarities 1.08  Vocabulary 33 9 37 9 0.73  Information 0.67   The patient produced a verbal comprehension index score of 91 which falls at the 27th percentile and is in the average range.  This is below expected levels predicted by educational occupational information.  The patient achieve levels in the lower end of the average range with regard to verbal reasoning and problem-solving, general vocabulary knowledge and her general fund of information.  Perceptual Reasoning Subtests Summary   Subtest Raw Score Scaled Score Percentile Rank Reference Group Scaled Score SEM  Block Design 1.04  Matrix Reasoning 0.95  Visual Puzzles 11 9 37 7 0.99   The patient produced a perceptual reasoning index score of 86 which falls at the 18th percentile and is in the low average range.  Individual subtest making up this measure showed some degree of variability.  For example she functioned in the average to lower end of the average range with regard to her visual analysis and organizational abilities as well as her visual estimation and visual judgment abilities.  The patient showed mild to moderate deficits with regard to visual reasoning and problem-solving abilities.           Working Scientist, research (life sciences) Raw Score Scaled Score Percentile Rank Reference Group  Scaled Score SEM  Digit Span 34 14 91 13 0.85  Arithmetic 17 12 75 12 1.04   The patient produced a working memory index score of 117 which falls at the 87th percentile and is in the high average range of functioning.  This is her best performing area within this global battery of measures.  The patient did very well function at the upper end of the average range to high average range with regard to auditory encoding measures as well as encoding with processing of that initially encoded information.  Her performance ranks between the 75th and the 91st percentile.          Processing Speed Subtests Summary   Subtest Raw Score Scaled Score Percentile Rank Reference Group Scaled  Score SEM  Symbol Search 28 10 50 8 1.31  Coding 52 0.99  (Cancellation) 39 11 63 9 1.34   The patient produced a processing speed index score of 94 which falls in the 34th percentile and is in the average range.  Individual items making up the subtest show some degree of variability in subtest performance.  However, she generally performed in the average range with regard to measures of visual scanning, visual searching and overall speed of mental operations.    Trail Making Test   Part A ? Time=27, T=53, 62%  Part B ? Time=78, T=45, 31% To further assess the patient's focus execute abilities and visual scanning visual searching as well as adding in a component of cognitive shifting and mental flexibility the patient was administered the Trail Making Test part a and B.  On the measure primarily assessing visual scanning and visual searching the patient did quite well performing at the 62nd percentile relative to a normative population.  While still in the average range her performance relatively speaking deteriorated some on measures where there was an added component of shifting attention on these focus execute task.  WMS-IV Index Score Summary  Index Sum of Scaled Scores Index Score Percentile Rank 95%  Confidence Interval Qualitative Descriptor  Auditory Memory (AMI) 42 102 55 96-108 Average  Visual Working Memory (VWMI) 17 91 27 84-99 Average   The patient was then administered parts of the Wechsler Memory Scale scale-IV.  The patient produced an auditory memory index score of 102 which falls at the 55th percentile and is in the average range and generally consistent with predicted levels based on her education and occupational history.  The patient performed somewhat less effectively and efficiently on visual memory task although she still fell in the average range relative to a normative population but somewhat lower than predicted levels based on educational occupational history.  The patient produced a visual working memory index score of 91 which falls at the 27th percentile and is in the average range.        Primary Subtest Scaled Score Summary  Subtest Domain Raw Score Scaled Score Percentile Rank  Logical Memory I AM 37 14 91  Logical Memory II AM 30 13 84  Verbal Paired Associates I AM Verbal Paired Associates II AM Designs I VM 65 10 50  Designs II VM 52 10 50  Spatial Addition VWM Symbol Span VWM 20 9 37    Individual subtest making up these measures show that the patient did quite well on logical memory 1 which is her initial recall of story based information.  She performed in the high average range following at the 91st percentile on this measure.  The patient did well maintaining and retaining that information over period of time/delay and on the delayed recall portion of this measure she produced performances that fall at the 84th percentile.  The patient did not do as well learning list of verbal pairs with both her immediate and delayed performances falling at the lower end of the average range.  She did maintain most of the information that she initially learned after period of delay with no deterioration in delayed recall.  The patient  performed in the lower end of the average range with regard to visual encoding and visual working memory.  While this performance is generally in the average range it is not quite as  effective and efficient as she was on her auditory encoding/auditory working memory.  The patient performed in the average range for visual learning and memory as well as visual learning and memory delayed scores.  These were in the average range.  ABILITY-MEMORY ANALYSIS  Ability Score:    GAI: 87 Date of Testing:           WAIS-IV 2019/04/11; WMS-IV 2019/04/11           Predicted Difference Method   Index Predicted WMS-IV Index Score Actual WMS-IV Index Score Difference Critical Value  Significant Difference Y/N Base Rate  Auditory Memory 93 102 -9 8.95 Y 25%  Visual Working Memory 91 91 0 11.24 N   Statistical significance (critical value) at the .01 level.    To further assess the patient's memory performance we also calculated the abilities-memory analysis.  We utilize the general abilities index score to make an estimation of the patient's performance on various memory task.  This estimated score is then compared with the actual achieve performance.  The patient did better than a predicted score for auditory memory and performed consistently with her visual memory.  Overall, there does not appear to be significant loss in memory and learning abilities although there does appear to be a general lowering of overall memory to a mild degree of her predicted levels.  Summary of Results:   The results of the current neuropsychological evaluation shows some mild issues bilaterally with motor dexterity and motor speed but not severe.  The patient displayed indications of mild decrease in overall verbal comprehension abilities and visual-spatial abilities as well as some mild visual reasoning and problem-solving issues.  However, these were only slightly below predicted levels based on her education and  occupational history.  The patient is information processing speed and cognitive flexibility were generally intact.  The patient did very well on measures of auditory encoding and performed in the average range with regard to visual encoding.  The patient is auditory memory both immediate and delayed were generally consistent with premorbid estimations of functioning and not indicative of any objective memory deficits.  Her visual memory was not quite as efficient as auditory memory but was still in the average range.  There was some mild weakness with regard to targeted naming but this was still at the lower end of the average range without significant indications of deficits.  Impression/Diagnosis:   The results of the current neuropsychological evaluation are not consistent with any significant or progressive cortical or subcortical dementias.  While there is some mild reduction in functions such as fine motor control and dexterity, verbal and visual reasoning and problem-solving, and visual-spatial abilities, her information processing speed, executive functioning, auditory and visual encoding abilities as well as auditory and visual memories functions appear to be within normal limits and are only slightly below or consistent with estimations of premorbid functioning.  The patient began describing memory and cognitive difficulties around the same time she first saw a neurologist regarding significant changes in handwriting.  She has been shown to have some abnormalities in her cervical spine that may be causing some of these motor deficits.  However, patient described an acute event in 2001 that was apparently assumed to be a mild stroke/TIA that included facial droop with this facial droop symptoms continuing to this day when at rest.  It was also at this time that she first noted motor deficits in hand and mild issues with attention and memory.  This is the  event that most likely is responsible for most of  these symptoms.  Given the length of time (19 years) since subjective symptoms were first reported and the level of functioning currently, it is unlikely that these mild cognitive weaknesses represent any significant progressive decline although transient ischemic/mild stroke affecting subcortical regions as well as her cervical spine may be affecting some motor functioning.  The pattern of motor changes and neuropsychological strengths and weaknesses are not consistent with patterns such as Alzheimer's, Lewy body or other degenerative conditions and her MRIs not indicative of any significant microvascular ischemia or other acute changes.  Diagnosis:    Axis I: Memory loss of unknown cause  Poor fine motor skills  Tremor  Late effects of cerebrovascular accident   Arley Phenix, Psy.D. Neuropsychologist

## 2019-06-18 ENCOUNTER — Telehealth: Payer: Self-pay | Admitting: Family Medicine

## 2019-06-18 NOTE — Telephone Encounter (Signed)
I left message for patient to return phone call.   

## 2019-06-18 NOTE — Telephone Encounter (Signed)
Please get update on patient.  Please see if she had noticed any significant changes in her sugar in the meantime.  Please see previous lab results.    Also, I saw the results of her neurocognitive testing.   The results of the current neuropsychological evaluation are not consistent with any significant or progressive cortical or subcortical dementias.  Please see how she is doing, please get update on patient.  Thanks.

## 2019-06-23 NOTE — Telephone Encounter (Signed)
Patient says her blood sugars have been in the 120 to 130 range, no significant changes.  Concerning the neurocognitive testing, patient says she has noticed forgetting things more than usual but mostly her problem is that her arms hurt and she cannot write any longer.  Patient also asks if her depression med could be changed.  She states she has been on it a long time and she feels it is not working so well.  She cries at any little thing, happy or sad.

## 2019-06-24 NOTE — Telephone Encounter (Signed)
I want her to check with neurology about the handwriting changes.  Please let her know I am trying to consider options about her antidepressant medication in the meantime and will be in touch.  Thanks.

## 2019-06-24 NOTE — Telephone Encounter (Signed)
Patient advised.

## 2019-06-25 MED ORDER — VENLAFAXINE HCL ER 37.5 MG PO CP24: 37.5000 mg | ORAL_CAPSULE | Freq: Every day | ORAL | 0 refills | Status: DC

## 2019-06-25 NOTE — Addendum Note (Signed)
Addended by: Tonia Ghent on: 06/25/2019 02:37 PM   Modules accepted: Orders

## 2019-06-25 NOTE — Telephone Encounter (Signed)
I would try adding on venlafaxine 37.5 mg a day.  I sent the prescription.  I would not change her other medications.  See if she can tolerate that addition and see if it helps her mood.  I would like an update from her in about 10 days, sooner if needed.  Thanks.

## 2019-06-26 NOTE — Telephone Encounter (Signed)
Patient advised.

## 2019-07-02 ENCOUNTER — Encounter: Payer: Self-pay | Admitting: Family Medicine

## 2019-07-02 ENCOUNTER — Other Ambulatory Visit: Payer: Self-pay | Admitting: Family Medicine

## 2019-07-03 ENCOUNTER — Encounter: Payer: Self-pay | Admitting: Psychology

## 2019-07-03 ENCOUNTER — Other Ambulatory Visit: Payer: Self-pay

## 2019-07-03 ENCOUNTER — Ambulatory Visit: Payer: 59 | Admitting: Psychology

## 2019-07-03 ENCOUNTER — Encounter: Payer: 59 | Admitting: Psychology

## 2019-07-03 DIAGNOSIS — I6990 Unspecified sequelae of unspecified cerebrovascular disease: Secondary | ICD-10-CM | POA: Diagnosis not present

## 2019-07-03 DIAGNOSIS — R413 Other amnesia: Secondary | ICD-10-CM | POA: Diagnosis not present

## 2019-07-03 DIAGNOSIS — R29898 Other symptoms and signs involving the musculoskeletal system: Secondary | ICD-10-CM | POA: Diagnosis not present

## 2019-07-03 DIAGNOSIS — R251 Tremor, unspecified: Secondary | ICD-10-CM

## 2019-07-03 NOTE — Progress Notes (Signed)
Today I provided feedback regarding the results of the recent neuropsychological evaluation.  This appointment was an in person visit that was conducted with the patient myself in my outpatient clinic office.  The duration of the visit was 1 hour.  Below you will find a copy of the summary and impressions/diagnoses from this evaluation.  The complete neuropsychological report can be found dated 06/16/2019.   Summary of Results:                        The results of the current neuropsychological evaluation shows some mild issues bilaterally with motor dexterity and motor speed but not severe.  The patient displayed indications of mild decrease in overall verbal comprehension abilities and visual-spatial abilities as well as some mild visual reasoning and problem-solving issues.  However, these were only slightly below predicted levels based on her education and occupational history.  The patient is information processing speed and cognitive flexibility were generally intact.  The patient did very well on measures of auditory encoding and performed in the average range with regard to visual encoding.  The patient is auditory memory both immediate and delayed were generally consistent with premorbid estimations of functioning and not indicative of any objective memory deficits.  Her visual memory was not quite as efficient as auditory memory but was still in the average range.  There was some mild weakness with regard to targeted naming but this was still at the lower end of the average range without significant indications of deficits.  Impression/Diagnosis:                     The results of the current neuropsychological evaluation are not consistent with any significant or progressive cortical or subcortical dementias.  While there is some mild reduction in functions such as fine motor control and dexterity, verbal and visual reasoning and problem-solving, and visual-spatial abilities, her information  processing speed, executive functioning, auditory and visual encoding abilities as well as auditory and visual memories functions appear to be within normal limits and are only slightly below or consistent with estimations of premorbid functioning.  The patient began describing memory and cognitive difficulties around the same time she first saw a neurologist regarding significant changes in handwriting.  She has been shown to have some abnormalities in her cervical spine that may be causing some of these motor deficits.  However, patient described an acute event in 2001 that was apparently assumed to be a mild stroke/TIA that included facial droop with this facial droop symptoms continuing to this day when at rest.  It was also at this time that she first noted motor deficits in hand and mild issues with attention and memory.  This is the event that most likely is responsible for most of these symptoms.  Given the length of time (19 years) since subjective symptoms were first reported and the level of functioning currently, it is unlikely that these mild cognitive weaknesses represent any significant progressive decline although transient ischemic/mild stroke affecting subcortical regions as well as her cervical spine may be affecting some motor functioning.  The pattern of motor changes and neuropsychological strengths and weaknesses are not consistent with patterns such as Alzheimer's, Lewy body or other degenerative conditions and her MRIs not indicative of any significant microvascular ischemia or other acute changes.  Diagnosis:  Axis I: Memory loss of unknown cause  Poor fine motor skills  Tremor  Late effects of cerebrovascular accident   Ilean Skill, Psy.D. Neuropsychologist

## 2019-07-11 ENCOUNTER — Ambulatory Visit: Payer: 59 | Admitting: Psychology

## 2019-07-14 ENCOUNTER — Other Ambulatory Visit: Payer: Self-pay

## 2019-07-14 ENCOUNTER — Encounter: Payer: Self-pay | Admitting: Family Medicine

## 2019-07-14 ENCOUNTER — Encounter: Payer: Self-pay | Admitting: Cardiovascular Disease

## 2019-07-14 ENCOUNTER — Ambulatory Visit: Payer: 59 | Admitting: Cardiovascular Disease

## 2019-07-14 VITALS — BP 128/80 | HR 87 | Temp 97.2°F | Ht 65.0 in | Wt 203.0 lb

## 2019-07-14 DIAGNOSIS — I10 Essential (primary) hypertension: Secondary | ICD-10-CM

## 2019-07-14 DIAGNOSIS — I2510 Atherosclerotic heart disease of native coronary artery without angina pectoris: Secondary | ICD-10-CM

## 2019-07-14 DIAGNOSIS — E7800 Pure hypercholesterolemia, unspecified: Secondary | ICD-10-CM | POA: Diagnosis not present

## 2019-07-14 NOTE — Patient Instructions (Addendum)
Medication Instructions:  Your physician recommends that you continue on your current medications as directed. Please refer to the Current Medication list given to you today.  *If you need a refill on your cardiac medications before your next appointment, please call your pharmacy*  Lab Work: NONE  Testing/Procedures: NONE  Follow-Up: At [facility], you and your health needs are our priority.  As part of our continuing mission to provide you with exceptional heart care, we have created designated Provider Care Teams.  These Care Teams include your primary Cardiologist (physician) and Advanced Practice Providers (APPs -  Physician Assistants and Nurse Practitioners) who all work together to provide you with the care you need, when you need it.  Your next appointment:   12 month(s)  You will receive a reminder letter in the mail two months in advance. If you don't receive a letter, please call our office to schedule the follow-up appointment.  The format for your next appointment:   Either In Person or Virtual  Provider:   You may see Tiffany Babbitt, MD or one of the following Advanced Practice Providers on your designated Care Team:    Luke Kilroy, PA-C  Callie Goodrich, PA-C  Jesse Cleaver, FNP    

## 2019-07-14 NOTE — Progress Notes (Signed)
Cardiology Office Note   Date:  07/14/2019   ID:  [name], [name] [birth year], MRN [id number]  PCP:  Tonia Ghent, MD  Cardiologist:   Skeet Latch, MD   Chief Complaint  Patient presents with  . Follow-up    6 months.  . Headache    Yesterday morning.    History of Present Illness: Traci [name] is a 63 y.o. female with hypertension, hyperlipidemia, prior stroke, fibromyalgia, s/p gastric band, and hypothyroidism who presents for follow up.  Traci [name] was admitted to the hospital 11/21/16 with three weeks of intermittent chest pain.  Cardiac enzymes were negative.  She underwent LHC 11/22/16 and had mild luminal irregularities but no obstructive disease.  LVEF was 50-55%.  LVEDP was mildly elevated at 16-20 mmHg.  BP was poorly controlled in the hospital.  She was started on carvedilol and diuresed with IV lasix.  Her echo during that hospitalization revealed LVEF 55-60% with mild LVH and grade 1 diastolic dysfunction.  Her heart catheterization was complicated by a small R groin hematoma.  She followed up with her PCP, Dr. Damita Dunnings, who wondered if she may have microvascular angina given her improvement with a beta blocker.  She continued to complain of intermittent chest pain.  Carvedilol was increased and she was started on aspirin and rosuvastatin.    Since her last appointment Traci [name] has been doing well.  She has been struggling with depression.  Physically she has been feeling fine.  She has been walking for exercise almost daily.  She walks for 30 to 45 minutes and feels well.  She has no exertional chest pain or shortness of breath.  She denies lower extremity edema or orthopnea.  She notes that she does snore and occasionally wakes herself up.  However she got a bed with ability to incline.  She no longer has symptoms and feels well rested when she wakes up in the morning.  She does not think she would be able to sleep with her CPAP machine.  BP has been  well-controlled lately.   Past Medical History:  Diagnosis Date  . Abnormal alkaline phosphatase test    with neg eval prev  . Arthritis   . Depression    Previously responded to Wellbutrin  . Headache(784.0)   . Hyperlipidemia   . Hypothyroidism   . Migraines   . Stroke ([place]) 12/2000  . Thyroid disease    Hypothyroidism    Past Surgical History:  Procedure Laterality Date  . CHOLECYSTECTOMY     ? 20 years ago  . LAPAROSCOPIC GASTRIC BANDING  2006  . LEFT HEART CATH AND CORONARY ANGIOGRAPHY N/A 11/22/2016   Procedure: Left Heart Cath and Coronary Angiography;  Surgeon: Wellington Hampshire, MD;  Location: [place] [facility];  Service: Cardiovascular;  Laterality: N/A;  . TOTAL KNEE ARTHROPLASTY Right 11/26/2012   Procedure: TOTAL KNEE ARTHROPLASTY- right ;  Surgeon: Hessie Dibble, MD;  Location: [place];  Service: Orthopedics;  Laterality: Right;     Current Outpatient Medications  Medication Sig Dispense Refill  . aspirin EC 81 MG tablet Take 81 mg by mouth daily.    [name] aspirin-acetaminophen-caffeine (EXCEDRIN MIGRAINE) 250-250-65 MG tablet Take 1 tablet by mouth every 8 (eight) hours as needed for migraine. 30 tablet 0  . buPROPion (WELLBUTRIN SR) 150 MG 12 hr tablet Take 1 tablet (150 mg total) by mouth 2 (two) times daily. 180 tablet 3  . carvedilol (COREG) 6.25 MG tablet  TAKE 1 TABLET BY MOUTH TWICE DAILY WITH A MEAL 180 tablet 0  . Cholecalciferol (VITAMIN D) 2000 units tablet Take 1 tablet (2,000 Units total) by mouth daily.    . cyanocobalamin 100 MCG tablet Take 100 mcg by mouth daily.    [name]. levothyroxine (SYNTHROID) 112 MCG tablet Take 1 tablet (112 mcg total) by mouth daily before breakfast. 90 tablet 3  . Multiple Vitamin (MULTIVITAMIN) tablet Take 1 tablet by mouth daily.     [name]. omeprazole (PRILOSEC) 20 MG capsule Take 1 capsule (20 mg total) by mouth daily. 90 capsule 3  . ondansetron (ZOFRAN) 4 MG tablet Take 1 tablet (4 mg total) by mouth every 8 (eight) hours as  needed for nausea or vomiting. 20 tablet 0  . pregabalin (LYRICA) 50 MG capsule Take 1 capsule by mouth twice daily 60 capsule 4  . rosuvastatin (CRESTOR) 20 MG tablet Take 1 tablet (20 mg total) by mouth daily. 90 tablet 3   No current facility-administered medications for this visit.     Allergies:   Lexapro [escitalopram oxalate], Meperidine hcl, Simvastatin, Venlafaxine, Codeine, and Tramadol    Social History:  The patient  reports that she has never smoked. She has never used smokeless tobacco. She reports that she does not drink alcohol or use drugs.   Family History:  The patient's family history includes Cancer in an other family member; Diabetes in her mother; Healthy in her brother; Heart attack in her father; Heart disease in an other family member; Melanoma in her sister; Stroke in her mother.    ROS:  Please see the history of present illness.   Otherwise, review of systems are positive for none.   All other systems are reviewed and negative.    PHYSICAL EXAM: VS:  BP 128/80 (BP Location: Left Arm, Patient Position: Sitting, Cuff Size: Normal)   Pulse 87   Temp (!) 97.2 F (36.2 C)   Ht 5\' 5"  (1.651 m)   Wt 203 lb (92.1 kg)   BMI 33.78 kg/m  , BMI Body mass index is 33.78 kg/m. GENERAL:  Well appearing HEENT: Pupils equal round and reactive, fundi not visualized, oral mucosa unremarkable NECK:  No jugular venous distention, waveform within normal limits, carotid upstroke brisk and symmetric, no bruits LUNGS:  Clear to auscultation bilaterally HEART:  RRR.  PMI not displaced or sustained,S1 and S2 within normal limits, no S3, no S4, no clicks, no rubs, no murmurs ABD:  Flat, positive bowel sounds normal in frequency in pitch, no bruits, no rebound, no guarding, no midline pulsatile mass, no hepatomegaly, no splenomegaly EXT:  2 plus pulses throughout, no edema, no cyanosis no clubbing SKIN:  No rashes no nodules NEURO:  Cranial nerves II through XII grossly intact,  motor grossly intact throughout PSYCH:  Cognitively intact, oriented to person place and time   EKG:  EKG is not ordered today. The ekg ordered 11/21/16 demonstrates sinus rhythm. Rate 91 bpm. 07/30/17: Sinus rhythm.  Rate 81 bpm.  01/29/18: Sinus rhythm.  Rate 78 bpm.    LHC 11/22/16  The left ventricular systolic function is normal.  LV end diastolic pressure is mildly elevated.  The left ventricular ejection fraction is 50-55% by visual estimate.  1. Minor luminal irregularities with no evidence of obstructive coronary artery disease. 2. Low normal LV systolic function with an EF of 50-55%. Mildly elevated left ventricular end-diastolic pressure (LVEDP between 16-20 mmHg).  Echo 11/22/16: Study Conclusions  - Left ventricle: The cavity size  was normal. Wall thickness was   increased in a pattern of mild LVH. Systolic function was normal.   The estimated ejection fraction was in the range of 55% to 60%.   Wall motion was normal; there were no regional wall motion   abnormalities. Doppler parameters are consistent with abnormal   left ventricular relaxation (grade 1 diastolic dysfunction). - Aortic valve: There was no stenosis. - Mitral valve: There was no significant regurgitation. - Left atrium: The atrium was mildly dilated. - Right ventricle: The cavity size was normal. Systolic function   was normal. - Pulmonary arteries: No complete TR doppler jet so unable to   estimate PA systolic pressure. - Inferior vena cava: The vessel was normal in size. The   respirophasic diameter changes were in the normal range (>= 50%),   consistent with normal central venous pressure.  Impressions:  - Normal LV size with mild LV hypertrophy. EF 55-60%. Normal RV   size and systolic function. No significant valvular   abnormalities.   Recent Labs: 05/27/2019: ALT 13; BUN 16; Creatinine, Ser 0.89; Hemoglobin 12.4; Platelets 229.0; Potassium 4.4; Sodium 140; TSH 1.78    Lipid Panel     Component Value Date/Time   CHOL 101 12/18/2018 0904   TRIG 156.0 (H) 12/18/2018 0904   HDL 29.20 (L) 12/18/2018 0904   CHOLHDL 3 12/18/2018 0904   VLDL 31.2 12/18/2018 0904   LDLCALC 40 12/18/2018 0904   LDLDIRECT 103.6 07/17/2011 0920      Wt Readings from Last 3 Encounters:  07/14/19 203 lb (92.1 kg)  05/27/19 203 lb 9 oz (92.3 kg)  12/12/18 208 lb 9.6 oz (94.6 kg)      ASSESSMENT AND PLAN:  # Hypertensive heart disease:  Blood pressure is much better.  She has been working on diet and exercise.  She was encouraged to keep this up.  Continue carvedilol.  # Non-obstructive CAD: # Chest pain: # Chronic diastolic dysfunction: Chest pain resolvedShe has shortness of breath on exertion when walking long distances but is able to exercise at the gym w ithout symptoms.  She has no heart failure symptoms and appears euvolemic on exam.    # Hyperlipidemia:  ASCVD 10 year risk is 8.8%. LDL 40 12/2018.  Continue rosuvastatin.   Current medicines are reviewed at length with the patient today.  The patient does not have concerns regarding medicines.  The following changes have been made: none  Labs/ tests ordered today include:   No orders of the defined types were placed in this encounter.    Disposition:   FU with Massai Hankerson C. Duke Salvia, MD, [facility] in 1 year.      Signed, Dolly Harbach C. Duke Salvia, MD, [facility]  07/14/2019 3:45 PM    [facility]

## 2019-07-18 ENCOUNTER — Other Ambulatory Visit: Payer: Self-pay | Admitting: Family Medicine

## 2019-07-18 ENCOUNTER — Encounter: Payer: Self-pay | Admitting: Family Medicine

## 2019-07-18 DIAGNOSIS — F32A Depression, unspecified: Secondary | ICD-10-CM

## 2019-07-18 DIAGNOSIS — F329 Major depressive disorder, single episode, unspecified: Secondary | ICD-10-CM

## 2019-07-18 NOTE — Telephone Encounter (Signed)
Please help this patient get set up with psychiatry.  I put in the referral.  Thanks.

## 2019-07-20 NOTE — Telephone Encounter (Signed)
Haynes Bast  You 2 days ago   Dr Damita Dunnings, I spoke with [facility] and Irene Shipper was going to call your patient and offer her an Appt for tomorrow! Marion      ============ Noted. Thanks.  Elsie Stain

## 2019-07-23 ENCOUNTER — Encounter: Payer: Self-pay | Admitting: Family Medicine

## 2019-08-09 ENCOUNTER — Other Ambulatory Visit: Payer: Self-pay | Admitting: Neurology

## 2019-08-14 ENCOUNTER — Other Ambulatory Visit: Payer: Self-pay | Admitting: Neurology

## 2019-08-14 ENCOUNTER — Telehealth: Payer: Self-pay

## 2019-08-14 ENCOUNTER — Other Ambulatory Visit: Payer: Self-pay | Admitting: Family Medicine

## 2019-08-14 DIAGNOSIS — E039 Hypothyroidism, unspecified: Secondary | ICD-10-CM

## 2019-08-14 NOTE — Telephone Encounter (Signed)
We were working on this today.  We were going to check with patient about making the change.  Please give the okay to the pharmacy.  No change in sig.  Needs recheck TSH 2 months after the change.  Order is in EMR.  Thanks.

## 2019-08-14 NOTE — Telephone Encounter (Signed)
Patient advised.  Lab appt scheduled.  

## 2019-08-14 NOTE — Telephone Encounter (Signed)
Patient last seen in September 2019 and needs a follow-up for additional refills.

## 2019-08-14 NOTE — Telephone Encounter (Signed)
Pt left v/m that pharmacy has not heard back if OK to change levothyroxine 112 mcg to Euthyrox 112 mcg. Pt request to call pharmacy to OK change to Euthyrox 112 mcg so pt can get medication.Please advise.

## 2019-08-30 ENCOUNTER — Other Ambulatory Visit: Payer: Self-pay | Admitting: Cardiovascular Disease

## 2019-10-15 ENCOUNTER — Other Ambulatory Visit (INDEPENDENT_AMBULATORY_CARE_PROVIDER_SITE_OTHER): Payer: 59

## 2019-10-15 ENCOUNTER — Other Ambulatory Visit: Payer: Self-pay

## 2019-10-15 DIAGNOSIS — E039 Hypothyroidism, unspecified: Secondary | ICD-10-CM | POA: Diagnosis not present

## 2019-10-15 LAB — TSH: TSH: 1.79 u[IU]/mL (ref 0.35–4.50)

## 2019-10-20 ENCOUNTER — Telehealth: Payer: Self-pay

## 2019-10-20 NOTE — Telephone Encounter (Signed)
I would keep the covid vaccine appointment and push back the CPX.  Thanks.

## 2019-10-20 NOTE — Telephone Encounter (Signed)
Patient advised.

## 2019-10-20 NOTE — Telephone Encounter (Signed)
Pt left v/m; pt is scheduled for CPX and a pneumonia vaccine on 10/24/19. Pt was just scheduled for 1st covid vaccine on 10/23/19 and pt wants to know if should reschedule her CPX and pneumonia vaccine for later time after completes 2nd covid vaccine. Pt request cb.

## 2019-10-23 ENCOUNTER — Other Ambulatory Visit: Payer: Self-pay | Admitting: Cardiovascular Disease

## 2019-10-24 ENCOUNTER — Encounter: Payer: 59 | Admitting: Family Medicine

## 2019-10-24 ENCOUNTER — Other Ambulatory Visit: Payer: Self-pay

## 2019-12-02 ENCOUNTER — Telehealth: Payer: Self-pay | Admitting: *Deleted

## 2019-12-02 NOTE — Telephone Encounter (Signed)
Patient left a voicemail stating that she got a my chart message about scheduling her covid vaccine. Patient stated that she has had both of her vaccines and the last one was 11/10/19. Called patient back and got her voicemail. Left a message on voicemail for patient to call back. We need to know the dates of both of the vaccines and which one she got.

## 2019-12-10 NOTE — Telephone Encounter (Signed)
See immunization record

## 2020-01-01 ENCOUNTER — Other Ambulatory Visit: Payer: Self-pay | Admitting: Family Medicine

## 2020-01-08 ENCOUNTER — Other Ambulatory Visit: Payer: Self-pay | Admitting: Family Medicine

## 2020-01-08 NOTE — Telephone Encounter (Signed)
Electronic refill request. Euthyrox Last office visit:   05/27/2019 Last Filled:   Levothyroxine  90 tablet 3 12/20/2018  Is it ok to refill, note different med name.

## 2020-01-09 NOTE — Telephone Encounter (Signed)
Left detailed message on voicemail. DPR 

## 2020-01-09 NOTE — Telephone Encounter (Signed)
Okay to continue.  rx sent.  Verify with patient about brand change to make sure she is aware.  Thanks.

## 2020-01-26 ENCOUNTER — Encounter: Payer: Self-pay | Admitting: Family Medicine

## 2020-01-26 ENCOUNTER — Other Ambulatory Visit: Payer: Self-pay

## 2020-01-26 ENCOUNTER — Ambulatory Visit (INDEPENDENT_AMBULATORY_CARE_PROVIDER_SITE_OTHER): Payer: 59 | Admitting: Family Medicine

## 2020-01-26 VITALS — BP 140/90 | HR 79 | Temp 97.1°F | Ht 64.0 in | Wt 197.4 lb

## 2020-01-26 DIAGNOSIS — F32A Depression, unspecified: Secondary | ICD-10-CM

## 2020-01-26 DIAGNOSIS — Z9884 Bariatric surgery status: Secondary | ICD-10-CM

## 2020-01-26 DIAGNOSIS — I10 Essential (primary) hypertension: Secondary | ICD-10-CM

## 2020-01-26 DIAGNOSIS — E039 Hypothyroidism, unspecified: Secondary | ICD-10-CM

## 2020-01-26 DIAGNOSIS — Z7189 Other specified counseling: Secondary | ICD-10-CM

## 2020-01-26 DIAGNOSIS — E785 Hyperlipidemia, unspecified: Secondary | ICD-10-CM

## 2020-01-26 DIAGNOSIS — Z0000 Encounter for general adult medical examination without abnormal findings: Secondary | ICD-10-CM | POA: Diagnosis not present

## 2020-01-26 DIAGNOSIS — Z23 Encounter for immunization: Secondary | ICD-10-CM | POA: Diagnosis not present

## 2020-01-26 DIAGNOSIS — Z780 Asymptomatic menopausal state: Secondary | ICD-10-CM

## 2020-01-26 LAB — LIPID PANEL
Cholesterol: 126 mg/dL (ref 0–200)
HDL: 35.1 mg/dL — ABNORMAL LOW (ref >39.00)
LDL Cholesterol: 57 mg/dL (ref 0–99)
NonHDL: 91.05
Total CHOL/HDL Ratio: 4
Triglycerides: 170 mg/dL — ABNORMAL HIGH (ref 0.0–149.0)
VLDL: 34 mg/dL (ref 0.0–40.0)

## 2020-01-26 NOTE — Patient Instructions (Addendum)
Use compression stockings if needed.   Go to the lab on the way out.   If you have mychart we'll likely use that to update you.    Pneurmonia shot today.  Let me see about setting you up with psychiatry and the general surgery clinic.  Take care.  Glad to see you.

## 2020-01-26 NOTE — Progress Notes (Signed)
This visit occurred during the SARS-CoV-2 public health emergency.  Safety protocols were in place, including screening questions prior to the visit, additional usage of staff PPE, and extensive cleaning of exam room while observing appropriate contact time as indicated for disinfecting solutions.  CPE- See plan.  Routine anticipatory guidance given to patient.  See health maintenance.  The possibility exists that previously documented standard health maintenance information may have been brought forward from a previous encounter into this note.  If needed, that same information has been updated to reflect the current situation based on today's encounter.    Tetanus 2012 covid vaccine 2021 shingrix 2019.   Flu shot due in the fall.  D/w pt, done at work.  PNA done 2021 cologuard 2019 Pap not due.   DXA ordered.   Mammogram 2020 Sister Traci [name] designated if patient were incapacitated.  Then Traci [name] [phone]) designated if patient were incapacitated.    Mood d/w pt.  She has been talking with her sister and niece and that has helped with her mood.  D/w pt. she was asked about getting set up with face-to-face psychiatry appointment, this is her preference.  This is reasonable.  Still compliant with Wellbutrin.  Elevated Cholesterol: Using medications without problems: yes Muscle aches: no Diet compliance: no Exercise: yes, rejoined the gym.   Still on Crestor at baseline.  Frequent burping and vomiting.  Unclear if related to prev lap band surgery.  No abdominal pain now.  Discussed with patient about getting general surgery evaluation given her history of lap band.  Hypertension:    Using medication without problems or lightheadedness: yes Chest pain with exertion:no Edema:no Short of breath:no She has varicose veins in the legs, occ sore on the medial thighs.    Hypothyroidism.  TSH wnl.  D/w pt. compliant with levothyroxine.  PMH and SH reviewed  Meds,  vitals, and allergies reviewed.   ROS: Per HPI.  Unless specifically indicated otherwise in HPI, the patient denies:  General: fever. Eyes: acute vision changes ENT: sore throat Cardiovascular: chest pain Respiratory: SOB GI: vomiting GU: dysuria Musculoskeletal: acute back pain Derm: acute rash Neuro: acute motor dysfunction Psych: worsening mood Endocrine: polydipsia Heme: bleeding Allergy: hayfever  GEN: nad, alert and oriented HEENT: ncat NECK: supple w/o LA CV: rrr. PULM: ctab, no inc wob ABD: soft, +bs EXT: no edema SKIN: no acute rash, small varicose veins noted.  No spreading erythema. (Discussed that she can use compression stockings as needed.)

## 2020-01-27 ENCOUNTER — Other Ambulatory Visit: Payer: Self-pay

## 2020-01-27 MED ORDER — ROSUVASTATIN CALCIUM 20 MG PO TABS: 20.0000 mg | ORAL_TABLET | Freq: Every day | ORAL | 3 refills | Status: DC

## 2020-01-27 MED ORDER — CARVEDILOL 6.25 MG PO TABS: ORAL_TABLET | ORAL | 2 refills | Status: DC

## 2020-01-29 ENCOUNTER — Other Ambulatory Visit: Payer: Self-pay | Admitting: Family Medicine

## 2020-01-29 DIAGNOSIS — E2839 Other primary ovarian failure: Secondary | ICD-10-CM

## 2020-01-30 ENCOUNTER — Other Ambulatory Visit: Payer: Self-pay | Admitting: *Deleted

## 2020-01-30 NOTE — Telephone Encounter (Signed)
Left message for Traci [name] to call and verify that she wants prescriptions sent to [facility].  Medications were all recently refill at [facility].

## 2020-02-03 DIAGNOSIS — Z7189 Other specified counseling: Secondary | ICD-10-CM | POA: Insufficient documentation

## 2020-02-03 DIAGNOSIS — Z9884 Bariatric surgery status: Secondary | ICD-10-CM | POA: Insufficient documentation

## 2020-02-03 MED ORDER — BUPROPION HCL ER (SR) 150 MG PO TB12: 150.0000 mg | ORAL_TABLET | Freq: Two times a day (BID) | ORAL | 3 refills | Status: DC

## 2020-02-03 MED ORDER — LEVOTHYROXINE SODIUM 112 MCG PO TABS: ORAL_TABLET | ORAL | 3 refills | Status: DC

## 2020-02-03 MED ORDER — OMEPRAZOLE 20 MG PO CPDR: 20.0000 mg | DELAYED_RELEASE_CAPSULE | Freq: Every day | ORAL | 3 refills | Status: DC

## 2020-02-03 NOTE — Assessment & Plan Note (Signed)
Continue work on diet and exercise.  See notes on labs.  Continue Crestor.

## 2020-02-03 NOTE — Telephone Encounter (Signed)
Patient contacted the office and stated that she wants refills sent to [facility]. Refills have been sent.

## 2020-02-03 NOTE — Assessment & Plan Note (Signed)
Reasonable control.  Continue carvedilol.  She will update me as needed.

## 2020-02-03 NOTE — Assessment & Plan Note (Signed)
TSH normal ?- Continue levothyroxine ?

## 2020-02-03 NOTE — Assessment & Plan Note (Signed)
No change in meds at this point.  Discussed with patient about getting face-to-face psychiatry appointment set up, as this would be her preference.

## 2020-02-03 NOTE — Assessment & Plan Note (Signed)
Sister [name] [name] designated if patient were incapacitated. Then Sabrina Langeneggar [phone]) designated if patient were incapacitated.  

## 2020-02-03 NOTE — Assessment & Plan Note (Addendum)
Tetanus 2012 covid vaccine 2021 shingrix 2019.   Flu shot due in the fall.  D/w pt, done at work.  PNA done 2021 cologuard 2019 Pap not due.   DXA ordered.   Mammogram 2020 Sister Sherrlyn Hock designated if patient were incapacitated.  Then Martie Lee Langeneggar [phone]) designated if patient were incapacitated.

## 2020-02-03 NOTE — Assessment & Plan Note (Signed)
Benign abdominal exam but history of frequent burping and vomiting.  I would like general surgery evaluation regarding the LAP-BAND.  She agrees.  Refer.

## 2020-03-23 ENCOUNTER — Ambulatory Visit (INDEPENDENT_AMBULATORY_CARE_PROVIDER_SITE_OTHER): Payer: 59 | Admitting: Adult Health

## 2020-03-23 ENCOUNTER — Other Ambulatory Visit: Payer: Self-pay

## 2020-03-23 ENCOUNTER — Encounter: Payer: Self-pay | Admitting: Adult Health

## 2020-03-23 VITALS — BP 139/86 | HR 90 | Ht 66.0 in | Wt 200.0 lb

## 2020-03-23 DIAGNOSIS — F331 Major depressive disorder, recurrent, moderate: Secondary | ICD-10-CM

## 2020-03-23 MED ORDER — SERTRALINE HCL 50 MG PO TABS: ORAL_TABLET | ORAL | 2 refills | Status: DC

## 2020-03-23 NOTE — Progress Notes (Signed)
[facility] MD/PA/NP Initial Note  03/23/2020 5:38 PM Traci [name]  MRN:  [id number]  Chief Complaint:   HPI:   Describes mood today as "ok". Pleasant. Tearful - "cries all the time". Mood symptoms - reports depression, anxiety, and irritability. More depressed overall. Felt like Wellbutrin was helpful in the "beginning". Has taken for the past 10 years with an increase 2 years ago. Stating "I don't want to dry all the time". Stable interest and motivation. Taking medications as prescribed.  Energy levels stable. Active, does not have a regular exercise routine. Has recently joined [employer]. Walking twice a day.  Enjoys some usual interests and activities. Single. Lives with a room-mate. Talking with friends. Mother deceased in 01/06/11.  Appetite adequate. Weight stable - 200 pounds. History of lap band surgery - removed 3 weeks ago after getting sick.  Sleeps well most nights. Averages 7 hours. Denies daytime napping. Focus and concentration stable. Completing tasks. Managing aspects of household. 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  Denies SI or HI. Denies AH or VH.  Previous medication trials: Wellbutrin SR 150mg  BID - 2 years.   Visit Diagnosis:    ICD-10-CM   1. Major depressive disorder, recurrent episode, moderate (HCC)  F33.1 sertraline (ZOLOFT) 50 MG tablet    Past Psychiatric History: Denies psychiatric hospitalization.  Past Medical History:  Past Medical History:  Diagnosis Date  . Abnormal alkaline phosphatase test    with neg eval prev  . Arthritis   . Depression    Previously responded to Wellbutrin  . Headache(784.0)   . Hyperlipidemia   . Hypothyroidism   . Migraines   . Stroke (HCC) 01/05/2001  . Thyroid disease    Hypothyroidism    Past Surgical History:  Procedure Laterality Date  . CHOLECYSTECTOMY     ? 20 years ago  . LAPAROSCOPIC GASTRIC BANDING  01/05/05  . LEFT HEART CATH  AND CORONARY ANGIOGRAPHY N/A 11/22/2016   Procedure: Left Heart Cath and Coronary Angiography;  Surgeon: 11/24/2016, MD;  Location: [facility]IVE [facility];  Service: Cardiovascular;  Laterality: N/A;  . TOTAL KNEE ARTHROPLASTY Right 11/26/2012   Procedure: TOTAL KNEE ARTHROPLASTY- right ;  Surgeon: 11/28/2012, MD;  Location: [facility] OR;  Service: Orthopedics;  Laterality: Right;    Family Psychiatric History: Sister admitted 30 years ago - suicide attempt. Other family members with depression.   Family History:  Family History  Problem Relation Age of Onset  . Stroke Mother   . Diabetes Mother   . Heart attack Father        Deceased, 53  . Cancer Other        Ovarian, Uterine  . Heart disease Other   . Melanoma Sister   . Healthy Brother   . Colon cancer Neg Hx   . Breast cancer Neg Hx     Social History:  Social History   Socioeconomic History  . Marital status: Single    Spouse name: Not on file  . Number of children: 0  . Years of education: 18  . Highest education level: Not on file  Occupational History  . Occupation: 12: UNEMPLOYED  Tobacco Use  . Smoking status: Never Smoker  . Smokeless tobacco: Never Used  Vaping Use  . Vaping Use: Never used  Substance and Sexual Activity  . Alcohol use: No    Alcohol/week: 0.0 standard drinks  Comment: "maybe one on new years"  . Drug use: No  . Sexual activity: Not on file  Other Topics Concern  . Not on file  Social History Narrative   Regular exercise:  Yes    Works for city of [employer] level of education:  Automotive engineer, IT consultant   Lives with roommate in a 2 story home.     No children.    Social Determinants of Health   Financial Resource Strain:   . Difficulty of Paying Living Expenses:   Food Insecurity:   . Worried About Programme researcher, broadcasting/film/video in the Last Year:   . Barista in the Last Year:   Transportation Needs:   . Freight forwarder (Medical):   [name] Lack of  Transportation (Non-Medical):   Physical Activity:   . Days of Exercise per Week:   . Minutes of Exercise per Session:   Stress:   . Feeling of Stress :   Social Connections:   . Frequency of Communication with Friends and Family:   . Frequency of Social Gatherings with Friends and Family:   . Attends Religious Services:   . Active Member of Clubs or Organizations:   . Attends Banker Meetings:   [name] Marital Status:     Allergies:  Allergies  Allergen Reactions  . Lexapro [Escitalopram Oxalate] Other (See Comments)    Fatigue, sedation  . Meperidine Hcl Other (See Comments)    Cardiac arrest and syncope  . Simvastatin Other (See Comments)    REACTION: muscle aches  . Venlafaxine     Intolerant.  Joint aches.  . Codeine Itching  . Tramadol Itching    Metabolic Disorder Labs: Lab Results  Component Value Date   HGBA1C 5.0 05/27/2019   MPG 94 11/22/2016   No results found for: PROLACTIN Lab Results  Component Value Date   CHOL 126 01/26/2020   TRIG 170.0 (H) 01/26/2020   HDL 35.10 (L) 01/26/2020   CHOLHDL 4 01/26/2020   VLDL 34.0 01/26/2020   LDLCALC 57 01/26/2020   LDLCALC 40 12/18/2018   Lab Results  Component Value Date   TSH 1.79 10/15/2019   TSH 1.78 05/27/2019    Therapeutic Level Labs: No results found for: LITHIUM No results found for: VALPROATE No components found for:  CBMZ  Current Medications: Current Outpatient Medications  Medication Sig Dispense Refill  . aspirin EC 81 MG tablet Take 81 mg by mouth daily.    [name] aspirin-acetaminophen-caffeine (EXCEDRIN MIGRAINE) 250-250-65 MG tablet Take 1 tablet by mouth every 8 (eight) hours as needed for migraine. 30 tablet 0  . Biotin 5 MG TABS Take 5 mg by mouth.    [name] buPROPion (WELLBUTRIN SR) 150 MG 12 hr tablet Take 1 tablet (150 mg total) by mouth 2 (two) times daily. 180 tablet 3  . carvedilol (COREG) 6.25 MG tablet TAKE 1 TABLET BY MOUTH TWICE DAILY WITH A MEAL 180 tablet 2  .  Cholecalciferol (VITAMIN D) 2000 units tablet Take 1 tablet (2,000 Units total) by mouth daily.    . cyanocobalamin 100 MCG tablet Take 100 mcg by mouth daily.    [name] levothyroxine (EUTHYROX) 112 MCG tablet TAKE 1 TABLET BY MOUTH  DAILY BEFORE  BREAKFAST 90 tablet 3  . Multiple Vitamin (MULTIVITAMIN) tablet Take 1 tablet by mouth daily.     [name] omeprazole (PRILOSEC) 20 MG capsule Take 1 capsule (20 mg total) by mouth daily. 90 capsule 3  . rosuvastatin (CRESTOR)  20 MG tablet Take 1 tablet (20 mg total) by mouth daily. 30 tablet 3  . sertraline (ZOLOFT) 50 MG tablet Take 1/2 tablet daily x 7 days, then one tablet daily. 30 tablet 2   No current facility-administered medications for this visit.    Medication Side Effects: none  Orders placed this visit:  No orders of the defined types were placed in this encounter.   Psychiatric Specialty Exam:  Review of Systems  Musculoskeletal: Negative for gait problem.  Neurological: Negative for tremors.  Psychiatric/Behavioral:       Please refer to HPI    Blood pressure 139/86, pulse 90, height 5\' 6"  (1.676 m), weight 200 lb (90.7 kg).Body mass index is 32.28 kg/m.  General Appearance: Casual, Neat and Well Groomed  Eye Contact:  Good  Speech:  Clear and Coherent and Normal Rate  Volume:  Normal  Mood:  Anxious, Depressed and Irritable  Affect:  Appropriate and Congruent  Thought Process:  Coherent and Descriptions of Associations: Intact  Orientation:  Full (Time, Place, and Person)  Thought Content: Logical   Suicidal Thoughts:  No  Homicidal Thoughts:  No  Memory:  WNL  Judgement:  Good  Insight:  Good  Psychomotor Activity:  Normal  Concentration:  Concentration: Good  Recall:  Good  Fund of Knowledge: Good  Language: Good  Assets:  Communication Skills Desire for Improvement Financial Resources/Insurance Housing Intimacy Leisure Time Physical Health Resilience Social  Support Talents/Skills Transportation Vocational/Educational  ADL's:  Intact  Cognition: WNL  Prognosis:  Good   Screenings:  PHQ2-9     Office Visit from 01/26/2020 in [facility]  PHQ-2 Total Score 1  PHQ-9 Total Score 4      Receiving Psychotherapy: No   Treatment Plan/Recommendations:   Plan:  PDMP reviewed  1. Continue Wellbutrin SR 150mg  BID 2. Add Zoloft 50mg  - 1/2 tablet daily for 7 days, then one tablet daily.   Lexapro listed as a drug intolerance - sedation and fatigue. No allergic reaction.    RTC 4 weeks  Patient advised to contact office with any questions, adverse effects, or acute worsening in signs and symptoms.    [facility], NP

## 2020-04-07 ENCOUNTER — Telehealth: Payer: Self-pay | Admitting: Adult Health

## 2020-04-07 NOTE — Telephone Encounter (Signed)
Noted. We can discuss at her appt.

## 2020-04-07 NOTE — Telephone Encounter (Signed)
Pt called and left a message stating that she took generic of zoloft 1/2 pill for 7 days and it gave her a bad headache and stomach hurt. Then she started a whole pill 50 mg and it gave her migraines so she stopped taking it. She just wanted to let her know before her next appt on 8/17

## 2020-04-14 ENCOUNTER — Encounter: Payer: Self-pay | Admitting: Family Medicine

## 2020-04-14 ENCOUNTER — Other Ambulatory Visit: Payer: Self-pay

## 2020-04-14 ENCOUNTER — Ambulatory Visit
Admission: RE | Admit: 2020-04-14 | Discharge: 2020-04-14 | Disposition: A | Discharge status: Home / Self Care | Payer: 59 | Source: Ambulatory Visit | Attending: Family Medicine | Admitting: Family Medicine

## 2020-04-14 DIAGNOSIS — E2839 Other primary ovarian failure: Secondary | ICD-10-CM

## 2020-04-15 ENCOUNTER — Encounter: Payer: Self-pay | Admitting: Podiatry

## 2020-04-15 ENCOUNTER — Ambulatory Visit: Payer: 59 | Admitting: Podiatry

## 2020-04-15 DIAGNOSIS — L600 Ingrowing nail: Secondary | ICD-10-CM

## 2020-04-15 DIAGNOSIS — M779 Enthesopathy, unspecified: Secondary | ICD-10-CM

## 2020-04-15 MED ORDER — NEOMYCIN-POLYMYXIN-HC 3.5-10000-1 OT SOLN
OTIC | 1 refills | Status: DC
Start: 2020-04-15 — End: 2020-06-22

## 2020-04-15 MED ORDER — DICLOFENAC SODIUM 75 MG PO TBEC
75.0000 mg | DELAYED_RELEASE_TABLET | Freq: Two times a day (BID) | ORAL | 2 refills | Status: DC
Start: 2020-04-15 — End: 2022-05-23

## 2020-04-15 NOTE — Patient Instructions (Signed)

## 2020-04-15 NOTE — Progress Notes (Signed)
Subjective:   Patient ID: Traci [name], female   DOB: 64 y.o.   MRN: [id number]   HPI Patient states no acute her chronic ingrown toenail of the left big toe and also the second nail left is very dystrophic thickened and it makes it hard for her to wear shoe gear comfortably and also states she is getting some pain in the outside of the right foot.  Patient does not smoke likes to be active and has been inhibited something due to pain and is tried to get it out herself and soak it without relief   Review of Systems  All other systems reviewed and are negative.       Objective:  Physical Exam Vitals and nursing note reviewed.  Constitutional:      Appearance: She is well-developed.  Pulmonary:     Effort: Pulmonary effort is normal.  Musculoskeletal:        General: Normal range of motion.  Skin:    General: Skin is warm.  Neurological:     Mental Status: She is alert.     Neurovascular status intact muscle strength found to be adequate range of motion within normal limits.  Patient is noted to have an incurvated lateral border of the left hallux that is painful when pressed making shoe gear difficult with no erythema.  Digit or redness noted with a damaged second nail left that incurvated in both corners and makes it painful for her to wear shoe gear.  Mild discomfort lateral side right foot with inflammation around the peroneal tendon     Assessment:  Ingrown toenail deformity left hallux lateral border with damaged left second nail along with tendinitis right     Plan:  H&P reviewed both conditions will get a focus on the left and I explained for the right oral anti-inflammatories ice and wrote for diclofenac.  For the left I recommended correction and explained procedure risks and I infiltrated the left hallux and second toes with a total of 120 mg like Marcaine mixture sterile prep applied to the left hallux second digits and after appropriate prep I remove the lateral  border the left hallux and the entire second nail left exposed matrix and applied phenol 3 applications 30 seconds to the left hallux and 4 applications 30 seconds left second toe followed by alcohol lavage of both toes.  Applied sterile dressings gave instructions on soaks and to leave dressings on 24 hours but take them off earlier if any drainage were to occur or if throbbing and I wrote prescription for drops with instructions.  Placed on oral anti-inflammatory and if symptoms get worse right then we will consider treatment for this particular condition

## 2020-04-20 ENCOUNTER — Other Ambulatory Visit: Payer: Self-pay

## 2020-04-20 ENCOUNTER — Encounter: Payer: Self-pay | Admitting: Adult Health

## 2020-04-20 ENCOUNTER — Ambulatory Visit (INDEPENDENT_AMBULATORY_CARE_PROVIDER_SITE_OTHER): Payer: 59 | Admitting: Adult Health

## 2020-04-20 DIAGNOSIS — F411 Generalized anxiety disorder: Secondary | ICD-10-CM

## 2020-04-20 DIAGNOSIS — F331 Major depressive disorder, recurrent, moderate: Secondary | ICD-10-CM | POA: Diagnosis not present

## 2020-04-20 NOTE — Progress Notes (Signed)
Traci [name] [id number] June 04, 1956 64 y.o.  Subjective:   Patient ID:  Traci [name] is a 64 y.o. (DOB 1955/12/17) female.  Chief Complaint: No chief complaint on file.   HPI Traci [name] presents to the office today for follow-up of MDD  Describes mood today as "ok". Pleasant. Tearful - "every day". Mood symptoms - reports depression, anxiety, and irritability. Stating "I'm more depressed than anything". Did not tolerate the Zoloft - headaches and nausea. Mood not any better. Is willing to try another medication. Varying interest and motivation. Taking medications as prescribed.  Energy levels stable. Active, has a regular exercise routine - 3 times a week. [employer]. Walking some days.  Enjoys some usual interests and activities. Single. Lives with a room-mate and her grandson age 66. Talking with friends.  Appetite adequate. Weight gain - 202 pounds.  Sleeps well most nights. Averages 7 hours.  Focus and concentration stable. Completing tasks. Managing aspects of household. Works for the [employer] in [employer] - 40 hours a week.  Denies SI or HI. Denies AH or VH.  Previous medication trials: Wellbutrin SR 150mg  BID - 2 years, Zoloft - did not tolerate - headaches and nauseas. Lexapro listed as a drug intolerance - sedation and fatigue. No allergic reaction.     PHQ2-9     Office Visit from 01/26/2020 in [facility] Total Score 1  PHQ-9 Total Score 4       Review of Systems:  Review of Systems  Musculoskeletal: Negative for gait problem.  Neurological: Negative for tremors.  Psychiatric/Behavioral:       Please refer to HPI    Medications: I have reviewed the patient's current medications.  Current Outpatient Medications  Medication Sig Dispense Refill  . aspirin EC 81 MG tablet Take 81 mg by mouth daily.    [facility] aspirin-acetaminophen-caffeine (EXCEDRIN MIGRAINE) 250-250-65 MG tablet Take 1 tablet by  mouth every 8 (eight) hours as needed for migraine. 30 tablet 0  . Biotin 5 MG TABS Take 5 mg by mouth.    [name] buPROPion (WELLBUTRIN SR) 150 MG 12 hr tablet Take 1 tablet (150 mg total) by mouth 2 (two) times daily. 180 tablet 3  . carvedilol (COREG) 6.25 MG tablet TAKE 1 TABLET BY MOUTH TWICE DAILY WITH A MEAL 180 tablet 2  . Cholecalciferol (VITAMIN D) 2000 units tablet Take 1 tablet (2,000 Units total) by mouth daily.    . cyanocobalamin 100 MCG tablet Take 100 mcg by mouth daily.    . diclofenac (VOLTAREN) 75 MG EC tablet Take 1 tablet (75 mg total) by mouth 2 (two) times daily. 50 tablet 2  . levothyroxine (EUTHYROX) 112 MCG tablet TAKE 1 TABLET BY MOUTH  DAILY BEFORE  BREAKFAST 90 tablet 3  . Multiple Vitamin (MULTIVITAMIN) tablet Take 1 tablet by mouth daily.     [name] neomycin-polymyxin-hydrocortisone (CORTISPORIN) OTIC solution Apply 1-2 drops to toe BID after soaking 10 mL 1  . omeprazole (PRILOSEC) 20 MG capsule Take 1 capsule (20 mg total) by mouth daily. 90 capsule 3  . rosuvastatin (CRESTOR) 20 MG tablet Take 1 tablet (20 mg total) by mouth daily. 30 tablet 3   No current facility-administered medications for this visit.    Medication Side Effects: None  Allergies:  Allergies  Allergen Reactions  . Lexapro [Escitalopram Oxalate] Other (See Comments)    Fatigue, sedation  . Meperidine Hcl Other (See Comments)    Cardiac arrest  and syncope  . Simvastatin Other (See Comments)    REACTION: muscle aches  . Venlafaxine     Intolerant.  Joint aches.  . Codeine Itching  . Tramadol Itching    Past Medical History:  Diagnosis Date  . Abnormal alkaline phosphatase test    with neg eval prev  . Arthritis   . Depression    Previously responded to Wellbutrin  . Headache(784.0)   . Hyperlipidemia   . Hypothyroidism   . Migraines   . Osteoporosis   . Stroke (HCC) 12/2000  . Thyroid disease    Hypothyroidism    Family History  Problem Relation Age of Onset  . Stroke Mother    . Diabetes Mother   . Heart attack Father        Deceased, 71  . Cancer Other        Ovarian, Uterine  . Heart disease Other   . Melanoma Sister   . Healthy Brother   . Colon cancer Neg Hx   . Breast cancer Neg Hx     Social History   Socioeconomic History  . Marital status: Single    Spouse name: Not on file  . Number of children: 0  . Years of education: 31  . Highest education level: Not on file  Occupational History  . Occupation: Scientist, physiological: UNEMPLOYED  Tobacco Use  . Smoking status: Never Smoker  . Smokeless tobacco: Never Used  Vaping Use  . Vaping Use: Never used  Substance and Sexual Activity  . Alcohol use: No    Alcohol/week: 0.0 standard drinks    Comment: "maybe one on new years"  . Drug use: No  . Sexual activity: Not on file  Other Topics Concern  . Not on file  Social History Narrative   Regular exercise:  Yes    Works for [employer] level of education:  Automotive engineer, IT consultant   Lives with roommate in a 2 story home.     No children.    Social Determinants of Health   Financial Resource Strain:   . Difficulty of Paying Living Expenses:   Food Insecurity:   . Worried About Programme researcher, broadcasting/film/video in the Last Year:   . Barista in the Last Year:   Transportation Needs:   . Freight forwarder (Medical):   [name] Lack of Transportation (Non-Medical):   Physical Activity:   . Days of Exercise per Week:   . Minutes of Exercise per Session:   Stress:   . Feeling of Stress :   Social Connections:   . Frequency of Communication with Friends and Family:   . Frequency of Social Gatherings with Friends and Family:   . Attends Religious Services:   . Active Member of Clubs or Organizations:   . Attends Banker Meetings:   [name] Marital Status:   Intimate Partner Violence:   . Fear of Current or Ex-Partner:   . Emotionally Abused:   [name] Physically Abused:   . Sexually Abused:     Past Medical History,  Surgical history, Social history, and Family history were reviewed and updated as appropriate.   Please see review of systems for further details on the patient's review from today.   Objective:   Physical Exam:  There were no vitals taken for this visit.  Physical Exam Constitutional:      General: She is not in acute distress. Musculoskeletal:  General: No deformity.  Neurological:     Mental Status: She is alert and oriented to person, place, and time.     Coordination: Coordination normal.  Psychiatric:        Attention and Perception: Attention and perception normal. She does not perceive auditory or visual hallucinations.        Mood and Affect: Mood normal. Mood is not anxious or depressed. Affect is not labile, blunt, angry or inappropriate.        Speech: Speech normal.        Behavior: Behavior normal.        Thought Content: Thought content normal. Thought content is not paranoid or delusional. Thought content does not include homicidal or suicidal ideation. Thought content does not include homicidal or suicidal plan.        Cognition and Memory: Cognition and memory normal.        Judgment: Judgment normal.     Comments: Insight intact     Lab Review:     Component Value Date/Time   NA 140 05/27/2019 0841   K 4.4 05/27/2019 0841   CL 105 05/27/2019 0841   CO2 29 05/27/2019 0841   GLUCOSE 96 05/27/2019 0841   BUN 16 05/27/2019 0841   CREATININE 0.89 05/27/2019 0841   CREATININE 0.79 06/11/2015 1636   CALCIUM 9.5 05/27/2019 0841   PROT 6.7 05/27/2019 0841   ALBUMIN 4.2 05/27/2019 0841   AST 17 05/27/2019 0841   ALT 13 05/27/2019 0841   ALKPHOS 129 (H) 05/27/2019 0841   BILITOT 0.5 05/27/2019 0841   GFRNONAA 56 (L) 11/24/2016 0339   GFRAA >60 11/24/2016 0339       Component Value Date/Time   WBC 5.6 05/27/2019 0841   RBC 4.16 05/27/2019 0841   HGB 12.4 05/27/2019 0841   HCT 37.1 05/27/2019 0841   PLT 229.0 05/27/2019 0841   MCV 89.3 05/27/2019  0841   MCV 89.9 09/05/2014 0935   MCH 28.8 11/24/2016 0339   MCHC 33.4 05/27/2019 0841   RDW 14.2 05/27/2019 0841   LYMPHSABS 1.1 05/27/2019 0841   MONOABS 0.6 05/27/2019 0841   EOSABS 0.1 05/27/2019 0841   BASOSABS 0.0 05/27/2019 0841    No results found for: POCLITH, LITHIUM   No results found for: PHENYTOIN, PHENOBARB, VALPROATE, CBMZ   .res Assessment: Plan:    Plan:  PDMP reviewed  1. Continue Wellbutrin SR 150mg  BID 2. D/C Zoloft 50mg  - GI issues 3. Add Trintellix - 5mg /10 - start at 2.5mg  daily 4. Add Deplin 15mg  daily - 4 weeks of samples   RTC 4 weeks   Patient advised to contact office with any questions, adverse effects, or acute worsening in signs and symptoms.     Diagnoses and all orders for this visit:  Major depressive disorder, recurrent episode, moderate (HCC)  Generalized anxiety disorder     Please see After Visit Summary for patient specific instructions.  Future Appointments  Date Time Provider Department Center  04/22/2020 11:00 AM , MD [facility]  05/25/2020  3:40 PM Jc Veron, 04/24/2020, NP [facility] None  07/13/2020  3:00 PM [facility], MD [facility]    No orders of the defined types were placed in this encounter.   -------------------------------

## 2020-04-22 ENCOUNTER — Ambulatory Visit: Payer: 59 | Admitting: Family Medicine

## 2020-04-22 ENCOUNTER — Encounter: Payer: Self-pay | Admitting: Family Medicine

## 2020-04-22 ENCOUNTER — Other Ambulatory Visit: Payer: Self-pay

## 2020-04-22 VITALS — BP 146/86 | HR 88 | Temp 97.3°F | Ht 66.0 in | Wt 207.6 lb

## 2020-04-22 DIAGNOSIS — M810 Age-related osteoporosis without current pathological fracture: Secondary | ICD-10-CM

## 2020-04-22 NOTE — Progress Notes (Signed)
This visit occurred during the SARS-CoV-2 public health emergency.  Safety protocols were in place, including screening questions prior to the visit, additional usage of staff PPE, and extensive cleaning of exam room while observing appropriate contact time as indicated for disinfecting solutions.  She is going to f/u with psych clinic.  She is going to start trintellix in the near future but hasn't started yet.    Osteoporosis.  Recent bone density test discussed with patient.  See plan  Meds, vitals, and allergies reviewed.   ROS: Per HPI unless specifically indicated in ROS section   nad ncat

## 2020-04-22 NOTE — Patient Instructions (Addendum)
I would only do one thing at a time.  I would follow up with psychiatry.    Think about options (ie fosamax- pill vs prolia-injection) and update me when you make a decision.  Keep taking calcium/vitamin D in the meantime.    Take care.  Glad to see you.

## 2020-04-25 NOTE — Assessment & Plan Note (Addendum)
She had lap band surgery but it is decompressed.  D/w pt about DXA results and osteoporosis path/phys in general, including vit D and calcium.  Reasonable to consider treatment with bisphosphonate, ie fosamax.  D/w pt about risk benefit, especially GI sx, jaw and long bone pathology.   She'll consider and update me as needed after she has had follow-up with psychiatry.  We only wanted to make 1 change at a time. >15 minutes spent in face to face time with patient, >50% spent in counselling or coordination of care.

## 2020-05-05 ENCOUNTER — Encounter: Payer: Self-pay | Admitting: Family Medicine

## 2020-05-18 ENCOUNTER — Encounter (INDEPENDENT_AMBULATORY_CARE_PROVIDER_SITE_OTHER): Payer: Self-pay

## 2020-05-19 ENCOUNTER — Other Ambulatory Visit: Payer: Self-pay | Admitting: Cardiovascular Disease

## 2020-05-25 ENCOUNTER — Ambulatory Visit (INDEPENDENT_AMBULATORY_CARE_PROVIDER_SITE_OTHER): Payer: 59 | Admitting: Adult Health

## 2020-05-25 ENCOUNTER — Encounter: Payer: Self-pay | Admitting: Adult Health

## 2020-05-25 ENCOUNTER — Other Ambulatory Visit: Payer: Self-pay

## 2020-05-25 DIAGNOSIS — F331 Major depressive disorder, recurrent, moderate: Secondary | ICD-10-CM

## 2020-05-25 DIAGNOSIS — F411 Generalized anxiety disorder: Secondary | ICD-10-CM

## 2020-05-25 NOTE — Progress Notes (Signed)
Traci [name] [id number] May 14, 1956 64 y.o.  Subjective:   Patient ID:  Traci [name] is a 64 y.o. (DOB 07-21-1956) female.  Chief Complaint: No chief complaint on file.   HPI Traci [name] presents to the office today for follow-up of MDD and GAD.  Describes mood today as "about the same". Pleasant. Tearful - "every day". Mood symptoms - reports depression, anxiety, and irritability. Stating "I'm more depressed than anything". Concerned mood will continue to decline with holidays coming up. Has not seen family in 2 years. Did not tolerate the Trintellix - leg cramps and arms hurt. Is willing to try another medication. Varying interest and motivation. Taking medications as prescribed.  Energy levels stable. Active, has a regular exercise routine - 3 times a week. [employer].  Enjoys some usual interests and activities. Single. Lives with a room-mate and her grandson age 65. Talking with friends.  Appetite adequate. Weight gain - 208 pounds.  Sleeps well most nights. Averages 7 to 8 hours.  Focus and concentration stable. Completing tasks. Managing aspects of household. Works for the [employer] in [employer] - 40 hours a week.  Denies SI or HI. Denies AH or VH.  Previous medication trials: Wellbutrin SR 150mg  BID - 2 years, Zoloft - did not tolerate - headaches and nauseas. Lexapro listed as a drug intolerance - sedation and fatigue. Trintellix - leg cramps and pain in arms.      PHQ2-9     Office Visit from 01/26/2020 in [facility] at [facility] Total Score 1  PHQ-9 Total Score 4       Review of Systems:  Review of Systems  Musculoskeletal: Negative for gait problem.  Neurological: Negative for tremors.  Psychiatric/Behavioral:       Please refer to HPI    Medications: I have reviewed the patient's current medications.  Current Outpatient Medications  Medication Sig Dispense Refill   aspirin EC 81 MG tablet Take 81  mg by mouth daily.     aspirin-acetaminophen-caffeine (EXCEDRIN MIGRAINE) 250-250-65 MG tablet Take 1 tablet by mouth every 8 (eight) hours as needed for migraine. 30 tablet 0   Biotin 5 MG TABS Take 5 mg by mouth.     buPROPion (WELLBUTRIN SR) 150 MG 12 hr tablet Take 1 tablet (150 mg total) by mouth 2 (two) times daily. 180 tablet 3   carvedilol (COREG) 6.25 MG tablet TAKE 1 TABLET BY MOUTH TWICE DAILY WITH A MEAL 180 tablet 2   Cholecalciferol (VITAMIN D) 2000 units tablet Take 1 tablet (2,000 Units total) by mouth daily.     cyanocobalamin 100 MCG tablet Take 100 mcg by mouth daily.     diclofenac (VOLTAREN) 75 MG EC tablet Take 1 tablet (75 mg total) by mouth 2 (two) times daily. 50 tablet 2   levothyroxine (EUTHYROX) 112 MCG tablet TAKE 1 TABLET BY MOUTH  DAILY BEFORE  BREAKFAST 90 tablet 3   Multiple Vitamin (MULTIVITAMIN) tablet Take 1 tablet by mouth daily.      neomycin-polymyxin-hydrocortisone (CORTISPORIN) OTIC solution Apply 1-2 drops to toe BID after soaking 10 mL 1   omeprazole (PRILOSEC) 20 MG capsule Take 1 capsule (20 mg total) by mouth daily. 90 capsule 3   rosuvastatin (CRESTOR) 20 MG tablet TAKE 1 TABLET BY MOUTH  DAILY 30 tablet 3   gabapentin (NEURONTIN) 100 MG capsule Take 100 mg by mouth at bedtime.     No current facility-administered medications for this visit.  Medication Side Effects: None  Allergies:  Allergies  Allergen Reactions   Lexapro [Escitalopram Oxalate] Other (See Comments)    Fatigue, sedation   Meperidine Hcl Other (See Comments)    Cardiac arrest and syncope   Simvastatin Other (See Comments)    REACTION: muscle aches   Tape     Allergic to bandaid adhesive.     Venlafaxine     Intolerant.  Joint aches.   Codeine Itching   Tramadol Itching    Past Medical History:  Diagnosis Date   Abnormal alkaline phosphatase test    with neg eval prev   Arthritis    Depression    Previously responded to Wellbutrin    Headache(784.0)    Hyperlipidemia    Hypothyroidism    Migraines    Osteoporosis    Stroke (HCC) 12/2000   Thyroid disease    Hypothyroidism    Family History  Problem Relation Age of Onset   Stroke Mother    Diabetes Mother    Heart attack Father        Deceased, 17   Cancer Other        Ovarian, Uterine   Heart disease Other    Melanoma Sister    Healthy Brother    Colon cancer Neg Hx    Breast cancer Neg Hx     Social History   Socioeconomic History   Marital status: Single    Spouse name: Not on file   Number of children: 0   Years of education: 16   Highest education level: Not on file  Occupational History   Occupation: Scientist, physiological: UNEMPLOYED  Tobacco Use   Smoking status: Never Smoker   Smokeless tobacco: Never Used  Building services engineer Use: Never used  Substance and Sexual Activity   Alcohol use: No    Alcohol/week: 0.0 standard drinks    Comment: "maybe one on new years"   Drug use: No   Sexual activity: Not on file  Other Topics Concern   Not on file  Social History Narrative   Regular exercise:  Yes    Works for city of [employer] level of education:  Automotive engineer, IT consultant   Lives with roommate in a 2 story home.     No children.    Social Determinants of Health   Financial Resource Strain:    Difficulty of Paying Living Expenses: Not on file  Food Insecurity:    Worried About Programme researcher, broadcasting/film/video in the Last Year: Not on file   [employer] of Food in the Last Year: Not on file  Transportation Needs:    Lack of Transportation (Medical): Not on file   Lack of Transportation (Non-Medical): Not on file  Physical Activity:    Days of Exercise per Week: Not on file   Minutes of Exercise per Session: Not on file  Stress:    Feeling of Stress : Not on file  Social Connections:    Frequency of Communication with Friends and Family: Not on file   Frequency of Social Gatherings with Friends and  Family: Not on file   Attends Religious Services: Not on file   Active Member of Clubs or Organizations: Not on file   Attends Banker Meetings: Not on file   Marital Status: Not on file  Intimate Partner Violence:    Fear of Current or Ex-Partner: Not on file   Emotionally Abused: Not on file  Physically Abused: Not on file   Sexually Abused: Not on file    Past Medical History, Surgical history, Social history, and Family history were reviewed and updated as appropriate.   Please see review of systems for further details on the patient's review from today.   Objective:   Physical Exam:  There were no vitals taken for this visit.  Physical Exam Constitutional:      General: She is not in acute distress. Musculoskeletal:        General: No deformity.  Neurological:     Mental Status: She is alert and oriented to person, place, and time.     Coordination: Coordination normal.  Psychiatric:        Attention and Perception: Attention and perception normal. She does not perceive auditory or visual hallucinations.        Mood and Affect: Mood normal. Mood is not anxious or depressed. Affect is not labile, blunt, angry or inappropriate.        Speech: Speech normal.        Behavior: Behavior normal.        Thought Content: Thought content normal. Thought content is not paranoid or delusional. Thought content does not include homicidal or suicidal ideation. Thought content does not include homicidal or suicidal plan.        Cognition and Memory: Cognition and memory normal.        Judgment: Judgment normal.     Comments: Insight intact     Lab Review:     Component Value Date/Time   NA 140 05/27/2019 0841   K 4.4 05/27/2019 0841   CL 105 05/27/2019 0841   CO2 29 05/27/2019 0841   GLUCOSE 96 05/27/2019 0841   BUN 16 05/27/2019 0841   CREATININE 0.89 05/27/2019 0841   CREATININE 0.79 06/11/2015 1636   CALCIUM 9.5 05/27/2019 0841   PROT 6.7 05/27/2019  0841   ALBUMIN 4.2 05/27/2019 0841   AST 17 05/27/2019 0841   ALT 13 05/27/2019 0841   ALKPHOS 129 (H) 05/27/2019 0841   BILITOT 0.5 05/27/2019 0841   GFRNONAA 56 (L) 11/24/2016 0339   GFRAA >60 11/24/2016 0339       Component Value Date/Time   WBC 5.6 05/27/2019 0841   RBC 4.16 05/27/2019 0841   HGB 12.4 05/27/2019 0841   HCT 37.1 05/27/2019 0841   PLT 229.0 05/27/2019 0841   MCV 89.3 05/27/2019 0841   MCV 89.9 09/05/2014 0935   MCH 28.8 11/24/2016 0339   MCHC 33.4 05/27/2019 0841   RDW 14.2 05/27/2019 0841   LYMPHSABS 1.1 05/27/2019 0841   MONOABS 0.6 05/27/2019 0841   EOSABS 0.1 05/27/2019 0841   BASOSABS 0.0 05/27/2019 0841    No results found for: POCLITH, LITHIUM   No results found for: PHENYTOIN, PHENOBARB, VALPROATE, CBMZ   .res Assessment: Plan:    Plan:  PDMP reviewed  Continue Wellbutrin SR 150mg  BID D/C Trintellix - 5mg /10 - start at 2.5mg  daily - cramps Deplin 15mg  daily - 4 weeks of samples  Add Rexulti 0.5mg  x 7 days, then 1mg  daily - samples given - 4 weeks.  RTC 4 weeks   Patient advised to contact office with any questions, adverse effects, or acute worsening in signs and symptoms.  Discussed potential metabolic side effects associated with atypical antipsychotics, as well as potential risk for movement side effects. Advised pt to contact office if movement side effects occur.        Diagnoses and all orders for  this visit:  Generalized anxiety disorder  Major depressive disorder, recurrent episode, moderate (HCC)     Please see After Visit Summary for patient specific instructions.  Future Appointments  Date Time Provider Department Center  06/23/2020  3:40 PM Macyn Shropshire, Thereasa Solo, NP [facility] None  07/13/2020  3:00 PM Chilton Si, MD [facility]    No orders of the defined types were placed in this encounter.   -------------------------------

## 2020-06-14 ENCOUNTER — Encounter: Payer: Self-pay | Admitting: Family Medicine

## 2020-06-14 ENCOUNTER — Telehealth: Payer: Self-pay | Admitting: Family Medicine

## 2020-06-14 NOTE — Telephone Encounter (Signed)
Due to schedule constraints, I think this makes sense to keep her scheduled as is.  If she is worsening in the meantime then please let me know.  Thanks.

## 2020-06-14 NOTE — Telephone Encounter (Signed)
Pt called to make an appt and said you asked to see her by the end of the week, however, you do not have anything for the end of week since her appt has to be 30 mins.  I have scheduled her for Tuesday, 06/22/2020 at 9:30am, but didn't know if you wanted to see her sooner and open any slots for her.  Thank you!

## 2020-06-21 ENCOUNTER — Telehealth: Payer: Self-pay | Admitting: Adult Health

## 2020-06-21 NOTE — Telephone Encounter (Signed)
If it is from the rexulti, stop it.

## 2020-06-21 NOTE — Telephone Encounter (Signed)
Pt stated she is experiencing shakes in right arm and leg. She is seeing Dr Para March on tomorrow so she is discontinuing her Rexulti until after her visit with him. Please advise if this is not a wise idea.

## 2020-06-21 NOTE — Telephone Encounter (Signed)
Please review

## 2020-06-21 NOTE — Telephone Encounter (Signed)
Patient aware and will call back with an update.  

## 2020-06-22 ENCOUNTER — Ambulatory Visit: Payer: 59 | Admitting: Family Medicine

## 2020-06-22 ENCOUNTER — Encounter: Payer: Self-pay | Admitting: Family Medicine

## 2020-06-22 ENCOUNTER — Other Ambulatory Visit: Payer: Self-pay

## 2020-06-22 VITALS — BP 152/90 | HR 91 | Temp 96.2°F | Ht 66.0 in | Wt 215.5 lb

## 2020-06-22 DIAGNOSIS — R251 Tremor, unspecified: Secondary | ICD-10-CM

## 2020-06-22 LAB — COMPREHENSIVE METABOLIC PANEL
ALT: 17 U/L (ref 0–35)
AST: 18 U/L (ref 0–37)
Albumin: 4.1 g/dL (ref 3.5–5.2)
Alkaline Phosphatase: 145 U/L — ABNORMAL HIGH (ref 39–117)
BUN: 16 mg/dL (ref 6–23)
CO2: 30 mEq/L (ref 19–32)
Calcium: 9 mg/dL (ref 8.4–10.5)
Chloride: 103 mEq/L (ref 96–112)
Creatinine, Ser: 0.89 mg/dL (ref 0.40–1.20)
GFR: 67.65 mL/min (ref >60.00)
Glucose, Bld: 89 mg/dL (ref 70–99)
Potassium: 4.8 mEq/L (ref 3.5–5.1)
Sodium: 139 mEq/L (ref 135–145)
Total Bilirubin: 0.5 mg/dL (ref 0.2–1.2)
Total Protein: 6.6 g/dL (ref 6.0–8.3)

## 2020-06-22 LAB — CBC WITH DIFFERENTIAL/PLATELET
Basophils Absolute: 0.1 10*3/uL (ref 0.0–0.1)
Basophils Relative: 0.7 % (ref 0.0–3.0)
Eosinophils Absolute: 0.2 10*3/uL (ref 0.0–0.7)
Eosinophils Relative: 2.8 % (ref 0.0–5.0)
HCT: 39.4 % (ref 36.0–46.0)
Hemoglobin: 13 g/dL (ref 12.0–15.0)
Lymphocytes Relative: 16.5 % (ref 12.0–46.0)
Lymphs Abs: 1.3 10*3/uL (ref 0.7–4.0)
MCHC: 33 g/dL (ref 30.0–36.0)
MCV: 91.3 fl (ref 78.0–100.0)
Monocytes Absolute: 0.8 10*3/uL (ref 0.1–1.0)
Monocytes Relative: 9.6 % (ref 3.0–12.0)
Neutro Abs: 5.8 10*3/uL (ref 1.4–7.7)
Neutrophils Relative %: 70.4 % (ref 43.0–77.0)
Platelets: 227 10*3/uL (ref 150.0–400.0)
RBC: 4.31 Mil/uL (ref 3.87–5.11)
RDW: 14.5 % (ref 11.5–15.5)
WBC: 8.2 10*3/uL (ref 4.0–10.5)

## 2020-06-22 LAB — TSH: TSH: 7.26 u[IU]/mL — ABNORMAL HIGH (ref 0.35–4.50)

## 2020-06-22 NOTE — Patient Instructions (Signed)
Go to the lab on the way out.   If you have mychart we'll likely use that to update you.    Don't change your meds for now.  We may need to have you see Dr. Arbutus Leas.  Take care.  Glad to see you.

## 2020-06-22 NOTE — Progress Notes (Signed)
This visit occurred during the SARS-CoV-2 public health emergency.  Safety protocols were in place, including screening questions prior to the visit, additional usage of staff PPE, and extensive cleaning of exam room while observing appropriate contact time as indicated for disinfecting solutions.  She has h/o tremor.  R hand and R leg intermittently.  She is L handed.  She is off rexulti as of yesterday (2-5% rate of tremor).  Tremor extinguishes when planting the R foot or pushing down with R hand.  Tremor is usually at rest, if happening.  Currently on carvedilol.  Still on levothyroxine.  She doesn't have RLS sx at night.  She doesn't drink etoh.  She drinks some caffeine daily w/o clear effect on tremor.  No dysphagia.    Still on gabapentin.  She had back injection with some relief.  She is trying to taper gabapentin.    Osteoporosis.  We elected to defer options at this point given the tremor.  She agrees.  Weight is up.  Lap band has been loosened in the meantime.    Meds, vitals, and allergies reviewed.   ROS: Per HPI unless specifically indicated in ROS section   GEN: nad, alert and oriented HEENT: ncat NECK: supple w/o LA CV: rrr. PULM: ctab, no inc wob ABD: soft, +bs EXT: no edema SKIN: no acute rash CN 2-12 wnl B, S/S/DTR wnl x4 No tremor on exam.

## 2020-06-23 ENCOUNTER — Ambulatory Visit: Payer: 59 | Admitting: Adult Health

## 2020-06-23 NOTE — Assessment & Plan Note (Signed)
Noted intermittently by the patient in the right hand and right leg but not noted at time of exam.  She does not have swallowing changes or flat affect.  Discussed differential which includes Parkinson's, we talked about the fact that there are multiple reasons to have a tremor other than Parkinson's.  Reasonable to check labs today and if unremarkable then it may be reasonable for her to see neurology.  Rationale discussed with patient she agreed.  At this point still okay for outpatient follow-up.  See notes on labs.

## 2020-07-09 ENCOUNTER — Ambulatory Visit (INDEPENDENT_AMBULATORY_CARE_PROVIDER_SITE_OTHER): Payer: 59 | Admitting: Adult Health

## 2020-07-09 ENCOUNTER — Encounter: Payer: Self-pay | Admitting: Adult Health

## 2020-07-09 ENCOUNTER — Other Ambulatory Visit: Payer: Self-pay

## 2020-07-09 DIAGNOSIS — F331 Major depressive disorder, recurrent, moderate: Secondary | ICD-10-CM | POA: Diagnosis not present

## 2020-07-09 DIAGNOSIS — F411 Generalized anxiety disorder: Secondary | ICD-10-CM | POA: Diagnosis not present

## 2020-07-09 NOTE — Progress Notes (Signed)
Traci [name] [id number] 1956/02/11 64 y.o.  Subjective:   Patient ID:  Traci [name] is a 64 y.o. (DOB 03-10-1956) female.  Chief Complaint: No chief complaint on file.   HPI Traci [name] presents to the office today for follow-up of MDD and GAD.  Describes mood today as "no different". Pleasant. Tearful. Mood symptoms - reports depression, anxiety, and irritability. More depressed overall. Stating "in my mind, nothing is going right". Stopped the Rexulti after a week - tremors worse - right hand and right leg "shaky". PCP plans to refer her to Neurology for evaluation. Does not want to try further medications until genetic testing completed. Plans to visit a friend in [place] for Thanksgiving. Helped a friend move yesterday. Varying interest and motivation. Taking medications as prescribed.  Energy levels lower - feels tired all the time. Active, has a regular exercise routine - 3 times a week. [employer].  Enjoys some usual interests and activities. Single. Lives with a room-mate and her grandson age 47. Talking with friends.  Appetite adequate. Weight gain - 208 pounds.  Sleeps well most nights. Averages 8 hours.  Focus and concentration stable. Completing tasks. Managing aspects of household. Works for the [employer] in [employer] - 40 hours a week.  Denies SI or HI. Denies AH or VH.  Previous medication trials: Wellbutrin SR 150mg  BID - 2 years, Zoloft - did not tolerate - headaches and nauseas. Lexapro listed as a drug intolerance - sedation and fatigue. Trintellix - leg cramps and pain in arms.     PHQ2-9     Office Visit from 01/26/2020 in [facility] at [facility] Total Score 1  PHQ-9 Total Score 4       Review of Systems:  Review of Systems  Musculoskeletal: Negative for gait problem.  Neurological: Negative for tremors.  Psychiatric/Behavioral:       Please refer to HPI    Medications: I have reviewed the  patient's current medications.  Current Outpatient Medications  Medication Sig Dispense Refill  . aspirin EC 81 MG tablet Take 81 mg by mouth daily.    [facility] aspirin-acetaminophen-caffeine (EXCEDRIN MIGRAINE) 250-250-65 MG tablet Take 1 tablet by mouth every 8 (eight) hours as needed for migraine. 30 tablet 0  . Biotin 5 MG TABS Take 5 mg by mouth.    [name] buPROPion (WELLBUTRIN SR) 150 MG 12 hr tablet Take 1 tablet (150 mg total) by mouth 2 (two) times daily. 180 tablet 3  . Calcium Carb-Cholecalciferol (CALCIUM 600 + D) 600-200 MG-UNIT TABS Take 600 mg by mouth daily.    . carvedilol (COREG) 6.25 MG tablet TAKE 1 TABLET BY MOUTH TWICE DAILY WITH A MEAL 180 tablet 2  . Cholecalciferol (VITAMIN D) 2000 units tablet Take 1 tablet (2,000 Units total) by mouth daily.    . cyanocobalamin 100 MCG tablet Take 100 mcg by mouth daily.    . diclofenac (VOLTAREN) 75 MG EC tablet Take 1 tablet (75 mg total) by mouth 2 (two) times daily. (Patient not taking: Reported on 06/22/2020) 50 tablet 2  . gabapentin (NEURONTIN) 100 MG capsule Take 200 mg by mouth at bedtime.     06/24/2020 L-Methylfolate-Algae (DEPLIN 15 PO) Take 15 mg by mouth daily.    [name] levothyroxine (EUTHYROX) 112 MCG tablet TAKE 1 TABLET BY MOUTH  DAILY BEFORE  BREAKFAST 90 tablet 3  . Multiple Vitamin (MULTIVITAMIN) tablet Take 1 tablet by mouth daily.     [name] NEURONTIN  300 MG capsule Take 300 mg by mouth at bedtime.    [name] omeprazole (PRILOSEC) 20 MG capsule Take 1 capsule (20 mg total) by mouth daily. 90 capsule 3  . rosuvastatin (CRESTOR) 20 MG tablet TAKE 1 TABLET BY MOUTH  DAILY 30 tablet 3   No current facility-administered medications for this visit.    Medication Side Effects: None  Allergies:  Allergies  Allergen Reactions  . Lexapro [Escitalopram Oxalate] Other (See Comments)    Fatigue, sedation  . Meperidine Hcl Other (See Comments)    Cardiac arrest and syncope  . Simvastatin Other (See Comments)    REACTION: muscle aches  . Tape      Allergic to bandaid adhesive.    . Venlafaxine     Intolerant.  Joint aches.  . Codeine Itching  . Tramadol Itching    Past Medical History:  Diagnosis Date  . Abnormal alkaline phosphatase test    with neg eval prev  . Arthritis   . Depression    Previously responded to Wellbutrin  . Headache(784.0)   . Hyperlipidemia   . Hypothyroidism   . Migraines   . Osteoporosis   . Stroke (HCC) 12/2000  . Thyroid disease    Hypothyroidism    Family History  Problem Relation Age of Onset  . Stroke Mother   . Diabetes Mother   . Heart attack Father        Deceased, 47  . Cancer Other        Ovarian, Uterine  . Heart disease Other   . Melanoma Sister   . Healthy Brother   . Colon cancer Neg Hx   . Breast cancer Neg Hx     Social History   Socioeconomic History  . Marital status: Single    Spouse name: Not on file  . Number of children: 0  . Years of education: 42  . Highest education level: Not on file  Occupational History  . Occupation: Scientist, physiological: UNEMPLOYED  Tobacco Use  . Smoking status: Never Smoker  . Smokeless tobacco: Never Used  Vaping Use  . Vaping Use: Never used  Substance and Sexual Activity  . Alcohol use: No    Alcohol/week: 0.0 standard drinks    Comment: "maybe one on new years"  . Drug use: No  . Sexual activity: Not on file  Other Topics Concern  . Not on file  Social History Narrative   Regular exercise:  Yes    Works for [employer] level of education:  Automotive engineer, IT consultant   Lives with roommate in a 2 story home.     No children.    Social Determinants of Health   Financial Resource Strain:   . Difficulty of Paying Living Expenses: Not on file  Food Insecurity:   . Worried About Programme researcher, broadcasting/film/video in the Last Year: Not on file  . Ran Out of Food in the Last Year: Not on file  Transportation Needs:   . Lack of Transportation (Medical): Not on file  . Lack of Transportation (Non-Medical): Not on file   Physical Activity:   . Days of Exercise per Week: Not on file  . Minutes of Exercise per Session: Not on file  Stress:   . Feeling of Stress : Not on file  Social Connections:   . Frequency of Communication with Friends and Family: Not on file  . Frequency of Social Gatherings with Friends and Family: Not on  file  . Attends Religious Services: Not on file  . Active Member of Clubs or Organizations: Not on file  . Attends BankerClub or Organization Meetings: Not on file  . Marital Status: Not on file  Intimate Partner Violence:   . Fear of Current or Ex-Partner: Not on file  . Emotionally Abused: Not on file  . Physically Abused: Not on file  . Sexually Abused: Not on file    Past Medical History, Surgical history, Social history, and Family history were reviewed and updated as appropriate.   Please see review of systems for further details on the patient's review from today.   Objective:   Physical Exam:  There were no vitals taken for this visit.  Physical Exam Constitutional:      General: She is not in acute distress. Musculoskeletal:        General: No deformity.  Neurological:     Mental Status: She is alert and oriented to person, place, and time.     Coordination: Coordination normal.  Psychiatric:        Attention and Perception: Attention and perception normal. She does not perceive auditory or visual hallucinations.        Mood and Affect: Mood is depressed. Mood is not anxious. Affect is not labile, blunt, angry or inappropriate.        Speech: Speech normal.        Behavior: Behavior normal.        Thought Content: Thought content normal. Thought content is not paranoid or delusional. Thought content does not include homicidal or suicidal ideation. Thought content does not include homicidal or suicidal plan.        Cognition and Memory: Cognition and memory normal.        Judgment: Judgment normal.     Comments: Insight intact     Lab Review:     Component  Value Date/Time   NA 139 06/22/2020 1031   K 4.8 06/22/2020 1031   CL 103 06/22/2020 1031   CO2 30 06/22/2020 1031   GLUCOSE 89 06/22/2020 1031   BUN 16 06/22/2020 1031   CREATININE 0.89 06/22/2020 1031   CREATININE 0.79 06/11/2015 1636   CALCIUM 9.0 06/22/2020 1031   PROT 6.6 06/22/2020 1031   ALBUMIN 4.1 06/22/2020 1031   AST 18 06/22/2020 1031   ALT 17 06/22/2020 1031   ALKPHOS 145 (H) 06/22/2020 1031   BILITOT 0.5 06/22/2020 1031   GFRNONAA 56 (L) 11/24/2016 0339   GFRAA >60 11/24/2016 0339       Component Value Date/Time   WBC 8.2 06/22/2020 1031   RBC 4.31 06/22/2020 1031   HGB 13.0 06/22/2020 1031   HCT 39.4 06/22/2020 1031   PLT 227.0 06/22/2020 1031   MCV 91.3 06/22/2020 1031   MCV 89.9 09/05/2014 0935   MCH 28.8 11/24/2016 0339   MCHC 33.0 06/22/2020 1031   RDW 14.5 06/22/2020 1031   LYMPHSABS 1.3 06/22/2020 1031   MONOABS 0.8 06/22/2020 1031   EOSABS 0.2 06/22/2020 1031   BASOSABS 0.1 06/22/2020 1031    No results found for: POCLITH, LITHIUM   No results found for: PHENYTOIN, PHENOBARB, VALPROATE, CBMZ   .res Assessment: Plan:    Plan:  PDMP reviewed  Continue Wellbutrin SR 150mg  BID D/C Trintellix - 5mg /10 - start at 2.5mg  daily - cramps D/C Rexulti 0.5mg   Genetic testing completed and sent for analysis - will inform patient when results returned.   RTC 4 weeks   Patient advised to  contact office with any questions, adverse effects, or acute worsening in signs and symptoms.  Discussed potential metabolic side effects associated with atypical antipsychotics, as well as potential risk for movement side effects. Advised pt to contact office if movement side effects occur.       Diagnoses and all orders for this visit:  Generalized anxiety disorder  Major depressive disorder, recurrent episode, moderate (HCC)     Please see After Visit Summary for patient specific instructions.  Future Appointments  Date Time Provider Department  Center  07/13/2020  3:00 PM Chilton Si, MD [facility]  08/06/2020  4:00 PM Desmon Hitchner, Thereasa Solo, NP [facility] None  08/13/2020 10:50 AM Nita Sickle K, DO [facility] None    No orders of the defined types were placed in this encounter.   -------------------------------

## 2020-07-13 ENCOUNTER — Encounter: Payer: Self-pay | Admitting: Cardiovascular Disease

## 2020-07-13 ENCOUNTER — Ambulatory Visit: Payer: 59 | Admitting: Cardiovascular Disease

## 2020-07-13 VITALS — BP 124/86 | HR 92 | Temp 97.7°F | Ht 65.0 in | Wt 221.2 lb

## 2020-07-13 DIAGNOSIS — I2510 Atherosclerotic heart disease of native coronary artery without angina pectoris: Secondary | ICD-10-CM

## 2020-07-13 DIAGNOSIS — E785 Hyperlipidemia, unspecified: Secondary | ICD-10-CM

## 2020-07-13 DIAGNOSIS — I10 Essential (primary) hypertension: Secondary | ICD-10-CM | POA: Diagnosis not present

## 2020-07-13 HISTORY — DX: Atherosclerotic heart disease of native coronary artery without angina pectoris: I25.10

## 2020-07-13 NOTE — Progress Notes (Signed)
Cardiology Office Note   Date:  07/13/2020   ID:  Traci, [name] 1956-08-14, MRN [id number]  PCP:  Joaquim Nam, MD  Cardiologist:   Chilton Si, MD   No chief complaint on file.   History of Present Illness: Traci [name] is a 64 y.o. female with hypertension, hyperlipidemia, prior stroke, fibromyalgia, s/p gastric band, and hypothyroidism who presents for follow up.  Traci [name] was admitted to the hospital 11/21/16 with three weeks of intermittent chest pain.  Cardiac enzymes were negative.  She underwent LHC 11/22/16 and had mild luminal irregularities but no obstructive disease.  LVEF was 50-55%.  LVEDP was mildly elevated at 16-20 mmHg.  BP was poorly controlled in the hospital.  She was started on carvedilol and diuresed with IV lasix.  Her echo during that hospitalization revealed LVEF 55-60% with mild LVH and grade 1 diastolic dysfunction.  Her heart catheterization was complicated by a small R groin hematoma.  She followed up with her PCP, Dr. Para March, who wondered if she may have microvascular angina given her improvement with a beta blocker.  She continued to complain of intermittent chest pain.  Carvedilol was increased and she was started on aspirin and rosuvastatin.    Lately she can't get any exercise because of back pain.  She can't sit or stand for long periods of time.  She had an injection that didn't work.  She had her lap band adjusted because she was having difficulty swallowing water.  Now she started over eating.  She has no chest pain and her breathing is stable.  She is frustrated that she is losing weight.  She has no edema, orthopnea or PND.  Not using CPAP because she cannot tolerate it.  She sleeps on her sides and thinks that she sleeps well.   Past Medical History:  Diagnosis Date  . Abnormal alkaline phosphatase test    with neg eval prev  . Arthritis   . CAD in native artery 07/13/2020  . Depression    Previously responded to  Wellbutrin  . Headache(784.0)   . Hyperlipidemia   . Hypothyroidism   . Migraines   . Osteoporosis   . Stroke (HCC) 12/2000  . Thyroid disease    Hypothyroidism    Past Surgical History:  Procedure Laterality Date  . CHOLECYSTECTOMY     ? 20 years ago  . LAPAROSCOPIC GASTRIC BANDING  2006  . LEFT HEART CATH AND CORONARY ANGIOGRAPHY N/A 11/22/2016   Procedure: Left Heart Cath and Coronary Angiography;  Surgeon: Iran Ouch, MD;  Location: [facility];  Service: Cardiovascular;  Laterality: N/A;  . TOTAL KNEE ARTHROPLASTY Right 11/26/2012   Procedure: TOTAL KNEE ARTHROPLASTY- right ;  Surgeon: Velna Ochs, MD;  Location: [facility] OR;  Service: Orthopedics;  Laterality: Right;     Current Outpatient Medications  Medication Sig Dispense Refill  . aspirin EC 81 MG tablet Take 81 mg by mouth daily.    [name] aspirin-acetaminophen-caffeine (EXCEDRIN MIGRAINE) 250-250-65 MG tablet Take 1 tablet by mouth every 8 (eight) hours as needed for migraine. 30 tablet 0  . Biotin 5 MG TABS Take 5 mg by mouth.    [name] buPROPion (WELLBUTRIN SR) 150 MG 12 hr tablet Take 1 tablet (150 mg total) by mouth 2 (two) times daily. 180 tablet 3  . Calcium Carb-Cholecalciferol (CALCIUM 600 + D) 600-200 MG-UNIT TABS Take 600 mg by mouth daily.    . carvedilol (COREG) 6.25 MG tablet  TAKE 1 TABLET BY MOUTH TWICE DAILY WITH A MEAL 180 tablet 2  . Cholecalciferol (VITAMIN D) 2000 units tablet Take 1 tablet (2,000 Units total) by mouth daily.    . cyanocobalamin 100 MCG tablet Take 100 mcg by mouth daily.    . diclofenac (VOLTAREN) 75 MG EC tablet Take 1 tablet (75 mg total) by mouth 2 (two) times daily. 50 tablet 2  . L-Methylfolate-Algae (DEPLIN 15 PO) Take 15 mg by mouth daily.    [name] levothyroxine (EUTHYROX) 112 MCG tablet TAKE 1 TABLET BY MOUTH  DAILY BEFORE  BREAKFAST 90 tablet 3  . Multiple Vitamin (MULTIVITAMIN) tablet Take 1 tablet by mouth daily.     [name] NEURONTIN 300 MG capsule Take 300 mg by mouth at  bedtime.    [name] omeprazole (PRILOSEC) 20 MG capsule Take 1 capsule (20 mg total) by mouth daily. 90 capsule 3  . rosuvastatin (CRESTOR) 20 MG tablet TAKE 1 TABLET BY MOUTH  DAILY 30 tablet 3   No current facility-administered medications for this visit.    Allergies:   Lexapro [escitalopram oxalate], Meperidine hcl, Simvastatin, Tape, Venlafaxine, Codeine, and Tramadol    Social History:  The patient  reports that she has never smoked. She has never used smokeless tobacco. She reports that she does not drink alcohol and does not use drugs.   Family History:  The patient's family history includes Cancer in an other family member; Diabetes in her mother; Healthy in her brother; Heart attack in her father; Heart disease in an other family member; Melanoma in her sister; Stroke in her mother.    ROS:  Please see the history of present illness.   Otherwise, review of systems are positive for none.   All other systems are reviewed and negative.    PHYSICAL EXAM: VS:  BP 124/86   Pulse 92   Temp 97.7 F (36.5 C)   Ht 5\' 5"  (1.651 m)   Wt 221 lb 3.2 oz (100.3 kg)   SpO2 98%   BMI 36.81 kg/m  , BMI Body mass index is 36.81 kg/m. GENERAL:  Well appearing HEENT: Pupils equal round and reactive, fundi not visualized, oral mucosa unremarkable NECK:  No jugular venous distention, waveform within normal limits, carotid upstroke brisk and symmetric, no bruits LUNGS:  Clear to auscultation bilaterally HEART:  RRR.  PMI not displaced or sustained,S1 and S2 within normal limits, no S3, no S4, no clicks, no rubs, no murmurs ABD:  Flat, positive bowel sounds normal in frequency in pitch, no bruits, no rebound, no guarding, no midline pulsatile mass, no hepatomegaly, no splenomegaly EXT:  2 plus pulses throughout, no edema, no cyanosis no clubbing SKIN:  No rashes no nodules NEURO:  Cranial nerves II through XII grossly intact, motor grossly intact throughout PSYCH:  Cognitively intact, oriented to  person place and time  EKG:  EKG is not ordered today. The ekg ordered 11/21/16 demonstrates sinus rhythm. Rate 91 bpm. 07/30/17: Sinus rhythm.  Rate 81 bpm.  01/29/18: Sinus rhythm.  Rate 78 bpm.    LHC 11/22/16  The left ventricular systolic function is normal.  LV end diastolic pressure is mildly elevated.  The left ventricular ejection fraction is 50-55% by visual estimate.  1. Minor luminal irregularities with no evidence of obstructive coronary artery disease. 2. Low normal LV systolic function with an EF of 50-55%. Mildly elevated left ventricular end-diastolic pressure (LVEDP between 16-20 mmHg).  Echo 11/22/16: Study Conclusions  - Left ventricle: The cavity size  was normal. Wall thickness was   increased in a pattern of mild LVH. Systolic function was normal.   The estimated ejection fraction was in the range of 55% to 60%.   Wall motion was normal; there were no regional wall motion   abnormalities. Doppler parameters are consistent with abnormal   left ventricular relaxation (grade 1 diastolic dysfunction). - Aortic valve: There was no stenosis. - Mitral valve: There was no significant regurgitation. - Left atrium: The atrium was mildly dilated. - Right ventricle: The cavity size was normal. Systolic function   was normal. - Pulmonary arteries: No complete TR doppler jet so unable to   estimate PA systolic pressure. - Inferior vena cava: The vessel was normal in size. The   respirophasic diameter changes were in the normal range (>= 50%),   consistent with normal central venous pressure.  Impressions:  - Normal LV size with mild LV hypertrophy. EF 55-60%. Normal RV   size and systolic function. No significant valvular   abnormalities.   Recent Labs: 06/22/2020: ALT 17; BUN 16; Creatinine, Ser 0.89; Hemoglobin 13.0; Platelets 227.0; Potassium 4.8; Sodium 139; TSH 7.26    Lipid Panel    Component Value Date/Time   CHOL 126 01/26/2020 1302   TRIG 170.0  (H) 01/26/2020 1302   HDL 35.10 (L) 01/26/2020 1302   CHOLHDL 4 01/26/2020 1302   VLDL 34.0 01/26/2020 1302   LDLCALC 57 01/26/2020 1302   LDLDIRECT 103.6 07/17/2011 0920      Wt Readings from Last 3 Encounters:  07/13/20 221 lb 3.2 oz (100.3 kg)  06/22/20 215 lb 8 oz (97.8 kg)  04/22/20 207 lb 9 oz (94.1 kg)      ASSESSMENT AND PLAN:  # Hypertensive heart disease:  Blood pressure is elevated in office today.  She has been gaining weight and not exercising.  She struggling with back pain.  She is going to get an injection in hopes to be able to start moving more soon.  I recommended that she consider water aerobics.  Continue carvedilol.  She will track her blood pressures at least a few times a week and bring to follow-up.  If her blood pressure remains elevated we will need to adjust her regimen.  # Non-obstructive CAD: # Chest pain: # Chronic diastolic dysfunction: Chest pain resolved.  She has shortness of breath on exertion when walking long distances but is able to exercise at the gym w ithout symptoms.  She has no heart failure symptoms and appears euvolemic on exam.    # Hyperlipidemia: LDL was 57 on 01/2020.  Triglycerides were elevated.  Will reassess at follow-up.  Consider Vascepa if triglycerides are greater than 150.   Current medicines are reviewed at length with the patient today.  The patient does not have concerns regarding medicines.  The following changes have been made: none  Labs/ tests ordered today include:   No orders of the defined types were placed in this encounter.    Disposition:   FU with Filmore Molyneux C. Duke Salvia, MD, [facility] in 3 months    Signed, Yassine Brunsman C. Duke Salvia, MD, [facility]  07/13/2020 5:07 PM    [facility]

## 2020-07-13 NOTE — Patient Instructions (Addendum)
Medication Instructions:  Your physician recommends that you continue on your current medications as directed. Please refer to the Current Medication list given to you today.  *If you need a refill on your cardiac medications before your next appointment, please call your pharmacy*  Lab Work: NONE   Testing/Procedures: NONE   Follow-Up: At [facility], you and your health needs are our priority.  As part of our continuing mission to provide you with exceptional heart care, we have created designated Provider Care Teams.  These Care Teams include your primary Cardiologist (physician) and Advanced Practice Providers (APPs -  Physician Assistants and Nurse Practitioners) who all work together to provide you with the care you need, when you need it.  We recommend signing up for the patient portal called "MyChart".  Sign up information is provided on this After Visit Summary.  MyChart is used to connect with patients for Virtual Visits (Telemedicine).  Patients are able to view lab/test results, encounter notes, upcoming appointments, etc.  Non-urgent messages can be sent to your provider as well.   To learn more about what you can do with MyChart, go to [email or web address].    Your next appointment:   3 month(s)  The format for your next appointment:   In Person  Provider:   You may see Chilton Si, MD  or one of the following Advanced Practice Providers on your designated Care Team:    Corine Shelter, PA-C  [place], [place]  Edd Fabian, [place]  Other Instructions  MONITOR YOUR BLOOD PRESSURE AND BRING YOUR READINGS TO YOUR FOLLOW UP APPOINTMENT   WORK HARDER ON EXERCISING

## 2020-07-27 ENCOUNTER — Other Ambulatory Visit: Payer: Self-pay | Admitting: Cardiovascular Disease

## 2020-08-06 ENCOUNTER — Other Ambulatory Visit: Payer: Self-pay

## 2020-08-06 ENCOUNTER — Encounter: Payer: Self-pay | Admitting: Adult Health

## 2020-08-06 ENCOUNTER — Ambulatory Visit (INDEPENDENT_AMBULATORY_CARE_PROVIDER_SITE_OTHER): Payer: 59 | Admitting: Adult Health

## 2020-08-06 DIAGNOSIS — F411 Generalized anxiety disorder: Secondary | ICD-10-CM

## 2020-08-06 DIAGNOSIS — F331 Major depressive disorder, recurrent, moderate: Secondary | ICD-10-CM

## 2020-08-06 NOTE — Progress Notes (Signed)
Traci [name] [id number] 1955/10/01 64 y.o.  Subjective:   Patient ID:  Traci [name] is a 64 y.o. (DOB [birth year]) female.  Chief Complaint: No chief complaint on file.   HPI Traci [name] presents to the office today for follow-up of MDD and GAD.  Describes mood today as "no improvement". Pleasant. Tearful - "very". Mood symptoms - reports depression, anxiety, and irritability. More depressed overall. Stating "nothing is any better". Roommate hospitalized with bone cancer - currently hospitalized. Has a neurology appointment on December 10th. Has received genetic testing and is willing to try the Viibryd for mood symptoms. Visited a friend in [place] for Thanksgiving. Varying interest and motivation. Taking medications as prescribed.  Energy levels low - "I feel tired". Active, has a regular exercise routine - 3 times a week. [employer].  Enjoys some usual interests and activities. Single. Lives with a room-mate and her grandson age 21. Talking with friends.  Appetite adequate. Weight stable - 208 pounds.  Sleeps well most nights. Averages 8 hours.  Focus and concentration stable. Completing tasks. Managing aspects of household. Works for the [employer] in [place] - 40 hours a week.  Denies SI or HI. Denies AH or VH.  Previous medication trials: Wellbutrin SR 150mg  BID - 2 years, Zoloft - did not tolerate - headaches and nauseas. Lexapro listed as a drug intolerance - sedation and fatigue. Trintellix - leg cramps and pain in arms.       PHQ2-9     Office Visit from 01/26/2020 in [facility] Total Score 1  PHQ-9 Total Score 4       Review of Systems:  Review of Systems  Musculoskeletal: Negative for gait problem.  Neurological: Negative for tremors.  Psychiatric/Behavioral:       Please refer to HPI    Medications: I have reviewed the patient's current medications.  Current Outpatient Medications   Medication Sig Dispense Refill  . aspirin EC 81 MG tablet Take 81 mg by mouth daily.    [facility] aspirin-acetaminophen-caffeine (EXCEDRIN MIGRAINE) 250-250-65 MG tablet Take 1 tablet by mouth every 8 (eight) hours as needed for migraine. 30 tablet 0  . Biotin 5 MG TABS Take 5 mg by mouth.    [name] buPROPion (WELLBUTRIN SR) 150 MG 12 hr tablet Take 1 tablet (150 mg total) by mouth 2 (two) times daily. 180 tablet 3  . Calcium Carb-Cholecalciferol (CALCIUM 600 + D) 600-200 MG-UNIT TABS Take 600 mg by mouth daily.    . carvedilol (COREG) 6.25 MG tablet TAKE 1 TABLET BY MOUTH TWICE DAILY WITH A MEAL 180 tablet 2  . Cholecalciferol (VITAMIN D) 2000 units tablet Take 1 tablet (2,000 Units total) by mouth daily.    . cyanocobalamin 100 MCG tablet Take 100 mcg by mouth daily.    . diclofenac (VOLTAREN) 75 MG EC tablet Take 1 tablet (75 mg total) by mouth 2 (two) times daily. 50 tablet 2  . L-Methylfolate-Algae (DEPLIN 15 PO) Take 15 mg by mouth daily.    [name] levothyroxine (EUTHYROX) 112 MCG tablet TAKE 1 TABLET BY MOUTH  DAILY BEFORE  BREAKFAST 90 tablet 3  . Multiple Vitamin (MULTIVITAMIN) tablet Take 1 tablet by mouth daily.     [name] NEURONTIN 300 MG capsule Take 300 mg by mouth at bedtime.    [name] omeprazole (PRILOSEC) 20 MG capsule Take 1 capsule (20 mg total) by mouth daily. 90 capsule 3  . rosuvastatin (CRESTOR) 20 MG tablet  TAKE 1 TABLET BY MOUTH  DAILY 90 tablet 3   No current facility-administered medications for this visit.    Medication Side Effects: None  Allergies:  Allergies  Allergen Reactions  . Lexapro [Escitalopram Oxalate] Other (See Comments)    Fatigue, sedation  . Meperidine Hcl Other (See Comments)    Cardiac arrest and syncope  . Simvastatin Other (See Comments)    REACTION: muscle aches  . Tape     Allergic to bandaid adhesive.    . Venlafaxine     Intolerant.  Joint aches.  . Codeine Itching  . Tramadol Itching    Past Medical History:  Diagnosis Date  . Abnormal alkaline  phosphatase test    with neg eval prev  . Arthritis   . CAD in native artery 07/13/2020  . Depression    Previously responded to Wellbutrin  . Headache(784.0)   . Hyperlipidemia   . Hypothyroidism   . Migraines   . Osteoporosis   . Stroke (HCC) 12/2000  . Thyroid disease    Hypothyroidism    Family History  Problem Relation Age of Onset  . Stroke Mother   . Diabetes Mother   . Heart attack Father        Deceased, 16  . Cancer Other        Ovarian, Uterine  . Heart disease Other   . Melanoma Sister   . Healthy Brother   . Colon cancer Neg Hx   . Breast cancer Neg Hx     Social History   Socioeconomic History  . Marital status: Single    Spouse name: Not on file  . Number of children: 0  . Years of education: 50  . Highest education level: Not on file  Occupational History  . Occupation: Scientist, physiological: UNEMPLOYED  Tobacco Use  . Smoking status: Never Smoker  . Smokeless tobacco: Never Used  Vaping Use  . Vaping Use: Never used  Substance and Sexual Activity  . Alcohol use: No    Alcohol/week: 0.0 standard drinks    Comment: "maybe one on new years"  . Drug use: No  . Sexual activity: Not on file  Other Topics Concern  . Not on file  Social History Narrative   Regular exercise:  Yes    Works for city of [employer] level of education:  Automotive engineer, IT consultant   Lives with roommate in a 2 story home.     No children.    Social Determinants of Health   Financial Resource Strain:   . Difficulty of Paying Living Expenses: Not on file  Food Insecurity:   . Worried About Programme researcher, broadcasting/film/video in the Last Year: Not on file  . Ran Out of Food in the Last Year: Not on file  Transportation Needs:   . Lack of Transportation (Medical): Not on file  . Lack of Transportation (Non-Medical): Not on file  Physical Activity:   . Days of Exercise per Week: Not on file  . Minutes of Exercise per Session: Not on file  Stress:   . Feeling of Stress : Not on  file  Social Connections:   . Frequency of Communication with Friends and Family: Not on file  . Frequency of Social Gatherings with Friends and Family: Not on file  . Attends Religious Services: Not on file  . Active Member of Clubs or Organizations: Not on file  . Attends Banker Meetings: Not on file  .  Marital Status: Not on file  Intimate Partner Violence:   . Fear of Current or Ex-Partner: Not on file  . Emotionally Abused: Not on file  . Physically Abused: Not on file  . Sexually Abused: Not on file    Past Medical History, Surgical history, Social history, and Family history were reviewed and updated as appropriate.   Please see review of systems for further details on the patient's review from today.   Objective:   Physical Exam:  There were no vitals taken for this visit.  Physical Exam Constitutional:      General: She is not in acute distress. Musculoskeletal:        General: No deformity.  Neurological:     Mental Status: She is alert and oriented to person, place, and time.     Coordination: Coordination normal.  Psychiatric:        Attention and Perception: Attention and perception normal. She does not perceive auditory or visual hallucinations.        Mood and Affect: Mood is anxious and depressed. Affect is not labile, blunt, angry or inappropriate.        Speech: Speech normal.        Behavior: Behavior is not agitated.        Thought Content: Thought content normal. Thought content is not paranoid or delusional. Thought content does not include homicidal or suicidal ideation. Thought content does not include homicidal or suicidal plan.        Cognition and Memory: Cognition and memory normal.        Judgment: Judgment normal.     Comments: Insight intact     Lab Review:     Component Value Date/Time   NA 139 06/22/2020 1031   K 4.8 06/22/2020 1031   CL 103 06/22/2020 1031   CO2 30 06/22/2020 1031   GLUCOSE 89 06/22/2020 1031   BUN 16  06/22/2020 1031   CREATININE 0.89 06/22/2020 1031   CREATININE 0.79 06/11/2015 1636   CALCIUM 9.0 06/22/2020 1031   PROT 6.6 06/22/2020 1031   ALBUMIN 4.1 06/22/2020 1031   AST 18 06/22/2020 1031   ALT 17 06/22/2020 1031   ALKPHOS 145 (H) 06/22/2020 1031   BILITOT 0.5 06/22/2020 1031   GFRNONAA 56 (L) 11/24/2016 0339   GFRAA >60 11/24/2016 0339       Component Value Date/Time   WBC 8.2 06/22/2020 1031   RBC 4.31 06/22/2020 1031   HGB 13.0 06/22/2020 1031   HCT 39.4 06/22/2020 1031   PLT 227.0 06/22/2020 1031   MCV 91.3 06/22/2020 1031   MCV 89.9 09/05/2014 0935   MCH 28.8 11/24/2016 0339   MCHC 33.0 06/22/2020 1031   RDW 14.5 06/22/2020 1031   LYMPHSABS 1.3 06/22/2020 1031   MONOABS 0.8 06/22/2020 1031   EOSABS 0.2 06/22/2020 1031   BASOSABS 0.1 06/22/2020 1031    No results found for: POCLITH, LITHIUM   No results found for: PHENYTOIN, PHENOBARB, VALPROATE, CBMZ   .res Assessment: Plan:   Plan:  PDMP reviewed  Continue Wellbutrin SR 150mg  BID Add Viibryd 10mg  daily x 7 days, then increase to 20mg  daily - samples given.  Genetic testing reviewed.  RTC 4 weeks   Patient advised to contact office with any questions, adverse effects, or acute worsening in signs and symptoms.  Discussed potential metabolic side effects associated with atypical antipsychotics, as well as potential risk for movement side effects. Advised pt to contact office if movement side effects occur.  Diagnoses and all orders for this visit:  Generalized anxiety disorder  Major depressive disorder, recurrent episode, moderate (HCC)     Please see After Visit Summary for patient specific instructions.  Future Appointments  Date Time Provider Department Center  08/13/2020 10:50 AM Nita SicklePatel, Donika K, DO [facility] None  09/10/2020  4:00 PM Carleena Mires, Thereasa Soloegina Nattalie, NP [facility] None  10/22/2020  8:20 AM Chilton Siandolph, Tiffany, MD [facility]    No orders of the defined types were placed in  this encounter.   -------------------------------

## 2020-08-13 ENCOUNTER — Other Ambulatory Visit: Payer: Self-pay

## 2020-08-13 ENCOUNTER — Ambulatory Visit: Payer: 59 | Admitting: Neurology

## 2020-08-13 ENCOUNTER — Encounter: Payer: Self-pay | Admitting: Neurology

## 2020-08-13 VITALS — BP 132/81 | HR 81 | Ht 65.0 in | Wt 222.0 lb

## 2020-08-13 DIAGNOSIS — G251 Drug-induced tremor: Secondary | ICD-10-CM | POA: Diagnosis not present

## 2020-08-13 NOTE — Progress Notes (Signed)
Follow-up Visit   Date: 08/13/20   Traci [name] MRN: [id number] DOB: June 26, 1956   Interim History: Traci [name] is a 64 y.o. left-handed Caucasian female with hypothyroidism, depression, hyperlipidemia, prior stroke, and heart failure returning to the clinic with new complaints of tremor.  The patient was accompanied to the clinic by self.  In October, she started Rexulti, atypical antipsychotic, for depression.  She was on it two weeks and began having right hand tremor and leg tremor, sometimes so prominent that she would need to sit on her hand.  She had noticed reduced intensity of the tremor, such that it does not flap as hard, but now has a constant low-amplitude tremor of the hand.  By the end of her day, her arm can be achy and sore from the movement.  She denies any cramps, weakness, or other abnormal movements.  She was previously seen here for hand writing changes and bilateral arm pain. Previously, she underwent extensive work-up including EMG, MRI brain/C-spine/L-spine, and serology testing.  EMG showed bilateral C8 radiculopathy and left L3-4 radiculopathy, which was also evidence on imaging.  Laboratory testing including inflammatory/autoimmune diseases, nutritional deficiencies, myasthenia gravis, and myopathy has been unremarkable (see below).  She also was evaluated by Dr. Nickola Major in Rheumatology whose work-up was negative.  She completed neck physiotherapy, without any improvement.    Medications:  Current Outpatient Medications on File Prior to Visit  Medication Sig Dispense Refill  . aspirin EC 81 MG tablet Take 81 mg by mouth daily.    [name] aspirin-acetaminophen-caffeine (EXCEDRIN MIGRAINE) 250-250-65 MG tablet Take 1 tablet by mouth every 8 (eight) hours as needed for migraine. 30 tablet 0  . Biotin 5 MG TABS Take 5 mg by mouth.    [name] buPROPion (WELLBUTRIN SR) 150 MG 12 hr tablet Take 1 tablet (150 mg total) by mouth 2 (two) times daily. 180 tablet 3  .  Calcium Carb-Cholecalciferol (CALCIUM 600 + D) 600-200 MG-UNIT TABS Take 600 mg by mouth daily.    . carvedilol (COREG) 6.25 MG tablet TAKE 1 TABLET BY MOUTH TWICE DAILY WITH A MEAL 180 tablet 2  . Cholecalciferol (VITAMIN D) 2000 units tablet Take 1 tablet (2,000 Units total) by mouth daily.    . cyanocobalamin 100 MCG tablet Take 100 mcg by mouth daily.    . diclofenac (VOLTAREN) 75 MG EC tablet Take 1 tablet (75 mg total) by mouth 2 (two) times daily. 50 tablet 2  . L-Methylfolate-Algae (DEPLIN 15 PO) Take 15 mg by mouth daily.    [name] levothyroxine (EUTHYROX) 112 MCG tablet TAKE 1 TABLET BY MOUTH  DAILY BEFORE  BREAKFAST 90 tablet 3  . Multiple Vitamin (MULTIVITAMIN) tablet Take 1 tablet by mouth daily.     [name] NEURONTIN 300 MG capsule Take 300 mg by mouth at bedtime.    [name] omeprazole (PRILOSEC) 20 MG capsule Take 1 capsule (20 mg total) by mouth daily. 90 capsule 3  . rosuvastatin (CRESTOR) 20 MG tablet TAKE 1 TABLET BY MOUTH  DAILY 90 tablet 3   No current facility-administered medications on file prior to visit.    Allergies:  Allergies  Allergen Reactions  . Lexapro [Escitalopram Oxalate] Other (See Comments)    Fatigue, sedation  . Meperidine Hcl Other (See Comments)    Cardiac arrest and syncope  . Simvastatin Other (See Comments)    REACTION: muscle aches  . Tape     Allergic to bandaid adhesive.    . Venlafaxine  Intolerant.  Joint aches.  . Codeine Itching  . Tramadol Itching    Vital Signs:  There were no vitals taken for this visit.  Neurological Exam: MENTAL STATUS including orientation to time, place, person, recent and remote memory, attention span and concentration, language, and fund of knowledge is normal.  Speech is not dysarthric.  CRANIAL NERVES:  No visual field defects.  Pupils equal round and reactive to light.  Normal conjugate, extra-ocular eye movements in all directions of gaze.  No ptosis   MOTOR:  Motor strength is 5/5 in all extremities. Low  amplitude, high frequency resting hand tremor.  Dissipates with action.   No pronator drift.  Tone is normal.    MSRs:  Reflexes are 2+/4 throughout.  SENSORY:  Intact to vibration throughout.  COORDINATION/GAIT:  Normal finger-to- nose-finger.  Intact rapid alternating movements bilaterally.  Gait narrow based and stable.   Data: n/a  IMPRESSION/PLAN: Antipsychotic-induced tremor as a side effect of Rexulti.  Discussed that it may take several month for symptoms to improve.  Conservative strategies such as exercise, distraction, and hand weights discussed. She will follow-up with me in 4 months.  If symptoms persist, consider DaT scan.   Thank you for allowing me to participate in patient's care.  If I can answer any additional questions, I would be pleased to do so.    Sincerely,    Tyon Cerasoli K. Allena Katz, DO

## 2020-08-13 NOTE — Patient Instructions (Signed)
Return to clinic in 4 months.

## 2020-08-23 ENCOUNTER — Other Ambulatory Visit: Payer: Self-pay | Admitting: Adult Health

## 2020-08-23 ENCOUNTER — Telehealth: Payer: Self-pay | Admitting: Adult Health

## 2020-08-23 DIAGNOSIS — F331 Major depressive disorder, recurrent, moderate: Secondary | ICD-10-CM

## 2020-08-23 DIAGNOSIS — F411 Generalized anxiety disorder: Secondary | ICD-10-CM

## 2020-08-23 MED ORDER — VIIBRYD 20 MG PO TABS: 20.0000 mg | ORAL_TABLET | Freq: Every day | ORAL | 5 refills | Status: DC

## 2020-08-23 NOTE — Telephone Encounter (Signed)
Traci [name] called because you gave her some samples of Viibryd and is only has one left.  She is now taking 20mg .  Please call in a prescription.  It is working well.  Send to on [employer].

## 2020-08-23 NOTE — Telephone Encounter (Signed)
Script sent  

## 2020-09-10 ENCOUNTER — Other Ambulatory Visit: Payer: Self-pay

## 2020-09-10 ENCOUNTER — Ambulatory Visit (INDEPENDENT_AMBULATORY_CARE_PROVIDER_SITE_OTHER): Payer: 59 | Admitting: Adult Health

## 2020-09-10 ENCOUNTER — Encounter: Payer: Self-pay | Admitting: Adult Health

## 2020-09-10 DIAGNOSIS — F411 Generalized anxiety disorder: Secondary | ICD-10-CM

## 2020-09-10 DIAGNOSIS — F331 Major depressive disorder, recurrent, moderate: Secondary | ICD-10-CM

## 2020-09-10 MED ORDER — VIIBRYD 20 MG PO TABS: 20.0000 mg | ORAL_TABLET | Freq: Every day | ORAL | 3 refills | Status: DC

## 2020-09-10 NOTE — Progress Notes (Signed)
Traci [name] [id number] 11/26/1955 65 y.o.  Subjective:   Patient ID:  Traci [name] is a 65 y.o. (DOB 1956-01-26) female.  Chief Complaint: No chief complaint on file.   HPI Loney Peto presents to the office today for follow-up of MDD and GAD.  Describes mood today as "ok". Pleasant. Tearful at times - "not like it was".  Mood symptoms - decreased depression, anxiety, and irritability. Stating "it's still there, but better". Also stating "I made it through Christmas ok". Roommate has been moved to a nursing home indefinitely - bone cancer. Her 52 year old grandson is still there with her. Recovering from a "head cold". Visited a friend in [place] over Christmas. Varying interest and motivation. Taking medications as prescribed.  Energy levels low. Active, has a regular exercise routine - 3 times a week - has not gone in a few weeks with increased covid cases. Enjoys some usual interests and activities. Single. Lives with room-mate's grandson age 65. Talking with friends.  Appetite adequate. Weight stable - 218 pounds.  Sleeps well most nights. Averages 8 hours.  Focus and concentration stable. Completing tasks. Managing aspects of household. Works for the [employer] in [place] - 40 hours a week.  Denies SI or HI.  Denies AH or VH.  Previous medication trials: Wellbutrin SR 150mg  BID - 2 years, Zoloft - did not tolerate - headaches and nauseas. Lexapro listed as a drug intolerance - sedation and fatigue. Trintellix - leg cramps and pain in arms.     PHQ2-9   Flowsheet Row Office Visit from 01/26/2020 in [facility] Total Score 1  PHQ-9 Total Score 4       Review of Systems:  Review of Systems  Musculoskeletal: Negative for gait problem.  Neurological: Negative for tremors.  Psychiatric/Behavioral:       Please refer to HPI    Medications: I have reviewed the patient's current medications.  Current  Outpatient Medications  Medication Sig Dispense Refill  . aspirin EC 81 MG tablet Take 81 mg by mouth daily.    [facility] aspirin-acetaminophen-caffeine (EXCEDRIN MIGRAINE) 250-250-65 MG tablet Take 1 tablet by mouth every 8 (eight) hours as needed for migraine. 30 tablet 0  . Biotin 5 MG TABS Take 5 mg by mouth.    [name] buPROPion (WELLBUTRIN SR) 150 MG 12 hr tablet Take 1 tablet (150 mg total) by mouth 2 (two) times daily. 180 tablet 3  . Calcium Carb-Cholecalciferol (CALCIUM 600 + D) 600-200 MG-UNIT TABS Take 600 mg by mouth daily.    . carvedilol (COREG) 6.25 MG tablet TAKE 1 TABLET BY MOUTH TWICE DAILY WITH A MEAL 180 tablet 2  . Cholecalciferol (VITAMIN D) 2000 units tablet Take 1 tablet (2,000 Units total) by mouth daily.    . cyanocobalamin 100 MCG tablet Take 100 mcg by mouth daily.    . diclofenac (VOLTAREN) 75 MG EC tablet Take 1 tablet (75 mg total) by mouth 2 (two) times daily. 50 tablet 2  . L-Methylfolate-Algae (DEPLIN 15 PO) Take 15 mg by mouth daily.    [name] levothyroxine (EUTHYROX) 112 MCG tablet TAKE 1 TABLET BY MOUTH  DAILY BEFORE  BREAKFAST 90 tablet 3  . Multiple Vitamin (MULTIVITAMIN) tablet Take 1 tablet by mouth daily.    [name] NEURONTIN 300 MG capsule Take 300 mg by mouth at bedtime.    [name] omeprazole (PRILOSEC) 20 MG capsule Take 1 capsule (20 mg total) by mouth daily. 90  capsule 3  . rosuvastatin (CRESTOR) 20 MG tablet TAKE 1 TABLET BY MOUTH  DAILY 90 tablet 3  . Vilazodone HCl (VIIBRYD) 20 MG TABS Take 1 tablet (20 mg total) by mouth daily. 90 tablet 3   No current facility-administered medications for this visit.    Medication Side Effects: None  Allergies:  Allergies  Allergen Reactions  . Lexapro [Escitalopram Oxalate] Other (See Comments)    Fatigue, sedation  . Meperidine Hcl Other (See Comments)    Cardiac arrest and syncope  . Simvastatin Other (See Comments)    REACTION: muscle aches  . Tape     Allergic to bandaid adhesive.    . Venlafaxine     Intolerant.  Joint  aches.  . Codeine Itching  . Tramadol Itching    Past Medical History:  Diagnosis Date  . Abnormal alkaline phosphatase test    with neg eval prev  . Arthritis   . CAD in native artery 07/13/2020  . Depression    Previously responded to Wellbutrin  . Headache(784.0)   . Hyperlipidemia   . Hypothyroidism   . Migraines   . Osteoporosis   . Stroke ([place]) 12/2000  . Thyroid disease    Hypothyroidism    Family History  Problem Relation Age of Onset  . Stroke Mother   . Diabetes Mother   . Heart attack Father        Deceased, 41  . Cancer Other        Ovarian, Uterine  . Heart disease Other   . Melanoma Sister   . Healthy Brother   . Colon cancer Neg Hx   . Breast cancer Neg Hx     Social History   Socioeconomic History  . Marital status: Single    Spouse name: Not on file  . Number of children: 0  . Years of education: 45  . Highest education level: Not on file  Occupational History  . Occupation: Office manager: UNEMPLOYED  Tobacco Use  . Smoking status: Never Smoker  . Smokeless tobacco: Never Used  Vaping Use  . Vaping Use: Never used  Substance and Sexual Activity  . Alcohol use: No    Alcohol/week: 0.0 standard drinks    Comment: "maybe one on new years"  . Drug use: No  . Sexual activity: Not on file  Other Topics Concern  . 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     No children.    Social Determinants of Health   Financial Resource Strain: Not on file  Food Insecurity: Not on file  Transportation Needs: Not on file  Physical Activity: Not on file  Stress: Not on file  Social Connections: Not on file  Intimate Partner Violence: Not on file    Past Medical History, Surgical history, Social history, and Family history were reviewed and updated as appropriate.   Please see review of  systems for further details on the patient's review from today.   Objective:   Physical Exam:  There were no vitals taken for this visit.  Physical Exam Constitutional:      General: She is not in acute distress. Musculoskeletal:        General: No deformity.  Neurological:     Mental Status: She is alert and oriented to  person, place, and time.     Coordination: Coordination normal.  Psychiatric:        Attention and Perception: Attention and perception normal. She does not perceive auditory or visual hallucinations.        Mood and Affect: Mood normal. Mood is not anxious or depressed. Affect is not labile, blunt, angry or inappropriate.        Speech: Speech normal.        Behavior: Behavior normal.        Thought Content: Thought content normal. Thought content is not paranoid or delusional. Thought content does not include homicidal or suicidal ideation. Thought content does not include homicidal or suicidal plan.        Cognition and Memory: Cognition and memory normal.        Judgment: Judgment normal.     Comments: Insight intact     Lab Review:     Component Value Date/Time   NA 139 06/22/2020 1031   K 4.8 06/22/2020 1031   CL 103 06/22/2020 1031   CO2 30 06/22/2020 1031   GLUCOSE 89 06/22/2020 1031   BUN 16 06/22/2020 1031   CREATININE 0.89 06/22/2020 1031   CREATININE 0.79 06/11/2015 1636   CALCIUM 9.0 06/22/2020 1031   PROT 6.6 06/22/2020 1031   ALBUMIN 4.1 06/22/2020 1031   AST 18 06/22/2020 1031   ALT 17 06/22/2020 1031   ALKPHOS 145 (H) 06/22/2020 1031   BILITOT 0.5 06/22/2020 1031   GFRNONAA 56 (L) 11/24/2016 0339   GFRAA >60 11/24/2016 0339       Component Value Date/Time   WBC 8.2 06/22/2020 1031   RBC 4.31 06/22/2020 1031   HGB 13.0 06/22/2020 1031   HCT 39.4 06/22/2020 1031   PLT 227.0 06/22/2020 1031   MCV 91.3 06/22/2020 1031   MCV 89.9 09/05/2014 0935   MCH 28.8 11/24/2016 0339   MCHC 33.0 06/22/2020 1031   RDW 14.5 06/22/2020 1031    LYMPHSABS 1.3 06/22/2020 1031   MONOABS 0.8 06/22/2020 1031   EOSABS 0.2 06/22/2020 1031   BASOSABS 0.1 06/22/2020 1031    No results found for: POCLITH, LITHIUM   No results found for: PHENYTOIN, PHENOBARB, VALPROATE, CBMZ   .res Assessment: Plan:    Plan:  PDMP reviewed  Continue Wellbutrin SR 150mg  BID Viibryd 20mg  daily  RTC 6 months  Patient advised to contact office with any questions, adverse effects, or acute worsening in signs and symptoms.  Discussed potential metabolic side effects associated with atypical antipsychotics, as well as potential risk for movement side effects. Advised pt to contact office if movement side effects occur.    Diagnoses and all orders for this visit:  Generalized anxiety disorder -     Vilazodone HCl (VIIBRYD) 20 MG TABS; Take 1 tablet (20 mg total) by mouth daily.  Major depressive disorder, recurrent episode, moderate (HCC) -     Vilazodone HCl (VIIBRYD) 20 MG TABS; Take 1 tablet (20 mg total) by mouth daily.     Please see After Visit Summary for patient specific instructions.  Future Appointments  Date Time Provider Department Center  10/22/2020  8:20 AM , MD [facility]  12/20/2020  3:30 PM [facility] K, DO [facility] None    No orders of the defined types were placed in this encounter.   -------------------------------

## 2020-09-25 ENCOUNTER — Encounter (INDEPENDENT_AMBULATORY_CARE_PROVIDER_SITE_OTHER): Payer: Self-pay

## 2020-10-04 ENCOUNTER — Encounter: Payer: Self-pay | Admitting: Family Medicine

## 2020-10-06 ENCOUNTER — Other Ambulatory Visit: Payer: Self-pay | Admitting: Family Medicine

## 2020-10-06 DIAGNOSIS — M810 Age-related osteoporosis without current pathological fracture: Secondary | ICD-10-CM

## 2020-10-13 ENCOUNTER — Other Ambulatory Visit: Payer: Self-pay

## 2020-10-13 ENCOUNTER — Other Ambulatory Visit (INDEPENDENT_AMBULATORY_CARE_PROVIDER_SITE_OTHER): Payer: 59

## 2020-10-13 DIAGNOSIS — M810 Age-related osteoporosis without current pathological fracture: Secondary | ICD-10-CM | POA: Diagnosis not present

## 2020-10-13 LAB — BASIC METABOLIC PANEL
BUN: 21 mg/dL (ref 6–23)
CO2: 27 mEq/L (ref 19–32)
Calcium: 9.2 mg/dL (ref 8.4–10.5)
Chloride: 105 mEq/L (ref 96–112)
Creatinine, Ser: 1.06 mg/dL (ref 0.40–1.20)
GFR: 54.91 mL/min — ABNORMAL LOW (ref >60.00)
Glucose, Bld: 95 mg/dL (ref 70–99)
Potassium: 4.3 mEq/L (ref 3.5–5.1)
Sodium: 140 mEq/L (ref 135–145)

## 2020-10-13 LAB — VITAMIN D 25 HYDROXY (VIT D DEFICIENCY, FRACTURES): VITD: 29.77 ng/mL — ABNORMAL LOW (ref 30.00–100.00)

## 2020-10-14 ENCOUNTER — Encounter: Payer: Self-pay | Admitting: Family Medicine

## 2020-10-14 ENCOUNTER — Other Ambulatory Visit: Payer: Self-pay | Admitting: Family Medicine

## 2020-10-14 MED ORDER — VITAMIN D 50 MCG (2000 UT) PO TABS: 4000.0000 [IU] | ORAL_TABLET | Freq: Every day | ORAL | Status: DC

## 2020-10-17 ENCOUNTER — Encounter: Payer: Self-pay | Admitting: Family Medicine

## 2020-10-17 ENCOUNTER — Other Ambulatory Visit: Payer: Self-pay | Admitting: Family Medicine

## 2020-10-17 MED ORDER — ALENDRONATE SODIUM 70 MG PO TABS: 70.0000 mg | ORAL_TABLET | ORAL | 3 refills | Status: DC

## 2020-10-22 ENCOUNTER — Ambulatory Visit: Payer: 59 | Admitting: Cardiovascular Disease

## 2020-11-07 ENCOUNTER — Emergency Department (HOSPITAL_COMMUNITY)
Admission: EM | Admit: 2020-11-07 | Discharge: 2020-11-07 | Disposition: A | Discharge status: Home / Self Care | Payer: 59 | Attending: Emergency Medicine | Admitting: Emergency Medicine

## 2020-11-07 ENCOUNTER — Encounter (HOSPITAL_COMMUNITY): Payer: Self-pay | Admitting: Emergency Medicine

## 2020-11-07 ENCOUNTER — Other Ambulatory Visit: Payer: Self-pay

## 2020-11-07 ENCOUNTER — Emergency Department (HOSPITAL_COMMUNITY): Payer: 59

## 2020-11-07 DIAGNOSIS — Z79899 Other long term (current) drug therapy: Secondary | ICD-10-CM | POA: Diagnosis not present

## 2020-11-07 DIAGNOSIS — Z96651 Presence of right artificial knee joint: Secondary | ICD-10-CM | POA: Diagnosis not present

## 2020-11-07 DIAGNOSIS — R110 Nausea: Secondary | ICD-10-CM | POA: Diagnosis not present

## 2020-11-07 DIAGNOSIS — R0789 Other chest pain: Secondary | ICD-10-CM

## 2020-11-07 DIAGNOSIS — R072 Precordial pain: Secondary | ICD-10-CM | POA: Diagnosis not present

## 2020-11-07 DIAGNOSIS — Z7982 Long term (current) use of aspirin: Secondary | ICD-10-CM | POA: Diagnosis not present

## 2020-11-07 DIAGNOSIS — I10 Essential (primary) hypertension: Secondary | ICD-10-CM | POA: Diagnosis not present

## 2020-11-07 DIAGNOSIS — E039 Hypothyroidism, unspecified: Secondary | ICD-10-CM | POA: Diagnosis not present

## 2020-11-07 DIAGNOSIS — I2510 Atherosclerotic heart disease of native coronary artery without angina pectoris: Secondary | ICD-10-CM | POA: Diagnosis not present

## 2020-11-07 LAB — CBC
HCT: 38.1 % (ref 36.0–46.0)
Hemoglobin: 12.4 g/dL (ref 12.0–15.0)
MCH: 31.2 pg (ref 26.0–34.0)
MCHC: 32.5 g/dL (ref 30.0–36.0)
MCV: 95.7 fL (ref 80.0–100.0)
Platelets: 218 10*3/uL (ref 150–400)
RBC: 3.98 MIL/uL (ref 3.87–5.11)
RDW: 13.1 % (ref 11.5–15.5)
WBC: 9.7 10*3/uL (ref 4.0–10.5)
nRBC: 0 % (ref 0.0–0.2)

## 2020-11-07 LAB — HEPATIC FUNCTION PANEL
ALT: 18 U/L (ref 0–44)
AST: 24 U/L (ref 15–41)
Albumin: 3.8 g/dL (ref 3.5–5.0)
Alkaline Phosphatase: 139 U/L — ABNORMAL HIGH (ref 38–126)
Bilirubin, Direct: 0.2 mg/dL (ref 0.0–0.2)
Indirect Bilirubin: 0.6 mg/dL (ref 0.3–0.9)
Total Bilirubin: 0.8 mg/dL (ref 0.3–1.2)
Total Protein: 6.6 g/dL (ref 6.5–8.1)

## 2020-11-07 LAB — BASIC METABOLIC PANEL
Anion gap: 10 (ref 5–15)
BUN: 18 mg/dL (ref 8–23)
CO2: 22 mmol/L (ref 22–32)
Calcium: 8.9 mg/dL (ref 8.9–10.3)
Chloride: 106 mmol/L (ref 98–111)
Creatinine, Ser: 0.9 mg/dL (ref 0.44–1.00)
GFR, Estimated: >60 mL/min (ref >60)
Glucose, Bld: 112 mg/dL — ABNORMAL HIGH (ref 70–99)
Potassium: 4.4 mmol/L (ref 3.5–5.1)
Sodium: 138 mmol/L (ref 135–145)

## 2020-11-07 LAB — TROPONIN I (HIGH SENSITIVITY)
Troponin I (High Sensitivity): 3 ng/L (ref <18)
Troponin I (High Sensitivity): 3 ng/L (ref <18)

## 2020-11-07 LAB — LIPASE, BLOOD: Lipase: 39 U/L (ref 11–51)

## 2020-11-07 MED ORDER — CYCLOBENZAPRINE HCL 10 MG PO TABS: 10.0000 mg | ORAL_TABLET | Freq: Two times a day (BID) | ORAL | 0 refills | Status: DC | PRN

## 2020-11-07 MED ORDER — LIDOCAINE 5 % EX PTCH: 1.0000 | MEDICATED_PATCH | CUTANEOUS | 0 refills | Status: AC

## 2020-11-07 MED ORDER — MORPHINE SULFATE (PF) 4 MG/ML IV SOLN
4.0000 mg | Freq: Once | INTRAVENOUS | Status: AC
  Administered 2020-11-07: 4 mg via INTRAVENOUS
  Filled 2020-11-07: qty 1

## 2020-11-07 MED ORDER — MORPHINE SULFATE (PF) 4 MG/ML IV SOLN
4.0000 mg | Freq: Once | INTRAVENOUS | Status: AC
Start: 2020-11-07 — End: 2020-11-07
  Administered 2020-11-07: 4 mg via INTRAVENOUS
  Filled 2020-11-07: qty 1

## 2020-11-07 MED ORDER — IOHEXOL 350 MG/ML SOLN
87.0000 mL | Freq: Once | INTRAVENOUS | Status: AC | PRN
  Administered 2020-11-07: 87 mL via INTRAVENOUS

## 2020-11-07 NOTE — ED Triage Notes (Signed)
Pt reports R sided chest pain/rib pain since last night.  Denies SOB but states it is "harder to breathe".

## 2020-11-07 NOTE — Discharge Instructions (Signed)
Your work-up today was overall reassuring.  We look for multiple causes of the discomfort and ruled out blood clot, pneumonia, collapsed lung, or cardiac cause at this time.  I suspect your symptoms are more chest wall related to the muscles.  We discussed the possibility of this being a early shingles presentation so if you do see rash develop, please return to the nearest emergency department or see your PCP to talk about antiviral medications.  Please use the muscle medicine and numbing patch to help with the discomfort.  Please follow-up.  If any symptoms change or worsen acutely, please return to the nearest emergency department.

## 2020-11-07 NOTE — ED Notes (Signed)
Patient transported to CT 

## 2020-11-07 NOTE — ED Provider Notes (Signed)
[facility] EMERGENCY DEPARTMENT Provider Note   CSN: [id number] Arrival date & time: 11/07/20  5784     History Chief Complaint  Patient presents with  . R sided chest pain    Traci [name] is a 65 y.o. female.  The history is provided by the patient and medical records. No language interpreter was used.  Chest Pain Pain location:  R chest, R lateral chest and substernal area Pain quality: aching and sharp   Pain radiates to:  Does not radiate Pain severity:  Severe Onset quality:  Sudden Duration:  10 hours Timing:  Constant Progression:  Unchanged Chronicity:  New Relieved by:  Nothing Worsened by:  Nothing Ineffective treatments:  None tried Associated symptoms: nausea   Associated symptoms: no abdominal pain, no altered mental status, no back pain, no claudication, no cough, no diaphoresis, no dizziness, no fatigue, no fever, no headache, no lower extremity edema, no near-syncope, no palpitations, no shortness of breath, no vomiting and no weakness        Past Medical History:  Diagnosis Date  . Abnormal alkaline phosphatase test    with neg eval prev  . Arthritis   . CAD in native artery 07/13/2020  . Depression    Previously responded to Wellbutrin  . Headache(784.0)   . Hyperlipidemia   . Hypothyroidism   . Migraines   . Osteoporosis    Fosamax start 10/2020  . Stroke (HCC) 12/2000  . Thyroid disease    Hypothyroidism    Patient Active Problem List   Diagnosis Date Noted  . CAD in native artery 07/13/2020  . Advance care planning 02/03/2020  . LAP-BAND surgery status 02/03/2020  . Memory loss of unknown cause 06/16/2019  . Hyperglycemia 05/28/2019  . Vitamin D deficiency 12/22/2018  . Alkaline phosphatase elevation 12/22/2018  . Tremor 12/25/2017  . Migraines   . Essential hypertension   . GERD (gastroesophageal reflux disease) 01/25/2016  . Poor fine motor skills 06/02/2014  . Rash and nonspecific skin eruption  02/06/2013  . Right knee DJD 11/26/2012    Class: Chronic  . Leg edema 09/20/2012  . Knee pain 08/24/2012  . Osteoporosis 08/04/2012  . Work-related stress 03/18/2012  . Routine general medical examination at a health care facility 07/30/2011  . Back pain 06/08/2011  . Hypothyroidism 04/06/2010  . Hyperlipidemia 04/06/2010  . Depression 04/06/2010  . Myalgia and myositis 04/06/2010  . HEADACHE 04/06/2010  . CEREBROVASCULAR ACCIDENT, HX OF 04/06/2010    Past Surgical History:  Procedure Laterality Date  . CHOLECYSTECTOMY     ? 20 years ago  . LAPAROSCOPIC GASTRIC BANDING  2006  . LEFT HEART CATH AND CORONARY ANGIOGRAPHY N/A 11/22/2016   Procedure: Left Heart Cath and Coronary Angiography;  Surgeon: Iran Ouch, MD;  Location: [facility] INVASIVE CV LAB;  Service: Cardiovascular;  Laterality: N/A;  . TOTAL KNEE ARTHROPLASTY Right 11/26/2012   Procedure: TOTAL KNEE ARTHROPLASTY- right ;  Surgeon: Velna Ochs, MD;  Location: [facility] OR;  Service: Orthopedics;  Laterality: Right;     OB History   No obstetric history on file.     Family History  Problem Relation Age of Onset  . Stroke Mother   . Diabetes Mother   . Heart attack Father        Deceased, 69  . Cancer Other        Ovarian, Uterine  . Heart disease Other   . Melanoma Sister   . Healthy Brother   .  Colon cancer Neg Hx   . Breast cancer Neg Hx     Social History   Tobacco Use  . Smoking status: Never Smoker  . Smokeless tobacco: Never Used  Vaping Use  . Vaping Use: Never used  Substance Use Topics  . Alcohol use: No    Alcohol/week: 0.0 standard drinks    Comment: "maybe one on new years"  . Drug use: No    Home Medications Prior to Admission medications   Medication Sig Start Date End Date Taking? Authorizing Provider  alendronate (FOSAMAX) 70 MG tablet Take 1 tablet (70 mg total) by mouth every 7 (seven) days. Take with a full glass of water on an empty stomach. 10/17/20   Joaquim Nam, MD   aspirin EC 81 MG tablet Take 81 mg by mouth daily.    [provider]  aspirin-acetaminophen-caffeine (EXCEDRIN MIGRAINE) [phone] MG tablet Take 1 tablet by mouth every 8 (eight) hours as needed for migraine. 11/24/16   Joseph Art, DO  Biotin 5 MG TABS Take 5 mg by mouth.    [provider]  buPROPion (WELLBUTRIN SR) 150 MG 12 hr tablet Take 1 tablet (150 mg total) by mouth 2 (two) times daily. 02/03/20   Joaquim Nam, MD  Calcium Carb-Cholecalciferol (CALCIUM 600 + D) 600-200 MG-UNIT TABS Take 600 mg by mouth daily.    [provider]  carvedilol (COREG) 6.25 MG tablet TAKE 1 TABLET BY MOUTH TWICE DAILY WITH A MEAL 01/27/20   Chilton Si, MD  Cholecalciferol (VITAMIN D) 50 MCG (2000 UT) tablet Take 2 tablets (4,000 Units total) by mouth daily. 10/14/20   Joaquim Nam, MD  cyanocobalamin 100 MCG tablet Take 100 mcg by mouth daily.    [provider]  diclofenac (VOLTAREN) 75 MG EC tablet Take 1 tablet (75 mg total) by mouth 2 (two) times daily. 04/15/20   Lenn Sink, DPM  L-Methylfolate-Algae (DEPLIN 15 PO) Take 15 mg by mouth daily.    [provider]  levothyroxine (EUTHYROX) 112 MCG tablet TAKE 1 TABLET BY MOUTH  DAILY BEFORE  BREAKFAST 02/03/20   Joaquim Nam, MD  Multiple Vitamin (MULTIVITAMIN) tablet Take 1 tablet by mouth daily.    [provider]  NEURONTIN 300 MG capsule Take 300 mg by mouth at bedtime. 07/02/20   [provider]  omeprazole (PRILOSEC) 20 MG capsule Take 1 capsule (20 mg total) by mouth daily. 02/03/20   Joaquim Nam, MD  rosuvastatin (CRESTOR) 20 MG tablet TAKE 1 TABLET BY MOUTH  DAILY 07/28/20   Chilton Si, MD  Vilazodone HCl (VIIBRYD) 20 MG TABS Take 1 tablet (20 mg total) by mouth daily. 09/10/20   Mozingo, Thereasa Solo, NP    Allergies    Lexapro [escitalopram oxalate], Meperidine hcl, Simvastatin, Tape, Venlafaxine, Codeine, and Tramadol  Review of Systems   Review  of Systems  Constitutional: Negative for chills, diaphoresis, fatigue and fever.  HENT: Negative for congestion.   Eyes: Negative for visual disturbance.  Respiratory: Positive for chest tightness. Negative for cough, shortness of breath and wheezing.   Cardiovascular: Positive for chest pain. Negative for palpitations, claudication, leg swelling and near-syncope.  Gastrointestinal: Positive for nausea. Negative for abdominal pain, constipation, diarrhea and vomiting.  Genitourinary: Negative for dysuria, flank pain and frequency.  Musculoskeletal: Negative for back pain, neck pain and neck stiffness.  Neurological: Negative for dizziness, weakness, light-headedness and headaches.  Psychiatric/Behavioral: Negative for agitation.  All other systems reviewed and  are negative.   Physical Exam Updated Vital Signs BP (!) 154/102 (BP Location: Left Arm)   Pulse 92   Temp 98.4 F (36.9 C) (Oral)   Resp 18   SpO2 98%   Physical Exam Vitals and nursing note reviewed.  Constitutional:      General: She is not in acute distress.    Appearance: She is well-developed and well-nourished. She is not ill-appearing, toxic-appearing or diaphoretic.  HENT:     Head: Normocephalic and atraumatic.     Nose: Nose normal. No congestion or rhinorrhea.     Mouth/Throat:     Mouth: Mucous membranes are moist.     Pharynx: No oropharyngeal exudate.  Eyes:     Extraocular Movements: Extraocular movements intact.     Conjunctiva/sclera: Conjunctivae normal.     Pupils: Pupils are equal, round, and reactive to light.  Cardiovascular:     Rate and Rhythm: Normal rate and regular rhythm.     Pulses: Normal pulses.     Heart sounds: No murmur heard.   Pulmonary:     Effort: Pulmonary effort is normal. No respiratory distress.     Breath sounds: Normal breath sounds. No wheezing, rhonchi or rales.  Chest:     Chest wall: Tenderness present.    Abdominal:     Palpations: Abdomen is soft.      Tenderness: There is no abdominal tenderness. There is no right CVA tenderness, left CVA tenderness, guarding or rebound.  Musculoskeletal:        General: No tenderness or edema.     Cervical back: Neck supple. No tenderness.       Back:  Skin:    General: Skin is warm and dry.     Capillary Refill: Capillary refill takes less than 2 seconds.     Findings: No erythema.  Neurological:     Mental Status: She is alert.  Psychiatric:        Mood and Affect: Mood and affect and mood normal.     ED Results / Procedures / Treatments   Labs (all labs ordered are listed, but only abnormal results are displayed) Labs Reviewed  BASIC METABOLIC PANEL - Abnormal; Notable for the following components:      Result Value   Glucose, Bld 112 (*)    All other components within normal limits  HEPATIC FUNCTION PANEL - Abnormal; Notable for the following components:   Alkaline Phosphatase 139 (*)    All other components within normal limits  CBC  LIPASE, BLOOD  TROPONIN I (HIGH SENSITIVITY)  TROPONIN I (HIGH SENSITIVITY)    EKG EKG Interpretation  Date/Time:  Sunday November 07 2020 07:20:43 EST Ventricular Rate:  92 PR Interval:  160 QRS Duration: 78 QT Interval:  382 QTC Calculation: 472 R Axis:   23 Text Interpretation: Normal sinus rhythm Cannot rule out Anterior infarct , age undetermined Abnormal ECG When compared to prior, similar apperance. No STEMI Confirmed by Theda Belfast ([id number]) on 11/07/2020 8:20:39 AM   Radiology DG Chest 2 View  Result Date: 11/07/2020 CLINICAL DATA:  Right-sided lateral chest pain. EXAM: CHEST - 2 VIEW COMPARISON:  None. FINDINGS: The heart, hila, and mediastinum are normal. No pneumothorax. No nodules or masses. Mild atelectasis in the left base. No other acute abnormalities are identified. IMPRESSION: No active cardiopulmonary disease. Electronically Signed   By: Gerome Sam III M.D   On: 11/07/2020 07:57   CT Angio Chest PE W and/or Wo  Contrast  Result Date:  11/07/2020 CLINICAL DATA:  65 year old female with acute chest pain and shortness of breath. EXAM: CT ANGIOGRAPHY CHEST WITH CONTRAST TECHNIQUE: Multidetector CT imaging of the chest was performed using the standard protocol during bolus administration of intravenous contrast. Multiplanar CT image reconstructions and MIPs were obtained to evaluate the vascular anatomy. CONTRAST:  87mL OMNIPAQUE IOHEXOL 350 MG/ML SOLN COMPARISON:  11/07/2020 chest radiograph FINDINGS: Cardiovascular: This is a technically adequate study but respiratory motion artifact in the LOWER lungs slightly limits sensitivity. There is no evidence of pulmonary emboli. Cardiomegaly identified. There is no evidence of thoracic aortic aneurysm or pericardial effusion. Mediastinum/Nodes: No enlarged mediastinal, hilar, or axillary lymph nodes. Thyroid gland, trachea, and esophagus demonstrate no significant findings. Lungs/Pleura: Minimal bibasilar atelectasis/scarring noted. There is no evidence of airspace disease, consolidation, nodule, mass, pleural effusion or pneumothorax. Upper Abdomen: No acute abnormality.  Gastric band identified. Musculoskeletal: No acute or suspicious bony abnormalities. Review of the MIP images confirms the above findings. IMPRESSION: 1. No evidence of pulmonary emboli or thoracic aortic aneurysm. 2. Cardiomegaly. 3. Minimal bibasilar atelectasis/scarring. Electronically Signed   By: Harmon Pier M.D.   On: 11/07/2020 12:06    Procedures Procedures   Medications Ordered in ED Medications  morphine 4 MG/ML injection 4 mg (4 mg Intravenous Given 11/07/20 0852)  iohexol (OMNIPAQUE) 350 MG/ML injection 87 mL (87 mLs Intravenous Contrast Given 11/07/20 1155)  morphine 4 MG/ML injection 4 mg (4 mg Intravenous Given 11/07/20 1423)    ED Course  I have reviewed the triage vital signs and the nursing notes.  Pertinent labs & imaging results that were available during my care of the patient were  reviewed by me and considered in my medical decision making (see chart for details).    MDM Rules/Calculators/A&P                          Traci [name] is a 65 y.o. female with a past medical history significant for CAD, hypertension, hyperlipidemia, hypothyroidism, prior stroke, osteoporosis, and previous LAP-BAND surgery who presents with severe right pleuritic chest pain.  Patient reports that last night around 10 PM, she was laying watching TV when she had sudden onset of severe sharp pain in her right lower chest.  She reports that it wraps around and feels like a "belt around her chest with the buckle on my right chest".  She says she is never had this discomfort before.  She describes as an 11 out of 10 in severity.  Is extremely pleuritic.  She reports that she is not feeling short of breath when she does not try to take a deep breath.  She denies vomiting but reports some nausea when the pain is at its worst.  She denies any trauma or any coughing fits.  Denies fevers, chills, congestion, constipation, diarrhea, or urinary symptoms.  Denies any leg pain or leg swelling.  Denies history of DVT or PE.  Denies any rashes and says that she has been vaccinated against shingles.  Denies any other complaints.  On exam, lungs are symmetric and clear.  Chest is extremely tender on the right side.  No rashes seen to indicate shingles.  Patient also was trying to hold her right arm to keep her chest wall still so as not to cause worsened pain.  Intact sensation, strength, and pulses in extremities.  No tenderness or swelling in the legs.  Abdomen nontender with normal bowel sounds.  Clinically I am most  concerned about the pulmonary embolism as the cause of the patient's symptoms.  We will get a chest x-ray and screening labs initially but we will order a CT PE study as well.  EKG showed no STEMI.  Chest x-ray shows no acute abnormality.    Labs show no leukocytosis or anemia.  Initial troponin is  negative.  Kidney function is normal.  We will get the CT scan but we will also get hepatic function lipase to make sure there is not a pain coming from just under the diaphragm.  Patient be given pain medicine.  Anticipate reassessment after work-up.  1:35 PM Work-up returned and is overall reassuring.  Troponin negative x2.  Hepatic function similar to prior and improved.  Lipase not elevated.  Metabolic panel reassuring.  CT scan showed no evidence of pulmonary embolism.  There was some minimal atelectasis and some cardiomegaly.  On reassessment, she still having significant pain but now I suspect it is more musculoskeletal after the reassuring work-up so far.  Vitals remain reassuring.  Patient given 1 more dose of pain medicine and will likely be able to go home for outpatient follow-up for likely musculoskeletal chest wall pain.  Patient agrees with work-up and plan.  We will also p.o. challenge given the nausea she had earlier.  Patient was able to pass p.o. challenge and was feeling better.  We also discussed the possibility of this being initial presentation of shingles and patient will watch for this.  She will give prescription for muscle relaxant and Lidoderm patch to help with her discomfort.  Patient agreed with plan of care and felt better and had no other questions or concerns.  Patient discharged in good condition with improvement in symptoms.  Final Clinical Impression(s) / ED Diagnoses Final diagnoses:  Right-sided chest wall pain    Rx / DC Orders ED Discharge Orders         Ordered    lidocaine (LIDODERM) 5 %  Every 24 hours        11/07/20 1502    cyclobenzaprine (FLEXERIL) 10 MG tablet  2 times daily PRN        11/07/20 1502         Clinical Impression: 1. Right-sided chest wall pain     Disposition: Discharge  Condition: Good  I have discussed the results, Dx and Tx plan with the pt(& family if present). He/she/they expressed understanding and agree(s)  with the plan. Discharge instructions discussed at great length. Strict return precautions discussed and pt &/or family have verbalized understanding of the instructions. No further questions at time of discharge.    Discharge Medication List as of 11/07/2020  3:03 PM    START taking these medications   Details  cyclobenzaprine (FLEXERIL) 10 MG tablet Take 1 tablet (10 mg total) by mouth 2 (two) times daily as needed for muscle spasms., Starting Sun 11/07/2020, Print    lidocaine (LIDODERM) 5 % Place 1 patch onto the skin daily. Remove & Discard patch within 12 hours or as directed by MD, Starting Sun 11/07/2020, Print        Follow Up: Joaquim Namuncan, Graham S, MD [address] [place] [id number] [phone]     [facility] [address] [id number] [place] [id number] [phone]       Deforest Maiden, Canary Brimhristopher J, MD 11/07/20 [phone]

## 2020-11-07 NOTE — ED Notes (Signed)
Main lab to add on Hepatic panel and lipase.

## 2020-11-09 ENCOUNTER — Ambulatory Visit (INDEPENDENT_AMBULATORY_CARE_PROVIDER_SITE_OTHER): Payer: 59 | Admitting: Podiatry

## 2020-11-09 DIAGNOSIS — Z5329 Procedure and treatment not carried out because of patient's decision for other reasons: Secondary | ICD-10-CM

## 2020-11-09 NOTE — Progress Notes (Signed)
No show for appt. 

## 2020-11-26 ENCOUNTER — Other Ambulatory Visit: Payer: Self-pay

## 2020-11-26 ENCOUNTER — Ambulatory Visit (INDEPENDENT_AMBULATORY_CARE_PROVIDER_SITE_OTHER): Payer: 59 | Admitting: Podiatry

## 2020-11-26 DIAGNOSIS — L600 Ingrowing nail: Secondary | ICD-10-CM

## 2020-11-26 DIAGNOSIS — L603 Nail dystrophy: Secondary | ICD-10-CM | POA: Diagnosis not present

## 2020-12-03 NOTE — Progress Notes (Signed)
  Subjective:  Patient ID: Traci [name], female    DOB: 1955/09/18,  MRN: [id number]  Chief Complaint  Patient presents with  . Nail Problem    Pt states hx of ingrown nail removal left 1st lateral border and has had some stinging since then. Pt states the nail has also grown thick and crooked.    65 y.o. female presents with the above complaint. History confirmed with patient.   Objective:  Physical Exam: warm, good capillary refill, no trophic changes or ulcerative lesions, normal DP and PT pulses and normal sensory exam.  Left hallux nail partial onycholysis with thickening.  No evidence of ingrown nail  Assessment:   1. Nail dystrophy   2. Ingrown nail      Plan:  Patient was evaluated and treated and all questions answered.  Dystrophic Nail, left -Palliative debridement of ingrowing nails in slant-back fashion to patient relief.  -Discussed with time the nail should grow back normally. -Follow-up should issues persist  No follow-ups on file.

## 2020-12-08 ENCOUNTER — Other Ambulatory Visit: Payer: Self-pay | Admitting: Cardiovascular Disease

## 2020-12-09 ENCOUNTER — Ambulatory Visit: Payer: 59 | Admitting: Adult Health

## 2020-12-13 ENCOUNTER — Other Ambulatory Visit: Payer: Self-pay | Admitting: Family Medicine

## 2020-12-20 ENCOUNTER — Other Ambulatory Visit: Payer: Self-pay

## 2020-12-20 ENCOUNTER — Ambulatory Visit: Payer: 59 | Admitting: Neurology

## 2020-12-20 ENCOUNTER — Encounter: Payer: Self-pay | Admitting: Neurology

## 2020-12-20 VITALS — BP 122/82 | HR 110 | Ht 64.0 in | Wt 229.0 lb

## 2020-12-20 DIAGNOSIS — G251 Drug-induced tremor: Secondary | ICD-10-CM

## 2020-12-20 NOTE — Progress Notes (Signed)
Follow-up Visit   Date: 12/20/20   Traci [name] MRN: [id number] DOB: 1955-09-15   Interim History: Traci [name] is a 65 y.o. left-handed Caucasian female with hypothyroidism, depression, hyperlipidemia, prior stroke, and heart failure returning to the clinic for follow-up of tremor.  The patient was accompanied to the clinic by self.  Since her last visit in December, she reports no change in her right hand tremor.  Sometimes, she feels that it is more prominent, such as when stressed/anxious.  No tremor in the right leg or left side. She denies weakness, stiffness, or falls.  Her hand writing is become more and more illegible.  She often needs help to write, such as as when leaving a tip or signing. Tremor does not interfere with her daily activities, but the arm can be sore by the end of the day.   History of present illness: In October, she started Rexulti, atypical antipsychotic, for depression.  She was on it two weeks and began having right hand tremor and leg tremor, sometimes so prominent that she would need to sit on her hand.  She had noticed reduced intensity of the tremor, such that it does not flap as hard, but now has a constant low-amplitude tremor of the hand.    She was previously seen here for hand writing changes and bilateral arm pain. Previously, she underwent extensive work-up including EMG, MRI brain/C-spine/L-spine, and serology testing.  EMG showed bilateral C8 radiculopathy and left L3-4 radiculopathy, which was also evidence on imaging.  Laboratory testing including inflammatory/autoimmune diseases, nutritional deficiencies, myasthenia gravis, and myopathy has been unremarkable (see below).  She also was evaluated by Dr. Nickola Major in Rheumatology whose work-up was negative.  She completed neck physiotherapy, without any improvement.    Medications:  Current Outpatient Medications on File Prior to Visit  Medication Sig Dispense Refill  . alendronate  (FOSAMAX) 70 MG tablet Take 1 tablet (70 mg total) by mouth every 7 (seven) days. Take with a full glass of water on an empty stomach. 13 tablet 3  . aspirin EC 81 MG tablet Take 81 mg by mouth daily.    [name] aspirin-acetaminophen-caffeine (EXCEDRIN MIGRAINE) 250-250-65 MG tablet Take 1 tablet by mouth every 8 (eight) hours as needed for migraine. 30 tablet 0  . Biotin 5 MG TABS Take 5 mg by mouth.    [name] buPROPion (WELLBUTRIN SR) 150 MG 12 hr tablet Take 1 tablet (150 mg total) by mouth 2 (two) times daily. 180 tablet 3  . CALCIUM PO Take 1 tablet by mouth daily.    . carvedilol (COREG) 6.25 MG tablet TAKE 1 TABLET BY MOUTH  TWICE DAILY WITH A MEAL 180 tablet 2  . Cholecalciferol (VITAMIN D) 50 MCG (2000 UT) tablet Take 2 tablets (4,000 Units total) by mouth daily.    . cyanocobalamin 100 MCG tablet Take 100 mcg by mouth daily.    . cyclobenzaprine (FLEXERIL) 10 MG tablet Take 1 tablet (10 mg total) by mouth 2 (two) times daily as needed for muscle spasms. 20 tablet 0  . diclofenac (VOLTAREN) 75 MG EC tablet Take 1 tablet (75 mg total) by mouth 2 (two) times daily. 50 tablet 2  . gabapentin (NEURONTIN) 300 MG capsule One tablet at bedtime    . levothyroxine (SYNTHROID) 112 MCG tablet TAKE 1 TABLET BY MOUTH  DAILY BEFORE BREAKFAST  EQUIVALENT TO EUTHYROX 90 tablet 1  . lidocaine (LIDODERM) 5 % Place 1 patch onto the skin daily. Remove &  Discard patch within 12 hours or as directed by MD 15 patch 0  . Multiple Vitamin (MULTIVITAMIN) tablet Take 1 tablet by mouth daily.    [name] NEURONTIN 300 MG capsule Take 300 mg by mouth at bedtime.    [name] omeprazole (PRILOSEC) 20 MG capsule Take 1 capsule (20 mg total) by mouth daily. 90 capsule 3  . rosuvastatin (CRESTOR) 20 MG tablet TAKE 1 TABLET BY MOUTH  DAILY 90 tablet 3  . Vilazodone HCl (VIIBRYD) 20 MG TABS Take 1 tablet (20 mg total) by mouth daily. 90 tablet 3   No current facility-administered medications on file prior to visit.    Allergies:  Allergies   Allergen Reactions  . Lexapro [Escitalopram Oxalate] Other (See Comments)    Fatigue, sedation  . Meperidine Hcl Other (See Comments)    Cardiac arrest and syncope  . Simvastatin Other (See Comments)    REACTION: muscle aches  . Tape     Allergic to bandaid adhesive.    . Venlafaxine     Intolerant.  Joint aches.  . Codeine Itching  . Tramadol Itching    Vital Signs:  BP 122/82   Pulse (!) 110   Ht 5\' 4"  (1.626 m)   Wt 229 lb (103.9 kg)   SpO2 96%   BMI 39.31 kg/m   Neurological Exam: MENTAL STATUS including orientation to time, place, person, recent and remote memory, attention span and concentration, language, and fund of knowledge is normal.  Speech is not dysarthric.  CRANIAL NERVES:  No visual field defects.  Pupils equal round and reactive to light.  Normal conjugate, extra-ocular eye movements in all directions of gaze.  No ptosis   MOTOR:  Motor strength is 5/5 in all extremities. Low amplitude, high frequency resting hand tremor.  Dissipates with action.   No pronator drift.  Tone is normal.    MSRs:  Reflexes are 2+/4 throughout.  SENSORY:  Intact to vibration throughout.  COORDINATION/GAIT:  Normal finger-to- nose-finger.  Intact rapid alternating movements bilaterally.  Gait narrow based and stable. Right hand tremor is more noticeable when walking.   Data: n/a  IMPRESSION/PLAN: Drug-induced tremor, which started after taking antipsychotic, Rexulti in October 2021.  She has been off medication for 6 months, but continues to have tremor.  I discussed that we can order DaT scan to see if this is a parkinsonian tremor or follow her clinically.  Given that her tremor is very mild and does not interfere with daily activities, it was decided to monitor.  If symptoms get worse over the next 6 months, DaT scan will be ordered.  Return to clinic in 6 months  Thank you for allowing me to participate in patient's care.  If I can answer any additional questions, I would  be pleased to do so.    Sincerely,    Jerine Surles K. November 2021, DO

## 2020-12-20 NOTE — Patient Instructions (Addendum)
If your tremor gets worse, please call my office  Return to clinic in 6 months

## 2020-12-22 ENCOUNTER — Other Ambulatory Visit: Payer: Self-pay | Admitting: Family Medicine

## 2021-01-12 NOTE — Progress Notes (Signed)
Cardiology Office Note   Date:  01/13/2021   ID:  Traci [name], DOB 1956-02-26, MRN [id number]  PCP:  Traci Namuncan, Graham S, MD  Cardiologist:   Traci Siiffany [place], MD   Chief Complaint  Patient presents with  . Follow-up    5 months.    History of Present Illness: Traci [name] is a 65 y.o. female with hypertension, hyperlipidemia, prior stroke, fibromyalgia, [name]/p gastric band, and hypothyroidism who presents for follow up.  Traci [name] was admitted to the hospital 11/21/16 with three weeks of intermittent chest pain.  Cardiac enzymes were negative.  She underwent LHC 11/22/16 and had mild luminal irregularities but no obstructive disease.  LVEF was 50-55%.  LVEDP was mildly elevated at 16-20 mmHg.  BP was poorly controlled in the hospital.  She was started on carvedilol and diuresed with IV lasix.  Her echo during that hospitalization revealed LVEF 55-60% with mild LVH and grade 1 diastolic dysfunction.  Her heart catheterization was complicated by a small R groin hematoma.  She followed up with her PCP, Traci [name], who wondered if she may have microvascular angina given her improvement with a beta blocker.  She continued to complain of intermittent chest pain.  Carvedilol was increased and she was started on aspirin and rosuvastatin.    At her last appointment she was not getting much exercise because of back pain.  She was also struggling with her diet and not able to tolerate her CPAP. She was seen in the ED 10/2020 for right-sided chest pain. Cardiac enzymes were negative and CT scan was negative for PE. Her symptoms were thought to be musculoskeletal. Today, she is feeling okay other than her back pain. She has had several spinal injections but she says the pain continues to worsen gradually. Prior to her ED visit 10/2020, she was lying in bed and suddenly had pain that felt like she broke a rib. She reports no more chest pain at this time. Her diet is stable, she continues to cut her  portions and does not return for seconds.  She considered healthy weight and wellness but does not want to go due to the co-pays.  She does not check her blood pressure at home. Normally at night she checks for LE edema and has not noticed any lately. She denies any shortness of breath or palpitations. No syncope or lightheadedness to note. Also has no orthopnea or PND.    Past Medical History:  Diagnosis Date  . Abnormal alkaline phosphatase test    with neg eval prev  . Arthritis   . CAD in native artery 07/13/2020  . Chronic diastolic heart failure (HCC) 01/13/2021  . Depression    Previously responded to Wellbutrin  . Headache(784.0)   . Hyperlipidemia   . Hypothyroidism   . Migraines   . Osteoporosis    Fosamax start 10/2020  . Stroke (HCC) 12/2000  . Thyroid disease    Hypothyroidism    Past Surgical History:  Procedure Laterality Date  . CHOLECYSTECTOMY     ? 20 years ago  . LAPAROSCOPIC GASTRIC BANDING  2006  . LEFT HEART CATH AND CORONARY ANGIOGRAPHY N/A 11/22/2016   Procedure: Left Heart Cath and Coronary Angiography;  Surgeon: Traci OuchMuhammad A Arida, MD;  Location: [facility]VASIVE [facility];  Service: Cardiovascular;  Laterality: N/A;  . TOTAL KNEE ARTHROPLASTY Right 11/26/2012   Procedure: TOTAL KNEE ARTHROPLASTY- right ;  Surgeon: Traci OchsPeter G Dalldorf, MD;  Location: [facility] OR;  Service: Orthopedics;  Laterality: Right;     Current Outpatient Medications  Medication Sig Dispense Refill  . alendronate (FOSAMAX) 70 MG tablet Take 1 tablet (70 mg total) by mouth every 7 (seven) days. Take with a full glass of water on an empty stomach. 13 tablet 3  . aspirin EC 81 MG tablet Take 81 mg by mouth daily.    [name] aspirin-acetaminophen-caffeine (EXCEDRIN MIGRAINE) 250-250-65 MG tablet Take 1 tablet by mouth every 8 (eight) hours as needed for migraine. 30 tablet 0  . Biotin 5 MG TABS Take 5 mg by mouth.    [name] buPROPion (WELLBUTRIN SR) 150 MG 12 hr tablet Take 1 tablet (150 mg total) by mouth 2 (two)  times daily. 180 tablet 3  . CALCIUM PO Take 1 tablet by mouth daily.    . Cholecalciferol (VITAMIN D) 50 MCG (2000 UT) tablet Take 2 tablets (4,000 Units total) by mouth daily.    . cyanocobalamin 100 MCG tablet Take 100 mcg by mouth daily.    . cyclobenzaprine (FLEXERIL) 10 MG tablet Take 1 tablet (10 mg total) by mouth 2 (two) times daily as needed for muscle spasms. 20 tablet 0  . diclofenac (VOLTAREN) 75 MG EC tablet Take 1 tablet (75 mg total) by mouth 2 (two) times daily. 50 tablet 2  . gabapentin (NEURONTIN) 300 MG capsule One tablet at bedtime    . levothyroxine (SYNTHROID) 112 MCG tablet TAKE 1 TABLET BY MOUTH  DAILY BEFORE BREAKFAST  EQUIVALENT TO EUTHYROX 90 tablet 1  . lidocaine (LIDODERM) 5 % Place 1 patch onto the skin daily. Remove & Discard patch within 12 hours or as directed by MD 15 patch 0  . Multiple Vitamin (MULTIVITAMIN) tablet Take 1 tablet by mouth daily.    [name] omeprazole (PRILOSEC) 20 MG capsule TAKE 1 CAPSULE BY MOUTH  DAILY 90 capsule 3  . rosuvastatin (CRESTOR) 20 MG tablet TAKE 1 TABLET BY MOUTH  DAILY 90 tablet 3  . Vilazodone HCl (VIIBRYD) 20 MG TABS Take 1 tablet (20 mg total) by mouth daily. 90 tablet 3  . carvedilol (COREG) 12.5 MG tablet Take 1 tablet (12.5 mg total) by mouth 2 (two) times daily with a meal. 180 tablet 3   No current facility-administered medications for this visit.    Allergies:   Lexapro [escitalopram oxalate], Meperidine hcl, Simvastatin, Tape, Venlafaxine, Codeine, and Tramadol    Social History:  The patient  reports that she has never smoked. She has never used smokeless tobacco. She reports that she does not drink alcohol and does not use drugs.   Family History:  The patient'[name] family history includes Cancer in an other family member; Diabetes in her mother; Healthy in her brother; Heart attack in her father; Heart disease in an other family member; Melanoma in her sister; Stroke in her mother.    ROS:   Please see the history of  present illness. (+) chronic back pain All other systems are reviewed and negative.    PHYSICAL EXAM: VS:  BP (!) 132/98   Pulse 88   Ht 5\' 4"  (1.626 m)   Wt 230 lb (104.3 kg)   BMI 39.48 kg/m  , BMI Body mass index is 39.48 kg/m. GENERAL:  Well appearing HEENT: Pupils equal round and reactive, fundi not visualized, oral mucosa unremarkable NECK:  No jugular venous distention, waveform within normal limits, carotid upstroke brisk and symmetric, no bruits LUNGS:  Clear to auscultation bilaterally HEART:  RRR.  PMI not displaced or sustained,S1 and S2  within normal limits, no S3, no S4, no clicks, no rubs, no murmurs ABD:  Flat, positive bowel sounds normal in frequency in pitch, no bruits, no rebound, no guarding, no midline pulsatile mass, no hepatomegaly, no splenomegaly EXT:  2 plus pulses throughout, no edema, no cyanosis no clubbing SKIN:  No rashes no nodules NEURO:  Cranial nerves II through XII grossly intact, motor grossly intact throughout PSYCH:  Cognitively intact, oriented to person place and time  EKG:   07/13/2020: EKG was not ordered. 01/29/2018: Sinus rhythm. Rate 78 bpm. 07/30/2017: Sinus rhythm. Rate 81 bpm. 11/21/16: sinus rhythm. Rate 91 bpm.  CT Angio Chest 11/07/2020: IMPRESSION: 1. No evidence of pulmonary emboli or thoracic aortic aneurysm. 2. Cardiomegaly. 3. Minimal bibasilar atelectasis/scarring.  LHC 11/22/16  The left ventricular systolic function is normal.  LV end diastolic pressure is mildly elevated.  The left ventricular ejection fraction is 50-55% by visual estimate.  1. Minor luminal irregularities with no evidence of obstructive coronary artery disease. 2. Low normal LV systolic function with an EF of 50-55%. Mildly elevated left ventricular end-diastolic pressure (LVEDP between 16-20 mmHg).  Echo 11/22/16: Study Conclusions  - Left ventricle: The cavity size was normal. Wall thickness was   increased in a pattern of mild LVH.  Systolic function was normal.   The estimated ejection fraction was in the range of 55% to 60%.   Wall motion was normal; there were no regional wall motion   abnormalities. Doppler parameters are consistent with abnormal   left ventricular relaxation (grade 1 diastolic dysfunction). - Aortic valve: There was no stenosis. - Mitral valve: There was no significant regurgitation. - Left atrium: The atrium was mildly dilated. - Right ventricle: The cavity size was normal. Systolic function   was normal. - Pulmonary arteries: No complete TR doppler jet so unable to   estimate PA systolic pressure. - Inferior vena cava: The vessel was normal in size. The   respirophasic diameter changes were in the normal range (>= 50%),   consistent with normal central venous pressure.  Impressions:  - Normal LV size with mild LV hypertrophy. EF 55-60%. Normal RV   size and systolic function. No significant valvular   abnormalities.   Recent Labs: 06/22/2020: TSH 7.26 11/07/2020: ALT 18; BUN 18; Creatinine, Ser 0.90; Hemoglobin 12.4; Platelets 218; Potassium 4.4; Sodium 138    Lipid Panel    Component Value Date/Time   CHOL 126 01/26/2020 1302   TRIG 170.0 (H) 01/26/2020 1302   HDL 35.10 (L) 01/26/2020 1302   CHOLHDL 4 01/26/2020 1302   VLDL 34.0 01/26/2020 1302   LDLCALC 57 01/26/2020 1302   LDLDIRECT 103.6 07/17/2011 0920      Wt Readings from Last 3 Encounters:  01/13/21 230 lb (104.3 kg)  12/20/20 229 lb (103.9 kg)  08/13/20 222 lb (100.7 kg)      ASSESSMENT AND PLAN: CAD in native artery Minimal CAD on cath in 2018.  She has no angina.  Continue aspirin, carvedilol, and rosuvastatin.  We will check lipids and a CMP today to ensure her LDL remains less than 70.  She is going to try and work on increasing her exercise once her back pain is under better control.  Essential hypertension Blood pressure is above goal today both initially and on repeat.  She notes it has been elevated  with other providers as well.  She should be less than 130/80.  We will increase carvedilol to 12.5 mg twice daily.  She will follow-up  with our pharmacist in a month or 2 for blood pressure check.  Hyperlipidemia Continue rosuvastatin.  Check lipids and CMP today.  Chronic diastolic heart failure (HCC) Grade 1 diastolic dysfunction and mild LVH on prior echo.  We will work on improved blood pressure control.  She is euvolemic and asymptomatic at this time.    Current medicines are reviewed at length with the patient today.  The patient does not have concerns regarding medicines.  The following changes have been made: none  Labs/ tests ordered today include:   Orders Placed This Encounter  Procedures  . Lipid panel  . Comprehensive metabolic panel  . AMB Referral to [facility]     Disposition:   FU with Neviah Braud C. Duke Salvia, MD, [place] in 1 year.   I,Mathew Stumpf,acting as a Neurosurgeon for Traci Si, MD.,have documented all relevant documentation on the behalf of Traci Si, MD,as directed by  Traci Si, MD while in the presence of Traci Si, MD.  I, Anne Boltz C. Duke Salvia, MD have reviewed all documentation for this visit.  The documentation of the exam, diagnosis, procedures, and orders on 01/13/2021 are all accurate and complete.   Signed, Jalasia Eskridge C. Duke Salvia, MD, [facility]  01/13/2021 8:39 AM    [facility]

## 2021-01-13 ENCOUNTER — Other Ambulatory Visit: Payer: Self-pay

## 2021-01-13 ENCOUNTER — Ambulatory Visit: Payer: 59 | Admitting: Cardiovascular Disease

## 2021-01-13 ENCOUNTER — Encounter: Payer: Self-pay | Admitting: Cardiovascular Disease

## 2021-01-13 VITALS — BP 132/98 | HR 88 | Ht 64.0 in | Wt 230.0 lb

## 2021-01-13 DIAGNOSIS — I5032 Chronic diastolic (congestive) heart failure: Secondary | ICD-10-CM

## 2021-01-13 DIAGNOSIS — I2510 Atherosclerotic heart disease of native coronary artery without angina pectoris: Secondary | ICD-10-CM | POA: Diagnosis not present

## 2021-01-13 DIAGNOSIS — I10 Essential (primary) hypertension: Secondary | ICD-10-CM | POA: Diagnosis not present

## 2021-01-13 DIAGNOSIS — E7800 Pure hypercholesterolemia, unspecified: Secondary | ICD-10-CM

## 2021-01-13 HISTORY — DX: Chronic diastolic (congestive) heart failure: I50.32

## 2021-01-13 LAB — LIPID PANEL
Chol/HDL Ratio: 3.7 ratio (ref 0.0–4.4)
Cholesterol, Total: 133 mg/dL (ref 100–199)
HDL: 36 mg/dL — ABNORMAL LOW (ref >39)
LDL Chol Calc (NIH): 65 mg/dL (ref 0–99)
Triglycerides: 189 mg/dL — ABNORMAL HIGH (ref 0–149)
VLDL Cholesterol Cal: 32 mg/dL (ref 5–40)

## 2021-01-13 LAB — COMPREHENSIVE METABOLIC PANEL
ALT: 16 IU/L (ref 0–32)
AST: 17 IU/L (ref 0–40)
Albumin/Globulin Ratio: 2 (ref 1.2–2.2)
Albumin: 4.2 g/dL (ref 3.8–4.8)
Alkaline Phosphatase: 154 IU/L — ABNORMAL HIGH (ref 44–121)
BUN/Creatinine Ratio: 18 (ref 12–28)
BUN: 18 mg/dL (ref 8–27)
Bilirubin Total: 0.3 mg/dL (ref 0.0–1.2)
CO2: 23 mmol/L (ref 20–29)
Calcium: 8.8 mg/dL (ref 8.7–10.3)
Chloride: 104 mmol/L (ref 96–106)
Creatinine, Ser: 0.98 mg/dL (ref 0.57–1.00)
Globulin, Total: 2.1 g/dL (ref 1.5–4.5)
Glucose: 96 mg/dL (ref 65–99)
Potassium: 4.4 mmol/L (ref 3.5–5.2)
Sodium: 142 mmol/L (ref 134–144)
Total Protein: 6.3 g/dL (ref 6.0–8.5)
eGFR: 64 mL/min/{1.73_m2} (ref >59)

## 2021-01-13 MED ORDER — CARVEDILOL 12.5 MG PO TABS: 12.5000 mg | ORAL_TABLET | Freq: Two times a day (BID) | ORAL | 3 refills | Status: DC

## 2021-01-13 NOTE — Assessment & Plan Note (Signed)
Blood pressure is above goal today both initially and on repeat.  She notes it has been elevated with other providers as well.  She should be less than 130/80.  We will increase carvedilol to 12.5 mg twice daily.  She will follow-up with our pharmacist in a month or 2 for blood pressure check.

## 2021-01-13 NOTE — Assessment & Plan Note (Signed)
Minimal CAD on cath in 2018.  She has no angina.  Continue aspirin, carvedilol, and rosuvastatin.  We will check lipids and a CMP today to ensure her LDL remains less than 70.  She is going to try and work on increasing her exercise once her back pain is under better control.

## 2021-01-13 NOTE — Assessment & Plan Note (Addendum)
Grade 1 diastolic dysfunction and mild LVH on prior echo.  We will work on improved blood pressure control.  She is euvolemic and asymptomatic at this time.

## 2021-01-13 NOTE — Patient Instructions (Signed)
Medication Instructions:  INCREASE YOUR CARVEDILOL TO 12.5 MG TWICE A DAY   *If you need a refill on your cardiac medications before your next appointment, please call your pharmacy*  Lab Work: LP/CMET TODAY   If you have labs (blood work) drawn today and your tests are completely normal, you will receive your results only by: [name] MyChart Message (if you have MyChart) OR . A paper copy in the mail If you have any lab test that is abnormal or we need to change your treatment, we will call you to review the results.  Testing/Procedures: NONE   Follow-Up: At [facility], you and your health needs are our priority.  As part of our continuing mission to provide you with exceptional heart care, we have created designated Provider Care Teams.  These Care Teams include your primary Cardiologist (physician) and Advanced Practice Providers (APPs -  Physician Assistants and Nurse Practitioners) who all work together to provide you with the care you need, when you need it.  We recommend signing up for the patient portal called "MyChart".  Sign up information is provided on this After Visit Summary.  MyChart is used to connect with patients for Virtual Visits (Telemedicine).  Patients are able to view lab/test results, encounter notes, upcoming appointments, etc.  Non-urgent messages can be sent to your provider as well.   To learn more about what you can do with MyChart, go to [email or web address].    Your next appointment:   12 month(s)  The format for your next appointment:   In Person  Provider:   DR [facility] OR PA AT [facility] LOCATION   Your physician recommends that you schedule a follow-up appointment in: PHARM D 1 MONTH AT [facility] OFFICE

## 2021-01-13 NOTE — Assessment & Plan Note (Signed)
Continue rosuvastatin.  Check lipids and CMP today.

## 2021-02-14 ENCOUNTER — Other Ambulatory Visit: Payer: Self-pay

## 2021-02-14 ENCOUNTER — Ambulatory Visit (INDEPENDENT_AMBULATORY_CARE_PROVIDER_SITE_OTHER): Payer: 59 | Admitting: Pharmacist Clinician (PhC)/ Clinical Pharmacy Specialist

## 2021-02-14 DIAGNOSIS — I10 Essential (primary) hypertension: Secondary | ICD-10-CM | POA: Diagnosis not present

## 2021-02-14 NOTE — Progress Notes (Signed)
02/14/2021 Traci [name] June 03, 1956 [id number]   HPI:  Traci [name] is a 65 y.o. female patient of Dr Duke Salvia, with a PMH below who presents today for hypertension clinic evaluation.  When she saw Dr. Duke Salvia last month her pressure was noted to be 132/98.  Carvedilol was increased from 6.25 to 12.5 mg twice daily.  She has done well with the new dose and has no side effects or complaints.  She does use oral diclofenac on a daily basis, which can contribute to hypertension, however it does keep her back pain controlled.    Past Medical History: hyperlipidemia 5/22 LDL 65 - on rosuvastatin  CVA 2002, no residual problems  obesity S/P gastric band - was loosened to half, because of eating issues  OSA Could not tolerate CPAP  hypothyroidism 10/21 TSH at 7.26 - on levothyroxine 112 mcg  fibromyalgia Chronic back pain arthritis, spinal stenosis     Blood Pressure Goal:  130/80  Current Medications: carvedilol 12.5 mg bid   Family Hx: father died from SCD at 93; no other significant family history of heart disease  Social Hx: no tobacco, no alcohol, lots of iced tea  Diet: mostly home cooked, no added salt at table, only occasionally with cooking; veggies fresh and canned, does not rinse before cooking  Exercise: not currently due to back issues  Home BP readings: has not checked BP at home in past month - gets frustrated getting cuff into place  Intolerances: no known cardiac medication intolerances  Labs: 5/22:  Na 142, K 4.4, Glu 96, BUN 18, SCr 0.98, GFR 64   Wt Readings from Last 3 Encounters:  02/14/21 231 lb 3.2 oz (104.9 kg)  01/13/21 230 lb (104.3 kg)  12/20/20 229 lb (103.9 kg)   BP Readings from Last 3 Encounters:  02/14/21 118/84  01/13/21 (!) 132/98  12/20/20 122/82   Pulse Readings from Last 3 Encounters:  02/14/21 99  01/13/21 88  12/20/20 (!) 110    Current Outpatient Medications  Medication Sig Dispense Refill   alendronate (FOSAMAX)  70 MG tablet Take 1 tablet (70 mg total) by mouth every 7 (seven) days. Take with a full glass of water on an empty stomach. 13 tablet 3   aspirin EC 81 MG tablet Take 81 mg by mouth daily.     aspirin-acetaminophen-caffeine (EXCEDRIN MIGRAINE) 250-250-65 MG tablet Take 1 tablet by mouth every 8 (eight) hours as needed for migraine. 30 tablet 0   Biotin 5 MG TABS Take 5 mg by mouth.     buPROPion (WELLBUTRIN SR) 150 MG 12 hr tablet Take 1 tablet (150 mg total) by mouth 2 (two) times daily. 180 tablet 3   carvedilol (COREG) 12.5 MG tablet Take 1 tablet (12.5 mg total) by mouth 2 (two) times daily with a meal. 180 tablet 3   Cholecalciferol (VITAMIN D) 50 MCG (2000 UT) tablet Take 2 tablets (4,000 Units total) by mouth daily.     cyanocobalamin 100 MCG tablet Take 100 mcg by mouth daily.     cyclobenzaprine (FLEXERIL) 10 MG tablet Take 1 tablet (10 mg total) by mouth 2 (two) times daily as needed for muscle spasms. 20 tablet 0   diclofenac (VOLTAREN) 75 MG EC tablet Take 1 tablet (75 mg total) by mouth 2 (two) times daily. 50 tablet 2   gabapentin (NEURONTIN) 300 MG capsule One tablet at bedtime     levothyroxine (SYNTHROID) 112 MCG tablet TAKE 1 TABLET BY MOUTH  DAILY BEFORE BREAKFAST  EQUIVALENT TO EUTHYROX 90 tablet 1   lidocaine (LIDODERM) 5 % Place 1 patch onto the skin daily. Remove & Discard patch within 12 hours or as directed by MD 15 patch 0   Multiple Vitamin (MULTIVITAMIN) tablet Take 1 tablet by mouth daily.     omeprazole (PRILOSEC) 20 MG capsule TAKE 1 CAPSULE BY MOUTH  DAILY 90 capsule 3   rosuvastatin (CRESTOR) 20 MG tablet TAKE 1 TABLET BY MOUTH  DAILY 90 tablet 3   Vilazodone HCl (VIIBRYD) 20 MG TABS Take 1 tablet (20 mg total) by mouth daily. 90 tablet 3   No current facility-administered medications for this visit.    Allergies  Allergen Reactions   Lexapro [Escitalopram Oxalate] Other (See Comments)    Fatigue, sedation   Meperidine Hcl Other (See Comments)    Cardiac  arrest and syncope   Simvastatin Other (See Comments)    REACTION: muscle aches   Tape     Allergic to bandaid adhesive.     Venlafaxine     Intolerant.  Joint aches.   Codeine Itching   Tramadol Itching    Past Medical History:  Diagnosis Date   Abnormal alkaline phosphatase test    with neg eval prev   Arthritis    CAD in native artery 07/13/2020   Chronic diastolic heart failure (HCC) 01/13/2021   Depression    Previously responded to Wellbutrin   Headache(784.0)    Hyperlipidemia    Hypothyroidism    Migraines    Osteoporosis    Fosamax start 10/2020   Stroke (HCC) 12/2000   Thyroid disease    Hypothyroidism    Blood pressure 118/84, pulse 99, resp. rate 17, height 5' 4.5" (1.638 m), weight 231 lb 3.2 oz (104.9 kg), SpO2 95 %.  Essential hypertension Patient with essential hypertension, currently doing well.  Will have her continue with the carvedilol and occasional BP monitoring at home.  Discussed the importance of keeping BP at goal and the potential consequences of elevated readings.  Patient aware to contact office with any BP readings of concern.    Phillips Hay PharmD CPP [facility] [address] [place], [place] [address] [phone]

## 2021-02-14 NOTE — Patient Instructions (Signed)
Thank you for choosing [facility]  Check your blood pressure at home 2-3 times each monthand keep record of the readings.  Take your BP meds as follows:  No changes to medications today.  Bring all of your meds, your BP cuff and your record of home blood pressures to your next appointment.  Exercise as you're able, try to walk approximately 30 minutes per day.  Keep salt intake to a minimum, especially watch canned and prepared boxed foods.  Eat more fresh fruits and vegetables and fewer canned items.  Avoid eating in fast food restaurants.    HOW TO TAKE YOUR BLOOD PRESSURE: Rest 5 minutes before taking your blood pressure.  Don't smoke or drink caffeinated beverages for at least 30 minutes before. Take your blood pressure before (not after) you eat. Sit comfortably with your back supported and both feet on the floor (don't cross your legs). Elevate your arm to heart level on a table or a desk. Use the proper sized cuff. It should fit smoothly and snugly around your bare upper arm. There should be enough room to slip a fingertip under the cuff. The bottom edge of the cuff should be 1 inch above the crease of the elbow. Ideally, take 3 measurements at one sitting and record the average.

## 2021-02-14 NOTE — Assessment & Plan Note (Signed)
Patient with essential hypertension, currently doing well.  Will have her continue with the carvedilol and occasional BP monitoring at home.  Discussed the importance of keeping BP at goal and the potential consequences of elevated readings.  Patient aware to contact office with any BP readings of concern.

## 2021-03-03 ENCOUNTER — Other Ambulatory Visit: Payer: Self-pay | Admitting: Family Medicine

## 2021-03-13 ENCOUNTER — Emergency Department (HOSPITAL_BASED_OUTPATIENT_CLINIC_OR_DEPARTMENT_OTHER): Payer: 59

## 2021-03-13 ENCOUNTER — Encounter (HOSPITAL_BASED_OUTPATIENT_CLINIC_OR_DEPARTMENT_OTHER): Payer: Self-pay | Admitting: Obstetrics and Gynecology

## 2021-03-13 ENCOUNTER — Other Ambulatory Visit: Payer: Self-pay

## 2021-03-13 ENCOUNTER — Emergency Department (HOSPITAL_BASED_OUTPATIENT_CLINIC_OR_DEPARTMENT_OTHER)
Admission: EM | Admit: 2021-03-13 | Discharge: 2021-03-13 | Disposition: A | Discharge status: Home / Self Care | Payer: 59 | Attending: Emergency Medicine | Admitting: Emergency Medicine

## 2021-03-13 DIAGNOSIS — Z7982 Long term (current) use of aspirin: Secondary | ICD-10-CM | POA: Diagnosis not present

## 2021-03-13 DIAGNOSIS — Z79899 Other long term (current) drug therapy: Secondary | ICD-10-CM | POA: Diagnosis not present

## 2021-03-13 DIAGNOSIS — W01198A Fall on same level from slipping, tripping and stumbling with subsequent striking against other object, initial encounter: Secondary | ICD-10-CM | POA: Diagnosis not present

## 2021-03-13 DIAGNOSIS — Z96651 Presence of right artificial knee joint: Secondary | ICD-10-CM | POA: Diagnosis not present

## 2021-03-13 DIAGNOSIS — I110 Hypertensive heart disease with heart failure: Secondary | ICD-10-CM | POA: Insufficient documentation

## 2021-03-13 DIAGNOSIS — E039 Hypothyroidism, unspecified: Secondary | ICD-10-CM | POA: Insufficient documentation

## 2021-03-13 DIAGNOSIS — S8002XA Contusion of left knee, initial encounter: Secondary | ICD-10-CM

## 2021-03-13 DIAGNOSIS — I2510 Atherosclerotic heart disease of native coronary artery without angina pectoris: Secondary | ICD-10-CM | POA: Insufficient documentation

## 2021-03-13 DIAGNOSIS — S8992XA Unspecified injury of left lower leg, initial encounter: Secondary | ICD-10-CM | POA: Diagnosis present

## 2021-03-13 DIAGNOSIS — I5032 Chronic diastolic (congestive) heart failure: Secondary | ICD-10-CM | POA: Insufficient documentation

## 2021-03-13 DIAGNOSIS — S8012XA Contusion of left lower leg, initial encounter: Secondary | ICD-10-CM

## 2021-03-13 NOTE — ED Provider Notes (Signed)
[facility] EMERGENCY DEPT Provider Note   CSN: [id number] Arrival date & time: 03/13/21  2105     History Chief Complaint  Patient presents with   Fall   Leg Pain    Traci [name] is a 65 y.o. female.  65 year old female presents after experiencing direct trauma to her left lower extremity.  States she fell and struck her left shin and knee.  No LOC.  Slight hip pain but able to ambulate.  Denies take any blood thinners.  No treatment use prior to arrival       Past Medical History:  Diagnosis Date   Abnormal alkaline phosphatase test    with neg eval prev   Arthritis    CAD in native artery 07/13/2020   Chronic diastolic heart failure (HCC) 01/13/2021   Depression    Previously responded to Wellbutrin   Headache(784.0)    Hyperlipidemia    Hypothyroidism    Migraines    Osteoporosis    Fosamax start 10/2020   Stroke (HCC) 12/2000   Thyroid disease    Hypothyroidism    Patient Active Problem List   Diagnosis Date Noted   Chronic diastolic heart failure (HCC) 01/13/2021   CAD in native artery 07/13/2020   Advance care planning 02/03/2020   LAP-BAND surgery status 02/03/2020   Memory loss of unknown cause 06/16/2019   Hyperglycemia 05/28/2019   Vitamin D deficiency 12/22/2018   Alkaline phosphatase elevation 12/22/2018   Tremor 12/25/2017   Migraines    Essential hypertension    GERD (gastroesophageal reflux disease) 01/25/2016   Poor fine motor skills 06/02/2014   Rash and nonspecific skin eruption 02/06/2013   Right knee DJD 11/26/2012    Class: Chronic   Leg edema 09/20/2012   Knee pain 08/24/2012   Osteoporosis 08/04/2012   Work-related stress 03/18/2012   Routine general medical examination at a health care facility 07/30/2011   Back pain 06/08/2011   Hypothyroidism 04/06/2010   Hyperlipidemia 04/06/2010   Depression 04/06/2010   Myalgia and myositis 04/06/2010   HEADACHE 04/06/2010   CEREBROVASCULAR ACCIDENT, HX OF  04/06/2010    Past Surgical History:  Procedure Laterality Date   CHOLECYSTECTOMY     ? 20 years ago   LAPAROSCOPIC GASTRIC BANDING  2006   LEFT HEART CATH AND CORONARY ANGIOGRAPHY N/A 11/22/2016   Procedure: Left Heart Cath and Coronary Angiography;  Surgeon: Iran Ouch, MD;  Location: [facility] INVASIVE CV LAB;  Service: Cardiovascular;  Laterality: N/A;   TOTAL KNEE ARTHROPLASTY Right 11/26/2012   Procedure: TOTAL KNEE ARTHROPLASTY- right ;  Surgeon: Velna Ochs, MD;  Location: [facility] OR;  Service: Orthopedics;  Laterality: Right;     OB History     Gravida  0   Para  0   Term  0   Preterm  0   AB  0   Living  0      SAB  0   IAB  0   Ectopic  0   Multiple  0   Live Births  0           Family History  Problem Relation Age of Onset   Stroke Mother    Diabetes Mother    Heart attack Father        Deceased, 15   Cancer Other        Ovarian, Uterine   Heart disease Other    Melanoma Sister    Healthy Brother    Colon cancer Neg  Hx    Breast cancer Neg Hx     Social History   Tobacco Use   Smoking status: Never    Passive exposure: Never   Smokeless tobacco: Never  Vaping Use   Vaping Use: Never used  Substance Use Topics   Alcohol use: No    Alcohol/week: 0.0 standard drinks    Comment: "maybe one on new years"   Drug use: No    Home Medications Prior to Admission medications   Medication Sig Start Date End Date Taking? Authorizing Provider  alendronate (FOSAMAX) 70 MG tablet Take 1 tablet (70 mg total) by mouth every 7 (seven) days. Take with a full glass of water on an empty stomach. 10/17/20   Joaquim Nam, MD  aspirin EC 81 MG tablet Take 81 mg by mouth daily.    [provider]  aspirin-acetaminophen-caffeine (EXCEDRIN MIGRAINE) [phone] MG tablet Take 1 tablet by mouth every 8 (eight) hours as needed for migraine. 11/24/16   Joseph Art, DO  Biotin 5 MG TABS Take 5 mg by mouth.    [provider]  buPROPion  [facility] SR) 150 MG 12 hr tablet TAKE 1 TABLET BY MOUTH  TWICE DAILY 03/04/21   Joaquim Nam, MD  carvedilol (COREG) 12.5 MG tablet Take 1 tablet (12.5 mg total) by mouth 2 (two) times daily with a meal. 01/13/21   Chilton Si, MD  Cholecalciferol (VITAMIN D) 50 MCG (2000 UT) tablet Take 2 tablets (4,000 Units total) by mouth daily. 10/14/20   Joaquim Nam, MD  cyanocobalamin 100 MCG tablet Take 100 mcg by mouth daily.    [provider]  cyclobenzaprine (FLEXERIL) 10 MG tablet Take 1 tablet (10 mg total) by mouth 2 (two) times daily as needed for muscle spasms. 11/07/20   Tegeler, Canary Brim, MD  diclofenac (VOLTAREN) 75 MG EC tablet Take 1 tablet (75 mg total) by mouth 2 (two) times daily. 04/15/20   Lenn Sink, DPM  gabapentin (NEURONTIN) 300 MG capsule One tablet at bedtime 10/08/20   [provider]  levothyroxine (SYNTHROID) 112 MCG tablet TAKE 1 TABLET BY MOUTH  DAILY BEFORE BREAKFAST  EQUIVALENT TO Janann August 12/14/20   Joaquim Nam, MD  lidocaine (LIDODERM) 5 % Place 1 patch onto the skin daily. Remove & Discard patch within 12 hours or as directed by MD 11/07/20   Tegeler, Canary Brim, MD  Multiple Vitamin (MULTIVITAMIN) tablet Take 1 tablet by mouth daily.    [provider]  omeprazole (PRILOSEC) 20 MG capsule TAKE 1 CAPSULE BY MOUTH  DAILY 12/23/20   Joaquim Nam, MD  rosuvastatin (CRESTOR) 20 MG tablet TAKE 1 TABLET BY MOUTH  DAILY 07/28/20   Chilton Si, MD  Vilazodone HCl (VIIBRYD) 20 MG TABS Take 1 tablet (20 mg total) by mouth daily. 09/10/20   Mozingo, Thereasa Solo, NP    Allergies    Lexapro [escitalopram oxalate], Meperidine hcl, Simvastatin, Tape, Venlafaxine, Codeine, and Tramadol  Review of Systems   Review of Systems  All other systems reviewed and are negative.  Physical Exam Updated Vital Signs BP (!) 164/107 (BP Location: Right Arm)   Pulse (!) 107   Temp 98.2 F (36.8 C) (Oral)   Resp 16   Ht 1.626 m (5'  4")   Wt 104.3 kg   SpO2 97%   BMI 39.48 kg/m   Physical Exam Vitals and nursing note reviewed.  Constitutional:      General: She is not in  acute distress.    Appearance: Normal appearance. She is well-developed. She is not toxic-appearing.  HENT:     Head: Normocephalic and atraumatic.  Eyes:     General: Lids are normal.     Conjunctiva/sclera: Conjunctivae normal.     Pupils: Pupils are equal, round, and reactive to light.  Neck:     Thyroid: No thyroid mass.     Trachea: No tracheal deviation.  Cardiovascular:     Rate and Rhythm: Normal rate and regular rhythm.     Heart sounds: Normal heart sounds. No murmur heard.   No gallop.  Pulmonary:     Effort: Pulmonary effort is normal. No respiratory distress.     Breath sounds: Normal breath sounds. No stridor. No decreased breath sounds, wheezing, rhonchi or rales.  Abdominal:     General: There is no distension.     Palpations: Abdomen is soft.     Tenderness: There is no abdominal tenderness. There is no rebound.  Musculoskeletal:        General: Normal range of motion.     Cervical back: Normal range of motion and neck supple.     Left knee: Ecchymosis present. Normal range of motion. Tenderness present.       Legs:  Skin:    General: Skin is warm and dry.     Findings: No abrasion or rash.  Neurological:     Mental Status: She is alert and oriented to person, place, and time. Mental status is at baseline.     GCS: GCS eye subscore is 4. GCS verbal subscore is 5. GCS motor subscore is 6.     Cranial Nerves: Cranial nerves are intact. No cranial nerve deficit.     Sensory: No sensory deficit.     Motor: Motor function is intact.  Psychiatric:        Attention and Perception: Attention normal.        Speech: Speech normal.        Behavior: Behavior normal.    ED Results / Procedures / Treatments   Labs (all labs ordered are listed, but only abnormal results are displayed) Labs Reviewed - No data to  display  EKG None  Radiology No results found.  Procedures Procedures   Medications Ordered in ED Medications - No data to display  ED Course  I have reviewed the triage vital signs and the nursing notes.  Pertinent labs & imaging results that were available during my care of the patient were reviewed by me and considered in my medical decision making (see chart for details).    MDM Rules/Calculators/A&P                          X-rays of tibia and knee are negative.  Will discharge home Final Clinical Impression(s) / ED Diagnoses Final diagnoses:  None    Rx / DC Orders ED Discharge Orders     None        Lorre Nick, MD 03/13/21 2159

## 2021-03-13 NOTE — ED Triage Notes (Signed)
Patient reports pain in the left leg after a fall today around noon. Pulses in both feet equal bilateral. Large area of bruising noted to left shin, patient reports pain in the back of the leg as well.

## 2021-03-30 ENCOUNTER — Telehealth: Payer: Self-pay | Admitting: Neurology

## 2021-03-30 NOTE — Telephone Encounter (Signed)
Patient called with concerns her right arm shakiness has increased within the last month or so. She has been added to the wait list for sooner openings. FYI only unless, you have advice.

## 2021-04-15 ENCOUNTER — Encounter: Payer: Self-pay | Admitting: Neurology

## 2021-04-20 ENCOUNTER — Ambulatory Visit: Payer: 59 | Admitting: Neurology

## 2021-04-20 NOTE — Progress Notes (Deleted)
Follow-up Visit   Date: 04/20/21   Traci [name] MRN: [id number] DOB: May 13, 1956   Interim History: Traci [name] is a 65 y.o. left-handed Caucasian female with hypothyroidism, depression, hyperlipidemia, prior stroke, and heart failure returning to the clinic for follow-up of tremor.  The patient was accompanied to the clinic by self.  Since her last visit in December, she reports no change in her right hand tremor.  Sometimes, she feels that it is more prominent, such as when stressed/anxious.  No tremor in the right leg or left side. She denies weakness, stiffness, or falls.  Her hand writing is become more and more illegible.  She often needs help to write, such as as when leaving a tip or signing. Tremor does not interfere with her daily activities, but the arm can be sore by the end of the day.   History of present illness: In October, she started Rexulti, atypical antipsychotic, for depression.  She was on it two weeks and began having right hand tremor and leg tremor, sometimes so prominent that she would need to sit on her hand.  She had noticed reduced intensity of the tremor, such that it does not flap as hard, but now has a constant low-amplitude tremor of the hand.    She was previously seen here for hand writing changes and bilateral arm pain. Previously, she underwent extensive work-up including EMG, MRI brain/C-spine/L-spine, and serology testing.  EMG showed bilateral C8 radiculopathy and left L3-4 radiculopathy, which was also evidence on imaging.  Laboratory testing including inflammatory/autoimmune diseases, nutritional deficiencies, myasthenia gravis, and myopathy has been unremarkable (see below).  She also was evaluated by Dr. Nickola Major in Rheumatology whose work-up was negative.  She completed neck physiotherapy, without any improvement.    Medications:  Current Outpatient Medications on File Prior to Visit  Medication Sig Dispense Refill   alendronate  (FOSAMAX) 70 MG tablet Take 1 tablet (70 mg total) by mouth every 7 (seven) days. Take with a full glass of water on an empty stomach. 13 tablet 3   aspirin EC 81 MG tablet Take 81 mg by mouth daily.     aspirin-acetaminophen-caffeine (EXCEDRIN MIGRAINE) 250-250-65 MG tablet Take 1 tablet by mouth every 8 (eight) hours as needed for migraine. 30 tablet 0   Biotin 5 MG TABS Take 5 mg by mouth.     buPROPion (WELLBUTRIN SR) 150 MG 12 hr tablet TAKE 1 TABLET BY MOUTH  TWICE DAILY 180 tablet 3   carvedilol (COREG) 12.5 MG tablet Take 1 tablet (12.5 mg total) by mouth 2 (two) times daily with a meal. 180 tablet 3   Cholecalciferol (VITAMIN D) 50 MCG (2000 UT) tablet Take 2 tablets (4,000 Units total) by mouth daily.     cyanocobalamin 100 MCG tablet Take 100 mcg by mouth daily.     cyclobenzaprine (FLEXERIL) 10 MG tablet Take 1 tablet (10 mg total) by mouth 2 (two) times daily as needed for muscle spasms. 20 tablet 0   diclofenac (VOLTAREN) 75 MG EC tablet Take 1 tablet (75 mg total) by mouth 2 (two) times daily. 50 tablet 2   gabapentin (NEURONTIN) 300 MG capsule One tablet at bedtime     levothyroxine (SYNTHROID) 112 MCG tablet TAKE 1 TABLET BY MOUTH  DAILY BEFORE BREAKFAST  EQUIVALENT TO EUTHYROX 90 tablet 1   lidocaine (LIDODERM) 5 % Place 1 patch onto the skin daily. Remove & Discard patch within 12 hours or as directed by MD 15 patch  0   Multiple Vitamin (MULTIVITAMIN) tablet Take 1 tablet by mouth daily.     omeprazole (PRILOSEC) 20 MG capsule TAKE 1 CAPSULE BY MOUTH  DAILY 90 capsule 3   rosuvastatin (CRESTOR) 20 MG tablet TAKE 1 TABLET BY MOUTH  DAILY 90 tablet 3   Vilazodone HCl (VIIBRYD) 20 MG TABS Take 1 tablet (20 mg total) by mouth daily. 90 tablet 3   No current facility-administered medications on file prior to visit.    Allergies:  Allergies  Allergen Reactions   Lexapro [Escitalopram Oxalate] Other (See Comments)    Fatigue, sedation   Meperidine Hcl Other (See Comments)     Cardiac arrest and syncope   Simvastatin Other (See Comments)    REACTION: muscle aches   Tape     Allergic to bandaid adhesive.     Venlafaxine     Intolerant.  Joint aches.   Codeine Itching   Tramadol Itching    Vital Signs:  There were no vitals taken for this visit.  Neurological Exam: MENTAL STATUS including orientation to time, place, person, recent and remote memory, attention span and concentration, language, and fund of knowledge is normal.  Speech is not dysarthric.  CRANIAL NERVES:  No visual field defects.  Pupils equal round and reactive to light.  Normal conjugate, extra-ocular eye movements in all directions of gaze.  No ptosis   MOTOR:  Motor strength is 5/5 in all extremities. Low amplitude, high frequency resting hand tremor.  Dissipates with action.   No pronator drift.  Tone is normal.    MSRs:  Reflexes are 2+/4 throughout.  SENSORY:  Intact to vibration throughout.  COORDINATION/GAIT:  Normal finger-to- nose-finger.  Intact rapid alternating movements bilaterally.  Gait narrow based and stable. Right hand tremor is more noticeable when walking.   Data: n/a  IMPRESSION/PLAN: Drug-induced tremor, which started after taking antipsychotic, Rexulti in October 2021.  She has been off medication for 6 months, but continues to have tremor.  I discussed that we can order DaT scan to see if this is a parkinsonian tremor or follow her clinically.  Given that her tremor is very mild and does not interfere with daily activities, it was decided to monitor.  If symptoms get worse over the next 6 months, DaT scan will be ordered.  Return to clinic in 6 months  Thank you for allowing me to participate in patient's care.  If I can answer any additional questions, I would be pleased to do so.    Sincerely,    Libertie Hausler K. Allena Katz, DO

## 2021-05-15 ENCOUNTER — Other Ambulatory Visit: Payer: Self-pay | Admitting: Family Medicine

## 2021-05-15 DIAGNOSIS — M810 Age-related osteoporosis without current pathological fracture: Secondary | ICD-10-CM

## 2021-05-16 ENCOUNTER — Ambulatory Visit: Payer: 59 | Admitting: Neurology

## 2021-05-24 ENCOUNTER — Other Ambulatory Visit (INDEPENDENT_AMBULATORY_CARE_PROVIDER_SITE_OTHER): Payer: 59

## 2021-05-24 ENCOUNTER — Other Ambulatory Visit: Payer: Self-pay

## 2021-05-24 DIAGNOSIS — M810 Age-related osteoporosis without current pathological fracture: Secondary | ICD-10-CM

## 2021-05-24 LAB — TSH: TSH: 23.42 u[IU]/mL — ABNORMAL HIGH (ref 0.35–5.50)

## 2021-05-24 LAB — VITAMIN D 25 HYDROXY (VIT D DEFICIENCY, FRACTURES): VITD: 26.12 ng/mL — ABNORMAL LOW (ref 30.00–100.00)

## 2021-05-26 ENCOUNTER — Other Ambulatory Visit: Payer: Self-pay

## 2021-05-26 ENCOUNTER — Encounter: Payer: Self-pay | Admitting: Neurology

## 2021-05-26 ENCOUNTER — Ambulatory Visit: Payer: 59 | Admitting: Neurology

## 2021-05-26 VITALS — BP 141/80 | HR 92 | Ht 65.0 in | Wt 238.0 lb

## 2021-05-26 DIAGNOSIS — R251 Tremor, unspecified: Secondary | ICD-10-CM | POA: Diagnosis not present

## 2021-05-26 MED ORDER — CARBIDOPA-LEVODOPA 25-100 MG PO TABS: 1.0000 | ORAL_TABLET | Freq: Three times a day (TID) | ORAL | 3 refills | Status: DC

## 2021-05-26 NOTE — Progress Notes (Signed)
Follow-up Visit   Date: 05/26/21   Traci [name] MRN: [id number] DOB: 27-Apr-1956   Interim History: Traci [name] is a 65 y.o. left-handed Caucasian female with hypothyroidism, depression, hyperlipidemia, prior stroke, and heart failure returning to the clinic for follow-up of tremor.  The patient was accompanied to the clinic by self.  Over the past 6 months, her right hand and arm tremor has become more bothersome.  Sometimes, she finds herself having to sit on her hand to get some relief.  Fortunately, she is left handed, so many of her tasks such as uring utensils, brushing, etc she does not have difficulty with.  Rarely, she noticed a tremor of her mouth and right leg.     History of present illness: In October, she started Rexulti, atypical antipsychotic, for depression.  She was on it two weeks and began having right hand tremor and leg tremor, sometimes so prominent that she would need to sit on her hand.  She had noticed reduced intensity of the tremor, such that it does not flap as hard, but now has a constant low-amplitude tremor of the hand.    She was previously seen here for hand writing changes and bilateral arm pain. Previously, she underwent extensive work-up including EMG, MRI brain/C-spine/L-spine, and serology testing.  EMG showed bilateral C8 radiculopathy and left L3-4 radiculopathy, which was also evidence on imaging.  Laboratory testing including inflammatory/autoimmune diseases, nutritional deficiencies, myasthenia gravis, and myopathy has been unremarkable (see below).  She also was evaluated by Dr. Nickola Major in Rheumatology whose work-up was negative.  She completed neck physiotherapy, without any improvement.    Medications:  Current Outpatient Medications on File Prior to Visit  Medication Sig Dispense Refill   alendronate (FOSAMAX) 70 MG tablet Take 1 tablet (70 mg total) by mouth every 7 (seven) days. Take with a full glass of water on an empty  stomach. 13 tablet 3   aspirin EC 81 MG tablet Take 81 mg by mouth daily.     aspirin-acetaminophen-caffeine (EXCEDRIN MIGRAINE) 250-250-65 MG tablet Take 1 tablet by mouth every 8 (eight) hours as needed for migraine. 30 tablet 0   Biotin 5 MG TABS Take 5 mg by mouth.     buPROPion (WELLBUTRIN SR) 150 MG 12 hr tablet TAKE 1 TABLET BY MOUTH  TWICE DAILY 180 tablet 3   carvedilol (COREG) 12.5 MG tablet Take 1 tablet (12.5 mg total) by mouth 2 (two) times daily with a meal. 180 tablet 3   Cholecalciferol (VITAMIN D) 50 MCG (2000 UT) tablet Take 2 tablets (4,000 Units total) by mouth daily.     cyanocobalamin 100 MCG tablet Take 100 mcg by mouth daily.     cyclobenzaprine (FLEXERIL) 10 MG tablet Take 1 tablet (10 mg total) by mouth 2 (two) times daily as needed for muscle spasms. 20 tablet 0   diclofenac (VOLTAREN) 75 MG EC tablet Take 1 tablet (75 mg total) by mouth 2 (two) times daily. 50 tablet 2   gabapentin (NEURONTIN) 300 MG capsule One tablet at bedtime     levothyroxine (SYNTHROID) 112 MCG tablet TAKE 1 TABLET BY MOUTH  DAILY BEFORE BREAKFAST  EQUIVALENT TO EUTHYROX 90 tablet 1   lidocaine (LIDODERM) 5 % Place 1 patch onto the skin daily. Remove & Discard patch within 12 hours or as directed by MD 15 patch 0   Multiple Vitamin (MULTIVITAMIN) tablet Take 1 tablet by mouth daily.     omeprazole (PRILOSEC) 20 MG capsule TAKE  1 CAPSULE BY MOUTH  DAILY 90 capsule 3   rosuvastatin (CRESTOR) 20 MG tablet TAKE 1 TABLET BY MOUTH  DAILY 90 tablet 3   Vilazodone HCl (VIIBRYD) 20 MG TABS Take 1 tablet (20 mg total) by mouth daily. 90 tablet 3   No current facility-administered medications on file prior to visit.    Allergies:  Allergies  Allergen Reactions   Lexapro [Escitalopram Oxalate] Other (See Comments)    Fatigue, sedation   Meperidine Hcl Other (See Comments)    Cardiac arrest and syncope   Simvastatin Other (See Comments)    REACTION: muscle aches   Tape     Allergic to bandaid  adhesive.     Venlafaxine     Intolerant.  Joint aches.   Codeine Itching   Tramadol Itching    Vital Signs:  BP (!) 141/80   Pulse 92   Ht 5\' 5"  (1.651 m)   Wt 238 lb (108 kg)   SpO2 96%   BMI 39.61 kg/m   Neurological Exam: MENTAL STATUS including orientation to time, place, person, recent and remote memory, attention span and concentration, language, and fund of knowledge is normal.  Speech is not dysarthric.  CRANIAL NERVES:  No visual field defects.  Pupils equal round and reactive to light.  Normal conjugate, extra-ocular eye movements in all directions of gaze.  No ptosis   MOTOR:  Motor strength is 5/5 in all extremities. Low amplitude, high frequency resting hand and arm tremor.  Dissipates with action.   No pronator drift.  Tone is normal.    MSRs:  Reflexes are 2+/4 throughout.  SENSORY:  Intact to vibration throughout.  COORDINATION/GAIT:  Normal finger-to- nose-finger.  Intact rapid alternating movements bilaterally.  Gait narrow based and stable. Right hand tremor is more noticeable when walking.   Data: n/a  IMPRESSION/PLAN: Right hand tremor, worsening, ?drug-induced vs parkinsonian.  Symptoms started after taking antipsychotic, Rexulti in October 2021.  She has been off medication for almost 1 year, and continues to have resting tremor.  At this point, the next step is to evaluate whether this is a parkinsonian tremor with DaT scan.  I will also give her a trial of sinemet to see if this helps.  She will start sinemet 25/100 half tablet three times daily and titrate to 1 tablet three times daily.  Return to clinic in 3-4 months   Thank you for allowing me to participate in patient's care.  If I can answer any additional questions, I would be pleased to do so.    Sincerely,    Adrine Hayworth K. November 2021, DO

## 2021-05-26 NOTE — Patient Instructions (Addendum)
Start Carbidopa Levodopa as follows at least 30-min prior to meals:     AM  Afternoon   Evening   Week 1:  1/2 tab  1/2 tab   1/2 tab  Week 2:   1/2 tab  1/2 tab   1 tab  Week 3:  1/2 tab  1 tab   1 tab  Week 4:  1 tab  1 tab   1 tab  *Avoid taking with protein products, such as milk, meat, cheese  *if you develop nausea, take with crackers  2.  We will order DaT scan  Return to clinic 3-4 month

## 2021-05-31 ENCOUNTER — Encounter: Payer: Self-pay | Admitting: Family Medicine

## 2021-05-31 ENCOUNTER — Other Ambulatory Visit: Payer: Self-pay

## 2021-05-31 ENCOUNTER — Ambulatory Visit (INDEPENDENT_AMBULATORY_CARE_PROVIDER_SITE_OTHER): Payer: 59 | Admitting: Family Medicine

## 2021-05-31 VITALS — BP 126/80 | HR 80 | Temp 98.0°F | Ht 65.0 in | Wt 233.0 lb

## 2021-05-31 DIAGNOSIS — Z7189 Other specified counseling: Secondary | ICD-10-CM | POA: Diagnosis not present

## 2021-05-31 DIAGNOSIS — E559 Vitamin D deficiency, unspecified: Secondary | ICD-10-CM | POA: Diagnosis not present

## 2021-05-31 DIAGNOSIS — E039 Hypothyroidism, unspecified: Secondary | ICD-10-CM

## 2021-05-31 DIAGNOSIS — E7800 Pure hypercholesterolemia, unspecified: Secondary | ICD-10-CM

## 2021-05-31 DIAGNOSIS — Z0001 Encounter for general adult medical examination with abnormal findings: Secondary | ICD-10-CM

## 2021-05-31 DIAGNOSIS — I10 Essential (primary) hypertension: Secondary | ICD-10-CM

## 2021-05-31 DIAGNOSIS — Z0000 Encounter for general adult medical examination without abnormal findings: Secondary | ICD-10-CM

## 2021-05-31 DIAGNOSIS — R251 Tremor, unspecified: Secondary | ICD-10-CM | POA: Diagnosis not present

## 2021-05-31 DIAGNOSIS — F32A Depression, unspecified: Secondary | ICD-10-CM

## 2021-05-31 DIAGNOSIS — M810 Age-related osteoporosis without current pathological fracture: Secondary | ICD-10-CM

## 2021-05-31 DIAGNOSIS — M549 Dorsalgia, unspecified: Secondary | ICD-10-CM

## 2021-05-31 MED ORDER — LEVOTHYROXINE SODIUM 112 MCG PO TABS: ORAL_TABLET | ORAL | Status: DC

## 2021-05-31 NOTE — Patient Instructions (Addendum)
I'll await your DaT scan results.   Increase the thyroid medicine and vitamin D.  Recheck labs in about 3 months.  Take care.  Glad to see you.

## 2021-05-31 NOTE — Progress Notes (Addendum)
This visit occurred during the SARS-CoV-2 public health emergency.  Safety protocols were in place, including screening questions prior to the visit, additional usage of staff PPE, and extensive cleaning of exam room while observing appropriate contact time as indicated for disinfecting solutions.  CPE- See plan.  Routine anticipatory guidance given to patient.  See health maintenance.  The possibility exists that previously documented standard health maintenance information may have been brought forward from a previous encounter into this note.  If needed, that same information has been updated to reflect the current situation based on today's encounter.    Flu 2022 Shingles 2019 PNA discussed with patient Tetanus 2012 COVID-vaccine previously done Colon cancer screening options discussed with patient.  Cologuard ordered. Breast cancer screening discussed with patient.  She will call about follow-up Bone density test done 2021 Advance directive discussed with patient.  Sister Sherrlyn Hock designated if patient were incapacitated.  Then Martie Lee Langeneggar [phone]) designated if patient were incapacitated.   Cognitive function addressed- see scanned forms- and if abnormal then additional documentation follows.   Whisper hearing intact. EKG previously in chart.  TSH elevated.  She has noted throat clearing but food doesn't get stuck.  Compliant with tx, routine med use d/w pt.  Labs d/w pt.     R hand tremor.  On sinemet recently.  She is going to get a DAT scan.she has noted occ lip quivering.  She had fallen a few times over the last few years.  One time she didn't pick her foot up to get over the curb.  Another time she fell getting up on a chair to get higher to check a an Scientist, water quality.  3rd time she fell over the curb on [facility].  Not lightheaded.   Osteoporosis on fosamax.  Vit D low.  DXA 2021.  D/w pt about inc vit replacement, up to 4000 IU per day.    Mood d/w pt.  Mood is  clearly better on wellbutrin with vilazodone.   Termor clearly predate vilazodone.    Hypertension:    Using medication without problems or lightheadedness: yes Chest pain with exertion: no Edema: bruising from fall prev noted but edematous o/w.   Short of breath: she has to occ stop due to her back pain and also from deconditioning.  She sleeps on slight incline at baseline.  This is not changed.  She is retiring on Friday.  D/w pt.    Elevated Cholesterol: Using medications without problems:yes Muscle aches: not from statin.   Diet compliance: yes Exercise: limited by back pain.    Back pain per [facility] with less effect from injection prev.  She is considering options with them.    PMH and SH reviewed  Meds, vitals, and allergies reviewed.   ROS: Per HPI.  Unless specifically indicated otherwise in HPI, the patient denies:  General: fever. Eyes: acute vision changes ENT: sore throat Cardiovascular: chest pain Respiratory: SOB GI: vomiting GU: dysuria Musculoskeletal: acute back pain Derm: acute rash Neuro: acute motor dysfunction Psych: worsening mood Endocrine: polydipsia Heme: bleeding Allergy: hayfever  GEN: nad, alert and oriented HEENT: ncat NECK: supple w/o LA CV: rrr. PULM: ctab, no inc wob ABD: soft, +bs EXT: trace BLE edema SKIN: no acute rash R hand tremor noted at baseline.

## 2021-06-02 DIAGNOSIS — Z0001 Encounter for general adult medical examination with abnormal findings: Secondary | ICD-10-CM | POA: Insufficient documentation

## 2021-06-02 DIAGNOSIS — Z0000 Encounter for general adult medical examination without abnormal findings: Secondary | ICD-10-CM | POA: Insufficient documentation

## 2021-06-02 NOTE — Assessment & Plan Note (Signed)
Osteoporosis on fosamax.  Vit D low.  DXA 2021.  D/w pt about inc vit replacement, up to 4000 IU per day.  No adverse effect on Fosamax.  Would continue as is.  We can recheck vitamin D in about 3 months.

## 2021-06-02 NOTE — Assessment & Plan Note (Signed)
Continue carvedilol.  Blood pressure controlled.

## 2021-06-02 NOTE — Assessment & Plan Note (Signed)
Mood is clearly better on wellbutrin with vilazodone.   Termor clearly predate vilazodone.  Per outside clinic.  I will defer.  She agrees.  Okay for outpatient follow-up.

## 2021-06-02 NOTE — Assessment & Plan Note (Signed)
Continue work on diet.  Continue to exercise within the limits of her back pain.  Continue Crestor.

## 2021-06-02 NOTE — Assessment & Plan Note (Signed)
With worsening handwriting noted.  She is going to get a DaT scan per neurology.  Is unclear to me if her history of falls are related.  We discussed the rationale of DaTscan.  Fall cautions discussed with patient.

## 2021-06-02 NOTE — Assessment & Plan Note (Signed)
Flu 2022 Shingles 2019 PNA discussed with patient Tetanus 2012 COVID-vaccine previously done Colon cancer screening options discussed with patient.  Cologuard ordered. Breast cancer screening discussed with patient.  She will call about follow-up Bone density test done 2021 Advance directive discussed with patient.  Sister Sherrlyn Hock designated if patient were incapacitated.  Then Martie Lee Langeneggar [phone]) designated if patient were incapacitated.   Cognitive function addressed- see scanned forms- and if abnormal then additional documentation follows.

## 2021-06-02 NOTE — Assessment & Plan Note (Signed)
Increase levothyroxine to 1 pill daily except for 1.5 tabs on Saturdays.  Recheck TSH in about 3 months.  She agrees with plan.

## 2021-06-02 NOTE — Assessment & Plan Note (Signed)
Sister [name] [name] designated if patient were incapacitated. Then Sabrina Langeneggar [phone]) designated if patient were incapacitated.  

## 2021-06-02 NOTE — Assessment & Plan Note (Signed)
Per [facility].  She is considering options.  She has had less effect from injections recently.

## 2021-06-03 ENCOUNTER — Ambulatory Visit: Payer: 59 | Admitting: Neurology

## 2021-06-09 NOTE — Addendum Note (Signed)
Addended by: Joaquim Nam on: 06/09/2021 02:36 PM   Modules accepted: Level of Service

## 2021-06-13 ENCOUNTER — Other Ambulatory Visit: Payer: Self-pay | Admitting: Family Medicine

## 2021-06-13 ENCOUNTER — Telehealth: Payer: Self-pay | Admitting: Family Medicine

## 2021-06-13 DIAGNOSIS — Z1231 Encounter for screening mammogram for malignant neoplasm of breast: Secondary | ICD-10-CM

## 2021-06-13 DIAGNOSIS — Z1211 Encounter for screening for malignant neoplasm of colon: Secondary | ICD-10-CM

## 2021-06-13 NOTE — Telephone Encounter (Signed)
Cologuard has been ordered and called patient and lmtcb

## 2021-06-13 NOTE — Telephone Encounter (Signed)
Pt called in wanted to know status of Cologuard  kit being sent . Would like a call back [phone]

## 2021-06-14 NOTE — Telephone Encounter (Signed)
Related the message °

## 2021-06-16 ENCOUNTER — Ambulatory Visit: Payer: 59

## 2021-06-28 ENCOUNTER — Ambulatory Visit: Payer: 59

## 2021-07-14 ENCOUNTER — Other Ambulatory Visit: Payer: Self-pay

## 2021-07-14 ENCOUNTER — Telehealth: Payer: Self-pay | Admitting: Neurology

## 2021-07-14 DIAGNOSIS — R251 Tremor, unspecified: Secondary | ICD-10-CM

## 2021-07-14 NOTE — Telephone Encounter (Signed)
Pt called in stating she was supposed to be referred to [facility] to get a Parkinson's test done back in September and she hasn't heard anything from that.

## 2021-07-15 NOTE — Telephone Encounter (Signed)
Called patient and informed her that I resent her Dat Scan approval for scheduling. Patient stated that Traci [name] has called her and got her scheduled. Patient thanked me for the call and had no further questions or concerns.

## 2021-07-26 ENCOUNTER — Ambulatory Visit (INDEPENDENT_AMBULATORY_CARE_PROVIDER_SITE_OTHER): Payer: Medicare Other | Admitting: Family Medicine

## 2021-07-26 ENCOUNTER — Telehealth: Payer: Self-pay | Admitting: Neurology

## 2021-07-26 ENCOUNTER — Other Ambulatory Visit: Payer: Self-pay

## 2021-07-26 ENCOUNTER — Encounter: Payer: Self-pay | Admitting: Family Medicine

## 2021-07-26 DIAGNOSIS — E559 Vitamin D deficiency, unspecified: Secondary | ICD-10-CM

## 2021-07-26 DIAGNOSIS — E039 Hypothyroidism, unspecified: Secondary | ICD-10-CM | POA: Diagnosis not present

## 2021-07-26 DIAGNOSIS — R251 Tremor, unspecified: Secondary | ICD-10-CM

## 2021-07-26 LAB — TSH: TSH: 14.74 u[IU]/mL — ABNORMAL HIGH (ref 0.35–5.50)

## 2021-07-26 LAB — VITAMIN D 25 HYDROXY (VIT D DEFICIENCY, FRACTURES): VITD: 35.52 ng/mL (ref 30.00–100.00)

## 2021-07-26 NOTE — Telephone Encounter (Addendum)
She may take all her medications as she is taking, except she should stop Wellbutrin 48 hours prior to her test.  Since she is on Wellbutrin 150mg  twice daily, I recommend that she reduce it to 1 tablet daily one week prior to the study, then stop it 2 days prior to her study.  She can restart following her study.

## 2021-07-26 NOTE — Patient Instructions (Signed)
I'll await your follow up notes.  Go to the lab on the way out.   If you have mychart we'll likely use that to update you.    Take care.  Glad to see you. Don't change your meds for now.

## 2021-07-26 NOTE — Progress Notes (Signed)
This visit occurred during the SARS-CoV-2 public health emergency.  Safety protocols were in place, including screening questions prior to the visit, additional usage of staff PPE, and extensive cleaning of exam room while observing appropriate contact time as indicated for disinfecting solutions.  Hypothyroidism.  Recheck TSH pending.  No neck mass.  Compliant.  Routine instructions discussed with patient regarding taking thyroid medication.  R leg in CAM walker, had see ortho at [facility] after an injury.  Has ortho f/u for R distal tibial fx.  Still on fosamax.  Still on vit D. Recheck vit D pending.  See notes on labs.  She has retired in the meantime.    She has DAT scan pending per neuro.  Still on sinemet. She noted R hand cramping in the middle of the night but it resolved with stretching.  Normal ROM now.    Meds, vitals, and allergies reviewed.   ROS: Per HPI unless specifically indicated in ROS section   GEN: nad, alert and oriented HEENT: ncat NECK: supple w/o LA CV: rrr. PULM: ctab, no inc wob ABD: soft, +bs EXT: no edema SKIN: no acute rash R foot in CAM walker.   R hand tremor noted at baseline.  Normal range of motion with flexion and extension of the fingers.

## 2021-07-26 NOTE — Telephone Encounter (Signed)
Pt would like to know if she is supposed to take her meds prior to her Datscan

## 2021-07-27 ENCOUNTER — Other Ambulatory Visit: Payer: Self-pay | Admitting: Family Medicine

## 2021-07-27 DIAGNOSIS — E039 Hypothyroidism, unspecified: Secondary | ICD-10-CM

## 2021-07-27 MED ORDER — LEVOTHYROXINE SODIUM 112 MCG PO TABS: ORAL_TABLET | ORAL | Status: DC

## 2021-07-27 NOTE — Assessment & Plan Note (Signed)
With follow-up DAT scan pending.  I will await those results.  Continue Sinemet in the meantime.  Unclear if the cramping noted at night in the right hand is related.  Normal range of motion now.

## 2021-07-27 NOTE — Telephone Encounter (Signed)
Called patient and left a message for a call back.  

## 2021-07-27 NOTE — Assessment & Plan Note (Signed)
Continue replacement.  See notes on TSH level.  No thyromegaly noted on exam.

## 2021-07-27 NOTE — Assessment & Plan Note (Signed)
Noted in the setting of distal tibial fracture, in CAM Walker boot.  She is going to follow-up with orthopedics.  See notes on vitamin D level.

## 2021-08-01 NOTE — Telephone Encounter (Signed)
Called patient and left a message for a call back.  

## 2021-08-02 NOTE — Telephone Encounter (Signed)
Called patient and left a message asking patient to give me a call back so I can go over instructions prior to her scan.

## 2021-08-03 NOTE — Telephone Encounter (Signed)
I have sent a message to patient on Mychart with instructions.

## 2021-08-08 NOTE — Telephone Encounter (Signed)
Pt called in returning Mahina's call. She stated she will be out of the house until about 1:30 PM today.

## 2021-08-08 NOTE — Telephone Encounter (Signed)
Returned patients call (See patient message for Dat Scan) and left a message for a call back.

## 2021-08-10 ENCOUNTER — Telehealth: Payer: Self-pay | Admitting: Family Medicine

## 2021-08-10 NOTE — Telephone Encounter (Signed)
Pt called in wanting lab results on 11/22 visit. Pt cannot get in my chart because she does not have access to a computer

## 2021-08-11 ENCOUNTER — Encounter (HOSPITAL_COMMUNITY)
Admission: RE | Admit: 2021-08-11 | Discharge: 2021-08-11 | Disposition: A | Discharge status: Home / Self Care | Payer: Medicare Other | Source: Ambulatory Visit | Attending: Neurology | Admitting: Neurology

## 2021-08-11 ENCOUNTER — Other Ambulatory Visit: Payer: Self-pay

## 2021-08-11 DIAGNOSIS — R251 Tremor, unspecified: Secondary | ICD-10-CM | POA: Insufficient documentation

## 2021-08-11 MED ORDER — POTASSIUM IODIDE (ANTIDOTE) 130 MG PO TABS
ORAL_TABLET | ORAL | Status: AC
  Administered 2021-08-11: 130 mg via ORAL
  Filled 2021-08-11: qty 1

## 2021-08-11 MED ORDER — POTASSIUM IODIDE (ANTIDOTE) 130 MG PO TABS: 130.0000 mg | ORAL_TABLET | Freq: Once | ORAL | Status: AC

## 2021-08-11 MED ORDER — IOFLUPANE I 123 185 MBQ/2.5ML IV SOLN
5.0500 | Freq: Once | INTRAVENOUS | Status: AC | PRN
  Administered 2021-08-11: 5.05 via INTRAVENOUS

## 2021-08-11 NOTE — Telephone Encounter (Signed)
LMTCB

## 2021-08-12 NOTE — Telephone Encounter (Signed)
Spoke with patient about lab results and scheduled lab appt for 11/02/21 at 9:30 am.

## 2021-08-16 ENCOUNTER — Telehealth: Payer: Self-pay | Admitting: Neurology

## 2021-08-16 NOTE — Telephone Encounter (Signed)
Patient said she has been playing phone tag with mahina. She will be at home till 5

## 2021-08-16 NOTE — Telephone Encounter (Signed)
See result note. Patient has been contacted in regards to her Dat Scan results.

## 2021-08-25 ENCOUNTER — Encounter: Payer: Self-pay | Admitting: Neurology

## 2021-08-26 ENCOUNTER — Other Ambulatory Visit: Payer: Self-pay

## 2021-08-26 ENCOUNTER — Telehealth (INDEPENDENT_AMBULATORY_CARE_PROVIDER_SITE_OTHER): Payer: Medicare Other | Admitting: Neurology

## 2021-08-26 ENCOUNTER — Ambulatory Visit: Payer: 59 | Admitting: Neurology

## 2021-08-26 VITALS — Ht 65.0 in | Wt 230.0 lb

## 2021-08-26 DIAGNOSIS — G20 Parkinson's disease: Secondary | ICD-10-CM | POA: Diagnosis not present

## 2021-08-26 MED ORDER — CARBIDOPA-LEVODOPA ER 25-100 MG PO TBCR: 1.0000 | EXTENDED_RELEASE_TABLET | Freq: Every day | ORAL | 3 refills | Status: DC

## 2021-08-26 NOTE — Progress Notes (Signed)
° °  Due to the COVID-19 crisis, this telephone visit was done via telephone from my office and it was initiated and consent given by this patient and or family.  Telephone (Audio) Visit The purpose of this telephone visit is to provide medical care while limiting exposure to the novel coronavirus.    Consent was obtained for telephone visit and initiated by pt/family:  Yes.   Answered questions that patient had about telehealth interaction:  Yes.   I discussed the limitations, risks, security and privacy concerns of performing an evaluation and management service by telephone. I also discussed with the patient that there may be a patient responsible charge related to this service. The patient expressed understanding and agreed to proceed.  Pt location: Home Physician Location: office Name of referring provider:  Joaquim Nam, MD I connected with .Traci [name] at patients initiation/request on 08/26/2021 at 11:50 AM EST by telephone and verified that I am speaking with the correct person using two identifiers.  Pt MRN:  [id number] Pt DOB:  1956-04-01  Assessment/Plan:   Parkinson's disease manifesting with right hand tremor and hand writing change, confirmed by DAT scan. Today, she is also describing morning dystonia  - Continue sinemet 25/100 IR three times daily  - Start sinemet 25/100 ER at bedtime to help with morning dystonia  - Start out-patient PT for LSVT  - I will ask Misty our Child psychotherapist for community resources for Parkinson's disease.   - Questions were addressed to the best of my ability   Subjective:   This is a delightful 65 year-old female who has seen me for tremor and hand writing changes. She underwent DAT scan which confirmed parkinson's disease.  She has been taking sinemet 25/100mg  three times daily and has noticed it has helped slightly with her tremor.  Her friends have noticed that the intensity of her tremors is less.  Over the past few weeks, she has  noticed new clawing of the right hand upon awakening. She has to manually use her left hand to straight it out, which it will, after sometime.  No problems with balance or walking.  She had many questions regarding the course of parkinson's and what she can expect overtime.    DAT scan 08/12/2021: Asymmetric decreased striatal Ioflupane activity as above. Loss of activity greater on the LEFT than the RIGHT. This pattern suggest Parkinsonian syndrome pathology.   Of note, DaTSCAN is not diagnostic of Parkinsonian syndromes, which remains a clinical diagnosis. DaTscan is an adjuvant test to aid in the clinical diagnosis of Parkinsonian syndromes.   Objective:   Vitals:   08/25/21 1432  Weight: 230 lb (104.3 kg)  Height: 5\' 5"  (1.651 m)     Follow Up Instructions:     I discussed the assessment and treatment plan with the patient. The patient was provided an opportunity to ask questions and all were answered. The patient agreed with the plan and demonstrated an understanding of the instructions.   The patient was advised to call back or seek an in-person evaluation if the symptoms worsen or if the condition fails to improve as anticipated.  Return to clinic in 3-4 months  Total Time spent in visit with the patient was:  23 min, of which 100% of the time was spent in counseling and/or coordinating care.   Pt understands and agrees with the plan of care outlined.     , DO

## 2021-09-06 ENCOUNTER — Telehealth: Payer: Self-pay | Admitting: Neurology

## 2021-09-06 NOTE — Telephone Encounter (Signed)
Called patient and left a message for a call back. Per [name] we would like to see if patient can afford with good rx. Found some options costing as low as 19.10 for a 90 day supply at [facility].

## 2021-09-06 NOTE — Telephone Encounter (Signed)
Please let pt know that the new medication is sinemet extended release (CR) and taken at bedtime only. She needs to continue sinemet IR (immediate release) three times daily, as she was taking.

## 2021-09-06 NOTE — Telephone Encounter (Signed)
Patient would like a call back to discuss her medication that was changed. She stated it was 1 TID, now its 1x day time released.  Carbidopa levo

## 2021-09-06 NOTE — Telephone Encounter (Signed)
Called patient and left a message for a call back.   Patient returned call. Patient wanted to know if there was anything else she could take in place of the Sinemet 25/100 ER at bedtime? She stated it cost $64 and she can't afford that right now.   I did mention to patient that there is Good Rx prices that might be cheaper for her. I will research. I informed patient that I will call her back once I hear back from [name]

## 2021-09-07 ENCOUNTER — Other Ambulatory Visit: Payer: Self-pay

## 2021-09-12 ENCOUNTER — Other Ambulatory Visit: Payer: Self-pay | Admitting: Adult Health

## 2021-09-12 ENCOUNTER — Telehealth: Payer: Self-pay | Admitting: Neurology

## 2021-09-12 DIAGNOSIS — F411 Generalized anxiety disorder: Secondary | ICD-10-CM

## 2021-09-12 DIAGNOSIS — F331 Major depressive disorder, recurrent, moderate: Secondary | ICD-10-CM

## 2021-09-12 NOTE — Telephone Encounter (Signed)
Called patient and informed her that she can try to use her Good Rx card to receive a discount for a 90 day supply. Informed patient that I have looked online and found [facility] for $19.10 for a 90 day supply. Patient will let me know if she has any issues when contacting her pharmacy.   Patient also wanted to let Dr. Allena Katz know that she will be selling her house and will be moving to [facility] mid February and would like to know if we can send a referral for Physical Therapy in that city. Informed patient I would ask Dr. Allena Katz and let her know as soon as I hear back.

## 2021-09-12 NOTE — Telephone Encounter (Signed)
Patient is returning a call to mahina °

## 2021-09-13 ENCOUNTER — Telehealth: Payer: Self-pay | Admitting: Family Medicine

## 2021-09-13 MED ORDER — LEVOTHYROXINE SODIUM 112 MCG PO TABS: ORAL_TABLET | ORAL | 1 refills | Status: DC

## 2021-09-13 NOTE — Telephone Encounter (Signed)
Patient states she called to change her pharmacy but is missing one medication and needs a refill on it.   levothyroxine (SYNTHROID) 112 MCG tablet  [id number] [place], [place] - [address]

## 2021-09-13 NOTE — Telephone Encounter (Signed)
Called patient and informed her that Dr. Allena Katz can certainly send a refferal to PT if she finds a facility she would like to go to. Also informed patient that she will also need to ensure she establishes care with a new neurologist and primary care when she moves to [place]. Patient verbalized understanding and will do that. Patient verbalized understanding and had no further questions or concerns.

## 2021-09-13 NOTE — Telephone Encounter (Signed)
Erx sent

## 2021-09-13 NOTE — Telephone Encounter (Signed)
We certainly can, is there any place that she prefers?  I would recommend that she establish care with neurology and PCP so they can also assist with local resources and ongoing medical care. I wish her well.

## 2021-09-26 ENCOUNTER — Other Ambulatory Visit: Payer: Self-pay | Admitting: Adult Health

## 2021-09-26 DIAGNOSIS — F411 Generalized anxiety disorder: Secondary | ICD-10-CM

## 2021-09-26 DIAGNOSIS — F331 Major depressive disorder, recurrent, moderate: Secondary | ICD-10-CM

## 2021-10-03 ENCOUNTER — Telehealth: Payer: Self-pay | Admitting: Adult Health

## 2021-10-03 ENCOUNTER — Other Ambulatory Visit: Payer: Self-pay

## 2021-10-03 DIAGNOSIS — F331 Major depressive disorder, recurrent, moderate: Secondary | ICD-10-CM

## 2021-10-03 DIAGNOSIS — F411 Generalized anxiety disorder: Secondary | ICD-10-CM

## 2021-10-03 MED ORDER — VILAZODONE HCL 20 MG PO TABS: 20.0000 mg | ORAL_TABLET | Freq: Every day | ORAL | 0 refills | Status: DC

## 2021-10-03 NOTE — Telephone Encounter (Signed)
Pt would like refill of [facility]azodone [facility] to [facility].  She only wants one script sent because she is moving in a few weeks and not sure what pharmacy she will be using near her new residence.

## 2021-10-03 NOTE — Telephone Encounter (Signed)
RF had been sent previously to [facility]. Patient said she has retired and no longer uses [facility] [facility]. One RF sent to the requested pharmacy.

## 2021-10-03 NOTE — Telephone Encounter (Signed)
Rx sent per patient request  

## 2021-10-13 ENCOUNTER — Other Ambulatory Visit: Payer: Self-pay

## 2021-10-13 ENCOUNTER — Other Ambulatory Visit: Payer: PRIVATE HEALTH INSURANCE

## 2021-10-13 ENCOUNTER — Other Ambulatory Visit (INDEPENDENT_AMBULATORY_CARE_PROVIDER_SITE_OTHER): Payer: Medicare Other

## 2021-10-13 DIAGNOSIS — E039 Hypothyroidism, unspecified: Secondary | ICD-10-CM

## 2021-10-13 LAB — TSH: TSH: 13.79 u[IU]/mL — ABNORMAL HIGH (ref 0.35–5.50)

## 2021-10-16 ENCOUNTER — Other Ambulatory Visit: Payer: Self-pay | Admitting: Family Medicine

## 2021-10-16 DIAGNOSIS — E039 Hypothyroidism, unspecified: Secondary | ICD-10-CM

## 2021-10-16 MED ORDER — LEVOTHYROXINE SODIUM 137 MCG PO TABS: 137.0000 ug | ORAL_TABLET | Freq: Every day | ORAL | 3 refills | Status: DC

## 2021-10-17 ENCOUNTER — Other Ambulatory Visit: Payer: Self-pay | Admitting: Family Medicine

## 2021-10-17 ENCOUNTER — Other Ambulatory Visit: Payer: Self-pay | Admitting: Cardiovascular Disease

## 2021-10-17 ENCOUNTER — Other Ambulatory Visit: Payer: Self-pay | Admitting: Adult Health

## 2021-10-17 ENCOUNTER — Telehealth: Payer: Self-pay | Admitting: Family Medicine

## 2021-10-17 DIAGNOSIS — F411 Generalized anxiety disorder: Secondary | ICD-10-CM

## 2021-10-17 DIAGNOSIS — F331 Major depressive disorder, recurrent, moderate: Secondary | ICD-10-CM

## 2021-10-17 MED ORDER — BUPROPION HCL ER (SR) 150 MG PO TB12: 150.0000 mg | ORAL_TABLET | Freq: Two times a day (BID) | ORAL | 3 refills | Status: DC

## 2021-10-17 NOTE — Addendum Note (Signed)
Addended by: Sherrilee Gilles B on: 10/17/2021 04:46 PM   Modules accepted: Orders

## 2021-10-17 NOTE — Telephone Encounter (Signed)
Rx(s) sent to pharmacy electronically.  

## 2021-10-17 NOTE — Telephone Encounter (Signed)
Pt needs a refill on buPROPion (WELLBUTRIN SR) 150 MG 12 hr [facility] [place]

## 2021-10-17 NOTE — Telephone Encounter (Signed)
Rx sent 

## 2021-10-31 ENCOUNTER — Other Ambulatory Visit: Payer: Self-pay | Admitting: Adult Health

## 2021-10-31 ENCOUNTER — Other Ambulatory Visit: Payer: Self-pay

## 2021-10-31 DIAGNOSIS — F411 Generalized anxiety disorder: Secondary | ICD-10-CM

## 2021-10-31 DIAGNOSIS — F331 Major depressive disorder, recurrent, moderate: Secondary | ICD-10-CM

## 2021-10-31 DIAGNOSIS — E039 Hypothyroidism, unspecified: Secondary | ICD-10-CM

## 2021-10-31 MED ORDER — LEVOTHYROXINE SODIUM 137 MCG PO TABS: 137.0000 ug | ORAL_TABLET | Freq: Every day | ORAL | 3 refills | Status: DC

## 2021-11-02 ENCOUNTER — Other Ambulatory Visit: Payer: PRIVATE HEALTH INSURANCE

## 2021-12-02 ENCOUNTER — Ambulatory Visit: Payer: PRIVATE HEALTH INSURANCE | Admitting: Neurology

## 2021-12-12 ENCOUNTER — Telehealth: Payer: Self-pay

## 2021-12-12 DIAGNOSIS — R202 Paresthesia of skin: Secondary | ICD-10-CM

## 2021-12-12 NOTE — Telephone Encounter (Signed)
Patient has lab appointment for 5/1. She wanted to know if we can add a A1c. She has been having numbness in her feet and she has family history of DM.  ?

## 2021-12-13 NOTE — Telephone Encounter (Signed)
I added the order for A1c B12 and cmet given the paresthesias.  If she has progressive symptoms or any new weakness then let me know/seek evaluation.  Thanks. ?

## 2021-12-13 NOTE — Telephone Encounter (Signed)
Patient has been notified and advised if she has any new or worsening sx to call or seek eval. Patient verbalized understanding.  ?

## 2021-12-13 NOTE — Addendum Note (Signed)
Addended by: Joaquim Nam on: 12/13/2021 08:16 AM ? ? Modules accepted: Orders ? ?

## 2021-12-31 ENCOUNTER — Other Ambulatory Visit: Payer: Self-pay | Admitting: Neurology

## 2022-01-02 ENCOUNTER — Other Ambulatory Visit (INDEPENDENT_AMBULATORY_CARE_PROVIDER_SITE_OTHER): Payer: Medicare Other

## 2022-01-02 ENCOUNTER — Telehealth: Payer: Self-pay

## 2022-01-02 DIAGNOSIS — R202 Paresthesia of skin: Secondary | ICD-10-CM

## 2022-01-02 DIAGNOSIS — E039 Hypothyroidism, unspecified: Secondary | ICD-10-CM | POA: Diagnosis not present

## 2022-01-02 LAB — COMPREHENSIVE METABOLIC PANEL
ALT: 10 U/L (ref 0–35)
AST: 17 U/L (ref 0–37)
Albumin: 4.3 g/dL (ref 3.5–5.2)
Alkaline Phosphatase: 127 U/L — ABNORMAL HIGH (ref 39–117)
BUN: 17 mg/dL (ref 6–23)
CO2: 27 mEq/L (ref 19–32)
Calcium: 8.9 mg/dL (ref 8.4–10.5)
Chloride: 105 mEq/L (ref 96–112)
Creatinine, Ser: 0.91 mg/dL (ref 0.40–1.20)
GFR: 65.38 mL/min (ref >60.00)
Glucose, Bld: 82 mg/dL (ref 70–99)
Potassium: 4.6 mEq/L (ref 3.5–5.1)
Sodium: 140 mEq/L (ref 135–145)
Total Bilirubin: 0.5 mg/dL (ref 0.2–1.2)
Total Protein: 6.7 g/dL (ref 6.0–8.3)

## 2022-01-02 LAB — TSH: TSH: 0.65 u[IU]/mL (ref 0.35–5.50)

## 2022-01-02 LAB — HEMOGLOBIN A1C: Hgb A1c MFr Bld: 5 % (ref 4.6–6.5)

## 2022-01-02 LAB — VITAMIN B12: Vitamin B-12: >1504 pg/mL — ABNORMAL HIGH (ref 211–911)

## 2022-01-02 NOTE — Telephone Encounter (Signed)
I spoke with pt and since her CMP is scheduled at 2 PM today; pt can eat until 10 AM and then after 10 AM this morning water and black coffee; pt was appreciative and will be here today at 2pm for labs.nothing further needed at this time. ? ? ? ?[facility] - Client ?TELEPHONE ADVICE RECORD ?[facility]? ?Patient ?Name: ?Traci ?[name] ANDERSO ?[name] ?Gender: Female ?DOB: 1956-07-20 ?Age: 66 Y 3 M 14 D ?Return ?Phone ?Number: ?[id number] ?(Primary) ?Address: ?City/ ?State/ ?Zip: Traci [name] [place] ? [address] ?Client [facility] - Client ?Client Site [place] ?Provider Renford Dills - MD ?Contact Type Call ?Who Is Calling Patient / Member / Family / Caregiver ?Call Type Triage / Clinical ?Relationship To Patient Self ?Return Phone Number [phone] (Primary) ?Chief Complaint Medical Device, Procedure and Surgery Questions ?(non symptomatic) ?Reason for Call Medication Question / Request ?Initial Comment Caller states she is coming in Monday for labs, and ?she wants to know if she should be fasting before ?the test. ?Translation No ?Nurse Assessment ?Nurse: Traci Emery, RN, Manuella Ghazi Date/Time (Eastern Time): 12/30/2021 6:31:19 PM ?Confirm and document reason for call. If ?symptomatic, describe symptoms. ?---States having blood test Monday morning, for sugar ?diabetes and thyroid,and wants to know if she should ?fast ?Does the patient have any new or worsening ?symptoms? ---No ?Please document clinical information provided and ?list any resource used. ---Advised to fast for diabetes test, nursing knowledge. ?Disp. Time (Eastern ?Time) Disposition Final User ?12/30/2021 6:34:06 PM Clinical Call Yes Traci Emery, RN, [place] ?

## 2022-01-04 ENCOUNTER — Other Ambulatory Visit: Payer: Self-pay | Admitting: Family Medicine

## 2022-01-10 ENCOUNTER — Telehealth: Payer: Self-pay | Admitting: Neurology

## 2022-01-10 DIAGNOSIS — G20 Parkinson's disease: Secondary | ICD-10-CM

## 2022-01-10 NOTE — Telephone Encounter (Signed)
[place] she would like for [place] to send a referral to. It is [facility] with Dr. Clent Demark. Fax number is [phone]. ?

## 2022-01-11 NOTE — Telephone Encounter (Signed)
Referral created and will fax ?

## 2022-01-11 NOTE — Telephone Encounter (Signed)
That is fine, thanks 

## 2022-01-12 ENCOUNTER — Other Ambulatory Visit: Payer: Self-pay | Admitting: Cardiovascular Disease

## 2022-01-18 ENCOUNTER — Telehealth: Payer: Self-pay | Admitting: Neurology

## 2022-01-18 NOTE — Telephone Encounter (Signed)
I have refaxed referral.

## 2022-01-18 NOTE — Telephone Encounter (Signed)
Pt had called in previously and asked for [place] spoken [place] them and it seems they have not gotten it. Can it be sent in again? It is [facility] with Dr. Boykin Reaper. Fax number is [phone]. ?

## 2022-02-01 ENCOUNTER — Other Ambulatory Visit: Payer: Self-pay | Admitting: Family Medicine

## 2022-02-01 ENCOUNTER — Telehealth (HOSPITAL_BASED_OUTPATIENT_CLINIC_OR_DEPARTMENT_OTHER): Payer: Self-pay | Admitting: Cardiovascular Disease

## 2022-02-01 NOTE — Telephone Encounter (Signed)
*  STAT* If patient is at the pharmacy, call can be transferred to refill team.   1. Which medications need to be refilled? (please list name of each medication and dose if known)  Rosuvastatin and Carvedilol  2. Which pharmacy/location (including street and city if local pharmacy) is medication to be sent to?[facility] [place], [phone]  3. Do they need a 30 day or 90 day supply? Need enough until her appointment on 05-23-22

## 2022-02-02 MED ORDER — CARVEDILOL 12.5 MG PO TABS: 12.5000 mg | ORAL_TABLET | Freq: Two times a day (BID) | ORAL | 1 refills | Status: DC

## 2022-02-02 MED ORDER — ROSUVASTATIN CALCIUM 20 MG PO TABS: 20.0000 mg | ORAL_TABLET | Freq: Every day | ORAL | 1 refills | Status: DC

## 2022-02-02 NOTE — Telephone Encounter (Signed)
Rx(s) sent to pharmacy electronically.  

## 2022-02-07 ENCOUNTER — Telehealth (HOSPITAL_BASED_OUTPATIENT_CLINIC_OR_DEPARTMENT_OTHER): Payer: Self-pay | Admitting: Cardiovascular Disease

## 2022-02-07 NOTE — Telephone Encounter (Signed)
Routed in error, no further follow up needed

## 2022-02-07 NOTE — Telephone Encounter (Signed)
I did not need this encounter. °

## 2022-02-23 ENCOUNTER — Other Ambulatory Visit: Payer: Self-pay | Admitting: Neurology

## 2022-03-16 ENCOUNTER — Telehealth: Payer: Self-pay | Admitting: Neurology

## 2022-03-16 NOTE — Telephone Encounter (Signed)
Patient did move. She cant get an appt with a new doctor until November. She wants to stay with patel if that is possible. Stated she knows its a drive but she will do it. Her tremors are getting worse so she would like to stay with patel who is familiar. She is in a program and she is to have surgery to help with tremors. Wants to know if she is allowed to stay with patel

## 2022-03-21 ENCOUNTER — Other Ambulatory Visit: Payer: Self-pay | Admitting: Neurology

## 2022-04-12 ENCOUNTER — Encounter (INDEPENDENT_AMBULATORY_CARE_PROVIDER_SITE_OTHER): Payer: Self-pay

## 2022-04-24 ENCOUNTER — Other Ambulatory Visit: Payer: Self-pay | Admitting: Family Medicine

## 2022-04-25 ENCOUNTER — Other Ambulatory Visit: Payer: Self-pay | Admitting: Neurology

## 2022-05-21 ENCOUNTER — Other Ambulatory Visit: Payer: Self-pay | Admitting: Family Medicine

## 2022-05-21 DIAGNOSIS — M810 Age-related osteoporosis without current pathological fracture: Secondary | ICD-10-CM

## 2022-05-21 DIAGNOSIS — E039 Hypothyroidism, unspecified: Secondary | ICD-10-CM

## 2022-05-21 DIAGNOSIS — I10 Essential (primary) hypertension: Secondary | ICD-10-CM

## 2022-05-21 DIAGNOSIS — E559 Vitamin D deficiency, unspecified: Secondary | ICD-10-CM

## 2022-05-22 ENCOUNTER — Other Ambulatory Visit (INDEPENDENT_AMBULATORY_CARE_PROVIDER_SITE_OTHER): Payer: Medicare Other

## 2022-05-22 ENCOUNTER — Other Ambulatory Visit: Payer: Self-pay | Admitting: Family Medicine

## 2022-05-22 DIAGNOSIS — Z1231 Encounter for screening mammogram for malignant neoplasm of breast: Secondary | ICD-10-CM

## 2022-05-22 DIAGNOSIS — I10 Essential (primary) hypertension: Secondary | ICD-10-CM | POA: Diagnosis not present

## 2022-05-22 DIAGNOSIS — E039 Hypothyroidism, unspecified: Secondary | ICD-10-CM

## 2022-05-22 DIAGNOSIS — M810 Age-related osteoporosis without current pathological fracture: Secondary | ICD-10-CM

## 2022-05-22 DIAGNOSIS — E559 Vitamin D deficiency, unspecified: Secondary | ICD-10-CM

## 2022-05-22 LAB — COMPREHENSIVE METABOLIC PANEL
ALT: 7 U/L (ref 0–35)
AST: 16 U/L (ref 0–37)
Albumin: 4 g/dL (ref 3.5–5.2)
Alkaline Phosphatase: 121 U/L — ABNORMAL HIGH (ref 39–117)
BUN: 17 mg/dL (ref 6–23)
CO2: 24 mEq/L (ref 19–32)
Calcium: 8.6 mg/dL (ref 8.4–10.5)
Chloride: 108 mEq/L (ref 96–112)
Creatinine, Ser: 0.9 mg/dL (ref 0.40–1.20)
GFR: 66.07 mL/min (ref >60.00)
Glucose, Bld: 91 mg/dL (ref 70–99)
Potassium: 4.4 mEq/L (ref 3.5–5.1)
Sodium: 142 mEq/L (ref 135–145)
Total Bilirubin: 0.4 mg/dL (ref 0.2–1.2)
Total Protein: 6.5 g/dL (ref 6.0–8.3)

## 2022-05-22 LAB — LIPID PANEL
Cholesterol: 104 mg/dL (ref 0–200)
HDL: 34 mg/dL — ABNORMAL LOW (ref >39.00)
LDL Cholesterol: 35 mg/dL (ref 0–99)
NonHDL: 69.54
Total CHOL/HDL Ratio: 3
Triglycerides: 175 mg/dL — ABNORMAL HIGH (ref 0.0–149.0)
VLDL: 35 mg/dL (ref 0.0–40.0)

## 2022-05-22 LAB — CBC WITH DIFFERENTIAL/PLATELET
Basophils Absolute: 0.1 10*3/uL (ref 0.0–0.1)
Basophils Relative: 0.9 % (ref 0.0–3.0)
Eosinophils Absolute: 0.3 10*3/uL (ref 0.0–0.7)
Eosinophils Relative: 4.6 % (ref 0.0–5.0)
HCT: 36.6 % (ref 36.0–46.0)
Hemoglobin: 12.5 g/dL (ref 12.0–15.0)
Lymphocytes Relative: 19.8 % (ref 12.0–46.0)
Lymphs Abs: 1.2 10*3/uL (ref 0.7–4.0)
MCHC: 34.1 g/dL (ref 30.0–36.0)
MCV: 90.9 fl (ref 78.0–100.0)
Monocytes Absolute: 0.7 10*3/uL (ref 0.1–1.0)
Monocytes Relative: 11.1 % (ref 3.0–12.0)
Neutro Abs: 3.9 10*3/uL (ref 1.4–7.7)
Neutrophils Relative %: 63.6 % (ref 43.0–77.0)
Platelets: 211 10*3/uL (ref 150.0–400.0)
RBC: 4.03 Mil/uL (ref 3.87–5.11)
RDW: 14.9 % (ref 11.5–15.5)
WBC: 6.1 10*3/uL (ref 4.0–10.5)

## 2022-05-22 LAB — VITAMIN D 25 HYDROXY (VIT D DEFICIENCY, FRACTURES): VITD: 54.59 ng/mL (ref 30.00–100.00)

## 2022-05-22 LAB — VITAMIN B12: Vitamin B-12: 433 pg/mL (ref 211–911)

## 2022-05-22 LAB — TSH: TSH: 0.76 u[IU]/mL (ref 0.35–5.50)

## 2022-05-23 ENCOUNTER — Encounter (HOSPITAL_BASED_OUTPATIENT_CLINIC_OR_DEPARTMENT_OTHER): Payer: Self-pay | Admitting: Cardiovascular Disease

## 2022-05-23 ENCOUNTER — Ambulatory Visit (INDEPENDENT_AMBULATORY_CARE_PROVIDER_SITE_OTHER): Payer: Medicare Other | Admitting: Cardiovascular Disease

## 2022-05-23 VITALS — BP 132/80 | HR 76 | Ht 65.0 in | Wt 229.2 lb

## 2022-05-23 DIAGNOSIS — I2510 Atherosclerotic heart disease of native coronary artery without angina pectoris: Secondary | ICD-10-CM

## 2022-05-23 DIAGNOSIS — R600 Localized edema: Secondary | ICD-10-CM

## 2022-05-23 DIAGNOSIS — I5032 Chronic diastolic (congestive) heart failure: Secondary | ICD-10-CM | POA: Diagnosis not present

## 2022-05-23 DIAGNOSIS — I10 Essential (primary) hypertension: Secondary | ICD-10-CM | POA: Diagnosis not present

## 2022-05-23 DIAGNOSIS — E7800 Pure hypercholesterolemia, unspecified: Secondary | ICD-10-CM

## 2022-05-23 DIAGNOSIS — Z5181 Encounter for therapeutic drug level monitoring: Secondary | ICD-10-CM

## 2022-05-23 MED ORDER — FUROSEMIDE 20 MG PO TABS: 20.0000 mg | ORAL_TABLET | Freq: Every day | ORAL | 3 refills | Status: DC

## 2022-05-23 NOTE — Progress Notes (Signed)
Cardiology Office Note   Date:  05/23/2022   ID:  Jennette BankerMary Lynn [name], DOB 10-12-1955, MRN [id number]  PCP:  Joaquim Namuncan, Graham S, MD  Cardiologist:   Chilton Siiffany [place], MD   No chief complaint on file.   History of Present Illness: Traci NoseMary Lynn [name] is a 66 y.o. female with hypertension, hyperlipidemia, prior stroke, fibromyalgia, s/p gastric band, OSA and hypothyroidism who presents for follow up.  Traci [name] was admitted to the hospital 11/21/16 with three weeks of intermittent chest pain.  Cardiac enzymes were negative.  She underwent LHC 11/22/16 and had mild luminal irregularities but no obstructive disease.  LVEF was 50-55%.  LVEDP was mildly elevated at 16-20 mmHg.  BP was poorly controlled in the hospital.  She was started on carvedilol and diuresed with IV lasix.  Her echo during that hospitalization revealed LVEF 55-60% with mild LVH and grade 1 diastolic dysfunction.  Her heart catheterization was complicated by a small R groin hematoma.  She followed up with her PCP, Dr. Para Marchuncan, who wondered if she may have microvascular angina given her improvement with a beta blocker.  She continued to complain of intermittent chest pain.  Carvedilol was increased and she was started on aspirin and rosuvastatin.     She was seen in the ED 10/2020 for right-sided chest pain. Cardiac enzymes were negative and CT scan was negative for PE. Her symptoms were thought to be musculoskeletal. At the last visit she was not tolerating her CPAP. Carvedilol was increased due to poorly controlled blood pressure. She followed up with our pharmacist and her blood pressure was better controlled.   Overall she is doing well today. She reports that she is having pain in her legs at night with swelling. The ankles are the primary area of swelling and she describes it as looking like "donuts" on the end of her legs. She will take aspirin and will have to lie down to resolve her pain and swelling. There will also be some  bumps around the ankle as well. She thinks her symptoms may be caused by arthritis. When she lays down she reports feeling constricted, but denies orthopnea. She believes she has exercise induced asthma. She does experience some SOB when she exercises. Also, she has been diagnosed with arthritis that makes exercise difficult. At home she uses a stationary bike since walking is made difficult. [place] a [place] but has difficulty getting through hard packed sand. She has been trying to lose her weight. She reports that she does not use extra salt in her diet, except for boiling potatoes. Generally she is decreasing the amount of food she eats such as pizza and bread. She is overall trying to improve her diet in order to get her weight down. At home her blood pressure has been 140-142 systolic and high diastolic during previous clinic visits. She does monitor her BP at home. though she is not sure of the number. Since her last visit she has been diagnosed with Parkinson's. Once in awhile she will develop tremors in her left leg but mainly in her right arm. Occasionally she has quivering in her lips. She has not had any falls since January. However, she did almost fall the other day while carrying some books up 2 stairs. She was able to catch herself. She denies any lightheadedness prior to the fall. She denies any palpitations, chest pain, lightheadedness, headaches, syncope, or PND.   Past Medical History:  Diagnosis Date  Abnormal alkaline phosphatase test    with neg eval prev   Arthritis    CAD in native artery 07/13/2020   Chronic diastolic heart failure (HCC) 01/13/2021   Depression    Previously responded to Wellbutrin   Headache(784.0)    Hyperlipidemia    Hypothyroidism    Migraines    Osteoporosis    Fosamax start 10/2020   Stroke (HCC) 12/2000   Thyroid disease    Hypothyroidism    Past Surgical History:  Procedure Laterality Date   CHOLECYSTECTOMY     ? 20 years ago    LAPAROSCOPIC GASTRIC BANDING  2006   LEFT HEART CATH AND CORONARY ANGIOGRAPHY N/A 11/22/2016   Procedure: Left Heart Cath and Coronary Angiography;  Surgeon: Iran Ouch, MD;  Location: [facility];  Service: Cardiovascular;  Laterality: N/A;   TOTAL KNEE ARTHROPLASTY Right 11/26/2012   Procedure: TOTAL KNEE ARTHROPLASTY- right ;  Surgeon: Velna Ochs, MD;  Location: [facility] OR;  Service: Orthopedics;  Laterality: Right;     Current Outpatient Medications  Medication Sig Dispense Refill   alendronate (FOSAMAX) 70 MG tablet TAKE 1 TABLET BY MOUTH EVERY 7 DAYS WITH FULL GLASS OF WATER ON EMPTY STOMACH 12 tablet 1   aspirin EC 81 MG tablet Take 81 mg by mouth daily.     Biotin 5 MG TABS Take 5 mg by mouth.     buPROPion (WELLBUTRIN SR) 150 MG 12 hr tablet Take 1 tablet (150 mg total) by mouth 2 (two) times daily. 180 tablet 3   carbidopa-levodopa (SINEMET IR) 25-100 MG tablet TAKE 1 TABLET BY MOUTH THREE TIMES A DAY 270 tablet 0   Carbidopa-Levodopa ER (SINEMET CR) 25-100 MG tablet controlled release Take 1 tablet by mouth at bedtime. 90 tablet 3   carvedilol (COREG) 12.5 MG tablet Take 1 tablet (12.5 mg total) by mouth 2 (two) times daily with a meal. 180 tablet 1   Cholecalciferol (VITAMIN D) 50 MCG (2000 UT) tablet Take 2 tablets (4,000 Units total) by mouth daily.     cyclobenzaprine (FLEXERIL) 10 MG tablet Take 1 tablet (10 mg total) by mouth 2 (two) times daily as needed for muscle spasms. 20 tablet 0   furosemide (LASIX) 20 MG tablet Take 1 tablet (20 mg total) by mouth daily. 90 tablet 3   gabapentin (NEURONTIN) 300 MG capsule One tablet at bedtime     levothyroxine (SYNTHROID) 137 MCG tablet Take 1 tablet (137 mcg total) by mouth daily before breakfast. 90 tablet 3   lidocaine (LIDODERM) 5 % Place 1 patch onto the skin daily. Remove & Discard patch within 12 hours or as directed by MD 15 patch 0   Multiple Vitamin (MULTIVITAMIN) tablet Take 1 tablet by mouth daily.      omeprazole (PRILOSEC) 20 MG capsule TAKE 1 CAPSULE BY MOUTH EVERY DAY 90 capsule 1   rosuvastatin (CRESTOR) 20 MG tablet Take 1 tablet (20 mg total) by mouth daily. 90 tablet 1   No current facility-administered medications for this visit.    Allergies:   Lexapro [escitalopram oxalate], Meperidine hcl, Simvastatin, Rexulti [brexpiprazole], Tape, Venlafaxine, Codeine, and Tramadol    Social History:  The patient  reports that she has never smoked. She has never been exposed to tobacco smoke. She has never used smokeless tobacco. She reports that she does not drink alcohol and does not use drugs.   Family History:  The patient's family history includes Cancer in an other family member; Diabetes in  her mother; Healthy in her brother; Heart attack in her father; Heart disease in an other family member; Melanoma in her sister; Stroke in her mother.    ROS:   Please see the history of present illness. (+) chronic back pain (+) Bilateral ankle edema (+) Nocturnal bilateral LE pain (+) Arthralgias (+) shortness of breath All other systems are reviewed and negative.    PHYSICAL EXAM: VS:  BP 132/80 (BP Location: Right Arm, Patient Position: Sitting, Cuff Size: Large)   Pulse 76   Ht 5\' 5"  (1.651 m)   Wt 229 lb 3.2 oz (104 kg)   BMI 38.14 kg/m  , BMI Body mass index is 38.14 kg/m. GENERAL:  Well appearing HEENT: Pupils equal round and reactive, fundi not visualized, oral mucosa unremarkable NECK:  No jugular venous distention, waveform within normal limits, carotid upstroke brisk and symmetric, no bruits LUNGS:  Clear to auscultation bilaterally HEART:  RRR.  PMI not displaced or sustained,S1 and S2 within normal limits, no S3, no S4, no clicks, no rubs, no murmurs ABD:  Flat, positive bowel sounds normal in frequency in pitch, no bruits, no rebound, no guarding, no midline pulsatile mass, no hepatomegaly, no splenomegaly EXT:  2 plus pulses throughout, 1+ LE edema, no cyanosis no  clubbing SKIN:  No rashes no nodules NEURO:  Cranial nerves II through XII grossly intact, motor grossly intact throughout PSYCH:  Cognitively intact, oriented to person place and time  EKG:  EKG is personally reviewed.  05/23/2022: Sinus rhythm. Rate 76 bpm. Low voltage. 07/13/2020: EKG was not ordered. 01/29/2018: Sinus rhythm. Rate 78 bpm. 07/30/2017: Sinus rhythm. Rate 81 bpm. 11/21/16: sinus rhythm. Rate 91 bpm.  CT Angio Chest 11/07/2020: IMPRESSION: 1. No evidence of pulmonary emboli or thoracic aortic aneurysm. 2. Cardiomegaly. 3. Minimal bibasilar atelectasis/scarring.  LHC 11/22/16 The left ventricular systolic function is normal. LV end diastolic pressure is mildly elevated. The left ventricular ejection fraction is 50-55% by visual estimate.   1. Minor luminal irregularities with no evidence of obstructive coronary artery disease. 2. Low normal LV systolic function with an EF of 50-55%. Mildly elevated left ventricular end-diastolic pressure (LVEDP between 16-20 mmHg).  Echo 11/22/16: Study Conclusions   - Left ventricle: The cavity size was normal. Wall thickness was   increased in a pattern of mild LVH. Systolic function was normal.   The estimated ejection fraction was in the range of 55% to 60%.   Wall motion was normal; there were no regional wall motion   abnormalities. Doppler parameters are consistent with abnormal   left ventricular relaxation (grade 1 diastolic dysfunction). - Aortic valve: There was no stenosis. - Mitral valve: There was no significant regurgitation. - Left atrium: The atrium was mildly dilated. - Right ventricle: The cavity size was normal. Systolic function   was normal. - Pulmonary arteries: No complete TR doppler jet so unable to   estimate PA systolic pressure. - Inferior vena cava: The vessel was normal in size. The   respirophasic diameter changes were in the normal range (>= 50%),   consistent with normal central venous pressure.    Impressions:   - Normal LV size with mild LV hypertrophy. EF 55-60%. Normal RV   size and systolic function. No significant valvular   abnormalities.    Recent Labs: 05/22/2022: ALT 7; BUN 17; Creatinine, Ser 0.90; Hemoglobin 12.5; Platelets 211.0; Potassium 4.4; Sodium 142; TSH 0.76    Lipid Panel    Component Value Date/Time   CHOL 104  05/22/2022 0846   CHOL 133 01/13/2021 0847   TRIG 175.0 (H) 05/22/2022 0846   HDL 34.00 (L) 05/22/2022 0846   HDL 36 (L) 01/13/2021 0847   CHOLHDL 3 05/22/2022 0846   VLDL 35.0 05/22/2022 0846   LDLCALC 35 05/22/2022 0846   LDLCALC 65 01/13/2021 0847   LDLDIRECT 103.6 07/17/2011 0920      Wt Readings from Last 3 Encounters:  05/23/22 229 lb 3.2 oz (104 kg)  08/25/21 230 lb (104.3 kg)  07/26/21 233 lb (105.7 kg)      ASSESSMENT AND PLAN:  Chronic diastolic heart failure ([place]) She notes increased LE edema lately.  She has pain in her ankles and isn't sure if it is from arthritis or something else.  It has been a while since she had an echo.  We will repeat her echocardiogram to see if there is a cardiac etiology.  Recommended compression socks but she is unable to tolerate them.  We will start Lasix 20 mg daily and check a BMP in a week.  CAD in native artery Non-obstructive disease.  She doesn't have any ischemia.  Continue aspirin, carvedilol, and rosuvastatin.   Essential hypertension BP is slightly above goal.  She is going to keep working on weight loss.  We will wait till we get her echo to determine if she needs to increase her medication and if so, whether we should increase carvedilol versus adding Entresto or another agent.  Hyperlipidemia LDL is well controlled but triglycerides are slightly high.  Work on limiting carbohydrates and increasing exercise.    Current medicines are reviewed at length with the patient today.  The patient does not have concerns regarding medicines.  The following changes have been made:  none  Labs/ tests ordered today include:   Orders Placed This Encounter  Procedures   Basic metabolic panel   ECHOCARDIOGRAM COMPLETE     Disposition:   FU with Aylanie Cubillos C. Oval Linsey, MD, [facility] in 2 months with telemedicine visit.  I,Jessica Ford,acting as a Education administrator for [place], MD.,have documented all relevant documentation on the behalf of Skeet Latch, MD,as directed by  Skeet Latch, MD while in the presence of Skeet Latch, MD.   I, [place] Oval Linsey, MD have reviewed all documentation for this visit.  The documentation of the exam, diagnosis, procedures, and orders on 05/23/2022 are all accurate and complete.  Signed, Rafi Kenneth C. Oval Linsey, MD, [facility]  05/23/2022 8:32 AM    [place]

## 2022-05-23 NOTE — Assessment & Plan Note (Addendum)
She notes increased LE edema lately.  She has pain in her ankles and isn't sure if it is from arthritis or something else.  It has been a while since she had an echo.  We will repeat her echocardiogram to see if there is a cardiac etiology.  Recommended compression socks but she is unable to tolerate them.  We will start Lasix 20 mg daily and check a BMP in a week.

## 2022-05-23 NOTE — Assessment & Plan Note (Signed)
BP is slightly above goal.  She is going to keep working on weight loss.  We will wait till we get her echo to determine if she needs to increase her medication and if so, whether we should increase carvedilol versus adding Entresto or another agent.

## 2022-05-23 NOTE — Patient Instructions (Signed)
Medication Instructions:  START FUROSEMIDE 20 MG DAILY   *If you need a refill on your cardiac medications before your next appointment, please call your pharmacy*  Lab Work: BMET IN 1 WEEK   If you have labs (blood work) drawn today and your tests are completely normal, you will receive your results only by: [place] (if you have MyChart) OR A paper copy in the mail If you have any lab test that is abnormal or we need to change your treatment, we will call you to review the results.  Testing/Procedures: Your physician has requested that you have an echocardiogram. Echocardiography is a painless test that uses sound waves to create images of your heart. It provides your doctor with information about the size and shape of your heart and how well your heart's chambers and valves are working. This procedure takes approximately one hour. There are no restrictions for this procedure.   Follow-Up: At [facility], you and your health needs are our priority.  As part of our continuing mission to provide you with exceptional heart care, we have created designated Provider Care Teams.  These Care Teams include your primary Cardiologist (physician) and Advanced Practice Providers (APPs -  Physician Assistants and Nurse Practitioners) who all work together to provide you with the care you need, when you need it.  We recommend signing up for the patient portal called "MyChart".  Sign up information is provided on this After Visit Summary.  MyChart is used to connect with patients for Virtual Visits (Telemedicine).  Patients are able to view lab/test results, encounter notes, upcoming appointments, etc.  Non-urgent messages can be sent to your provider as well.   To learn more about what you can do with MyChart, go to [email or web address].    Your next appointment:   2 month(s)  The format for your next appointment:   Virtual Visit   Provider:   Skeet Latch, MD or Laurann [place], NP

## 2022-05-23 NOTE — Addendum Note (Signed)
Addended by: Alvina Filbert B on: 05/23/2022 09:26 AM   Modules accepted: Orders

## 2022-05-23 NOTE — Assessment & Plan Note (Signed)
LDL is well controlled but triglycerides are slightly high.  Work on limiting carbohydrates and increasing exercise.

## 2022-05-23 NOTE — Assessment & Plan Note (Signed)
Non-obstructive disease.  She doesn't have any ischemia.  Continue aspirin, carvedilol, and rosuvastatin.

## 2022-06-01 LAB — BASIC METABOLIC PANEL
BUN/Creatinine Ratio: 21 (ref 12–28)
BUN: 20 mg/dL (ref 8–27)
CO2: 25 mmol/L (ref 20–29)
Calcium: 9.2 mg/dL (ref 8.7–10.3)
Chloride: 103 mmol/L (ref 96–106)
Creatinine, Ser: 0.97 mg/dL (ref 0.57–1.00)
Glucose: 97 mg/dL (ref 70–99)
Potassium: 4.8 mmol/L (ref 3.5–5.2)
Sodium: 141 mmol/L (ref 134–144)
eGFR: 64 mL/min/{1.73_m2} (ref >59)

## 2022-06-05 ENCOUNTER — Ambulatory Visit (INDEPENDENT_AMBULATORY_CARE_PROVIDER_SITE_OTHER): Payer: Medicare Other | Admitting: Family Medicine

## 2022-06-05 ENCOUNTER — Encounter: Payer: Self-pay | Admitting: Family Medicine

## 2022-06-05 ENCOUNTER — Ambulatory Visit (HOSPITAL_COMMUNITY): Payer: Medicare Other | Attending: Cardiovascular Disease

## 2022-06-05 VITALS — BP 112/70 | HR 93 | Temp 97.4°F | Ht 65.0 in | Wt 227.0 lb

## 2022-06-05 DIAGNOSIS — G20A1 Parkinson's disease without dyskinesia, without mention of fluctuations: Secondary | ICD-10-CM

## 2022-06-05 DIAGNOSIS — I2510 Atherosclerotic heart disease of native coronary artery without angina pectoris: Secondary | ICD-10-CM

## 2022-06-05 DIAGNOSIS — I5032 Chronic diastolic (congestive) heart failure: Secondary | ICD-10-CM | POA: Insufficient documentation

## 2022-06-05 DIAGNOSIS — E039 Hypothyroidism, unspecified: Secondary | ICD-10-CM | POA: Diagnosis not present

## 2022-06-05 DIAGNOSIS — Z0000 Encounter for general adult medical examination without abnormal findings: Secondary | ICD-10-CM

## 2022-06-05 DIAGNOSIS — F32A Depression, unspecified: Secondary | ICD-10-CM | POA: Diagnosis not present

## 2022-06-05 DIAGNOSIS — E7800 Pure hypercholesterolemia, unspecified: Secondary | ICD-10-CM

## 2022-06-05 DIAGNOSIS — Z23 Encounter for immunization: Secondary | ICD-10-CM | POA: Diagnosis not present

## 2022-06-05 DIAGNOSIS — Z7189 Other specified counseling: Secondary | ICD-10-CM

## 2022-06-05 DIAGNOSIS — I10 Essential (primary) hypertension: Secondary | ICD-10-CM | POA: Insufficient documentation

## 2022-06-05 DIAGNOSIS — R600 Localized edema: Secondary | ICD-10-CM | POA: Diagnosis present

## 2022-06-05 DIAGNOSIS — M810 Age-related osteoporosis without current pathological fracture: Secondary | ICD-10-CM

## 2022-06-05 DIAGNOSIS — Z1211 Encounter for screening for malignant neoplasm of colon: Secondary | ICD-10-CM

## 2022-06-05 LAB — ECHOCARDIOGRAM COMPLETE
Area-P 1/2: 6.48 cm2
Height: 65 in
S' Lateral: 2.4 cm
Weight: 3632 oz

## 2022-06-05 MED ORDER — BUPROPION HCL 75 MG PO TABS: 75.0000 mg | ORAL_TABLET | Freq: Every day | ORAL | 0 refills | Status: DC

## 2022-06-05 NOTE — Patient Instructions (Addendum)
Hold crestor for 1 week and see if the aches improve.    You could change wellbutrin to 75mg  a day for a week then stop.   If you still feel well, then stay off the med.  Take care.  Glad to see you.  Flu shot today.  Covid shot in about 2 weeks.  PNA-23 about 1 month after that.

## 2022-06-05 NOTE — Progress Notes (Unsigned)
I have personally reviewed the Medicare Annual Wellness questionnaire and have noted 1. The patient's medical and social history 2. Their use of alcohol, tobacco or illicit drugs 3. Their current medications and supplements 4. The patient's functional ability including ADL's, fall risks, home safety risks and hearing or visual             impairment. 5. Diet and physical activities 6. Evidence for depression or mood disorders  The patients weight, height, BMI have been recorded in the chart and visual acuity is per eye clinic.  I have made referrals, counseling and provided education to the patient based review of the above and I have provided the pt with a written personalized care plan for preventive services.  Provider list updated- see scanned forms.  Routine anticipatory guidance given to patient.  See health maintenance. The possibility exists that previously documented standard health maintenance information may have been brought forward from a previous encounter into this note.  If needed, that same information has been updated to reflect the current situation based on today's encounter.    Flu Shingles PNA Tetanus Colon  Breast cancer screening Prostate cancer screening Advance directive Cognitive function addressed- see scanned forms- and if abnormal then additional documentation follows.   In addition to [facility], follow up visit for the below conditions:  Osteoporosis.  Compliant.  Vit D wnl. Last DXA 10/2020.    B12 wnl now.    She cut wellbutrin back to 1 tab a day.  She is off effexor.  Her mood is clearly better and "I am as happy as a lark."  Retirement clearly helped.  We talked about tapering her wellbutrin.    Elevated Cholesterol: Using medications without problems: yes Muscle aches: some leg aches, esp at night.   Diet compliance: d/w pt.   Exercise: d/w pt.    Hypothyroidism.  TSH wnl.  Compliant.   She is dealing with BLE edema, improved with  lasix.  She still has leg aches at night.    Parkinson's per neuro.  Still on carbidopa/levodopa.  Per neurology.    PMH and SH reviewed  Meds, vitals, and allergies reviewed.   ROS: Per HPI.  Unless specifically indicated otherwise in HPI, the patient denies:  General: fever. Eyes: acute vision changes ENT: sore throat Cardiovascular: chest pain Respiratory: SOB GI: vomiting GU: dysuria Musculoskeletal: acute back pain Derm: acute rash Neuro: acute motor dysfunction Psych: worsening mood Endocrine: polydipsia Heme: bleeding Allergy: hayfever  GEN: nad, alert and oriented HEENT: mucous membranes moist NECK: supple w/o LA CV: rrr. PULM: ctab, no inc wob ABD: soft, +bs EXT: no edema SKIN: no acute rash Resting R hand tremor.

## 2022-06-07 DIAGNOSIS — Z0000 Encounter for general adult medical examination without abnormal findings: Secondary | ICD-10-CM | POA: Insufficient documentation

## 2022-06-07 NOTE — Assessment & Plan Note (Signed)
Continue Crestor 

## 2022-06-07 NOTE — Assessment & Plan Note (Signed)
Flu 2023 Shingles previously done PNA discussed with patient Tetanus 2012 COVID-vaccine discussed with patient. RSV vaccine discussed with patient. Colon cancer screening discussed with patient.  She elects for Cologuard.  Pending. Breast cancer screening 2023 Bone density test deferred 2023, discussed with patient. Sister Rusty Aus designated if patient were incapacitated.  Then Gabriel Cirri Langeneggar [phone]) designated if patient were incapacitated.   Cognitive function addressed- see scanned forms- and if abnormal then additional documentation follows.

## 2022-06-07 NOTE — Assessment & Plan Note (Signed)
TSH wnl.  Compliant.  Labs discussed with patient.  Continue levothyroxine.

## 2022-06-07 NOTE — Assessment & Plan Note (Signed)
She not having chest pain.  Continue Lasix for edema.  Continue carvedilol

## 2022-06-07 NOTE — Assessment & Plan Note (Signed)
Parkinson's per neuro.  Still on carbidopa/levodopa.  Per neurology.  Her tremor has gotten worse in the meantime.

## 2022-06-07 NOTE — Assessment & Plan Note (Signed)
She cut wellbutrin back to 1 tab a day.  She is off effexor.  Her mood is clearly better and "I am as happy as a lark."  Retirement clearly helped.  We talked about tapering her wellbutrin.  See after visit summary.

## 2022-06-07 NOTE — Assessment & Plan Note (Signed)
Compliant.  Vit D wnl. Last DXA 10/2020.  Discussed deferring bone density test at this point.  Would continue Fosamax as is for now.

## 2022-06-07 NOTE — Assessment & Plan Note (Signed)
Sister Rusty Aus designated if patient were incapacitated. Then Gabriel Cirri Langeneggar [phone]) designated if patient were incapacitated.

## 2022-06-21 ENCOUNTER — Encounter: Payer: Self-pay | Admitting: Family Medicine

## 2022-06-26 ENCOUNTER — Other Ambulatory Visit: Payer: Self-pay | Admitting: Family Medicine

## 2022-07-02 ENCOUNTER — Other Ambulatory Visit: Payer: Self-pay | Admitting: Family Medicine

## 2022-07-04 LAB — COLOGUARD: COLOGUARD: NEGATIVE

## 2022-07-16 NOTE — Progress Notes (Unsigned)
Office Visit    Patient Name: Nayla Dias Date of Encounter: 07/17/2022  PCP:  Joaquim Nam, MD   [facility]  Cardiologist:  Chilton Si, MD  Advanced Practice Provider:  No care team member to display Electrophysiologist:  None      Chief Complaint    Burnell Matlin is a 66 y.o. female presents today for follow up after echocardiogram.    Past Medical History    Past Medical History:  Diagnosis Date   Abnormal alkaline phosphatase test    with neg eval prev   Arthritis    CAD in native artery 07/13/2020   Chronic diastolic heart failure (HCC) 01/13/2021   Depression    Previously responded to Wellbutrin   Headache(784.0)    Hyperlipidemia    Hypothyroidism    Migraines    Osteoporosis    Fosamax start 10/2020   Stroke (HCC) 12/2000   Thyroid disease    Hypothyroidism   Past Surgical History:  Procedure Laterality Date   CHOLECYSTECTOMY     ? 20 years ago   LAPAROSCOPIC GASTRIC BANDING  2006   LEFT HEART CATH AND CORONARY ANGIOGRAPHY N/A 11/22/2016   Procedure: Left Heart Cath and Coronary Angiography;  Surgeon: Iran Ouch, MD;  Location: [facility];  Service: Cardiovascular;  Laterality: N/A;   TOTAL KNEE ARTHROPLASTY Right 11/26/2012   Procedure: TOTAL KNEE ARTHROPLASTY- right ;  Surgeon: Velna Ochs, MD;  Location: [facility] OR;  Service: Orthopedics;  Laterality: Right;    Allergies  Allergies  Allergen Reactions   Lexapro [Escitalopram Oxalate] Other (See Comments)    Fatigue, sedation   Meperidine Hcl Other (See Comments)    Cardiac arrest and syncope   Simvastatin Other (See Comments)    REACTION: muscle aches   Rexulti [Brexpiprazole]     Tremor- possible cause   Tape     Allergic to bandaid adhesive.     Venlafaxine     Intolerant.  Joint aches.   Codeine Itching   Tramadol Itching    History of Present Illness    Markiya Keefe is a 66 y.o. female with a hx of HTN, HLD, CVA,  FM, s/p gastric band, OSA, hypothyroidism, HFpEF, nonobstructive CAD, Parkinson's last seen 05/23/22.  Admitted March 2018 with 3 weeks of intermittent chest pain.  Cardiac enzymes negative.  Left heart cath 11/22/2016 mild luminal irregularities without obstructive disease.  LVEF 50-55%.  She was started on carvedilol and diuresed with IV Lasix.  Echo during admission LVEF 55 to 60%, mild LVH, grade 1 diastolic dysfunction.  Cardiac catheterization complicated by small right groin hematoma.  Seen in the ED 10/2020 for right-sided chest pain.  Cardiac enzymes negative and CT scan negative for PE.  Symptoms thought to be musculoskeletal.  Last seen 05/23/22 noting increased LE edema. She was started on Lasix 20mg  daily and recommended for repeat echo. Echo 06/05/22 normal LVEF 60-65%, no RWMA, mild LVH, gr1DD, RV normal size/function, aortic sclerosis without stenosis.   She presents today for follow up. Her exercise is limited by arthritis but is using her stationary bike. Since Lasix her LE edema has improved. Still worse at end of day and better in morning. Reviewed venous insufficiency. Notes her legs hurt her in the evening. She takes an additional Aspirin in the evening which helps. Notes Tylenol and Aleve do not help. Describes as an "aching" and does improve with massage. Most often noticeable in her thighs.  Does get worse with swelling.   Reports no shortness of breath nor dyspnea on exertion. Reports no chest pain, pressure, or tightness. No  orthopnea, PND. Reports no palpitations.    EKGs/Labs/Other Studies Reviewed:   The following studies were reviewed today: Echo 06/05/22    1. Left ventricular ejection fraction, by estimation, is 60 to 65%. The  left ventricle has normal function. The left ventricle has no regional  wall motion abnormalities. There is mild left ventricular hypertrophy.  Left ventricular diastolic parameters  are consistent with Grade I diastolic dysfunction (impaired  relaxation).  The average left ventricular global longitudinal strain is -16.7 %. The  global longitudinal strain is normal.   2. Right ventricular systolic function is normal. The right ventricular  size is normal. There is normal pulmonary artery systolic pressure.   3. The mitral valve is normal in structure. Trivial mitral valve  regurgitation. No evidence of mitral stenosis.   4. The aortic valve is tricuspid. There is mild calcification of the  aortic valve. There is mild thickening of the aortic valve. Aortic valve  regurgitation is not visualized. Aortic valve sclerosis is present, with  no evidence of aortic valve stenosis.   5. The inferior vena cava is normal in size with greater than 50%  respiratory variability, suggesting right atrial pressure of 3 mmHg.  EKG:  EKG is not ordered today.    Recent Labs: 05/22/2022: ALT 7; Hemoglobin 12.5; Platelets 211.0; TSH 0.76 05/31/2022: BUN 20; Creatinine, Ser 0.97; Potassium 4.8; Sodium 141  Recent Lipid Panel    Component Value Date/Time   CHOL 104 05/22/2022 0846   CHOL 133 01/13/2021 0847   TRIG 175.0 (H) 05/22/2022 0846   HDL 34.00 (L) 05/22/2022 0846   HDL 36 (L) 01/13/2021 0847   CHOLHDL 3 05/22/2022 0846   VLDL 35.0 05/22/2022 0846   LDLCALC 35 05/22/2022 0846   LDLCALC 65 01/13/2021 0847   LDLDIRECT 103.6 07/17/2011 0920   Home Medications   Current Meds  Medication Sig   alendronate (FOSAMAX) 70 MG tablet TAKE 1 TABLET BY MOUTH EVERY 7 DAYS WITH FULL GLASS OF WATER ON EMPTY STOMACH   aspirin EC 81 MG tablet Take 81 mg by mouth daily.   Biotin 5 MG TABS Take 5 mg by mouth.   carbidopa-levodopa (SINEMET IR) 25-100 MG tablet TAKE 1 TABLET BY MOUTH THREE TIMES A DAY   Carbidopa-Levodopa ER (SINEMET CR) 25-100 MG tablet controlled release Take 1 tablet by mouth at bedtime.   carvedilol (COREG) 12.5 MG tablet Take 1 tablet (12.5 mg total) by mouth 2 (two) times daily with a meal.   Cholecalciferol (VITAMIN D) 50 MCG  (2000 UT) tablet Take 2 tablets (4,000 Units total) by mouth daily.   cyclobenzaprine (FLEXERIL) 10 MG tablet Take 1 tablet (10 mg total) by mouth 2 (two) times daily as needed for muscle spasms.   furosemide (LASIX) 20 MG tablet Take 1 tablet (20 mg total) by mouth daily.   gabapentin (NEURONTIN) 300 MG capsule One tablet at bedtime   levothyroxine (SYNTHROID) 137 MCG tablet Take 1 tablet (137 mcg total) by mouth daily before breakfast.   lidocaine (LIDODERM) 5 % Place 1 patch onto the skin daily. Remove & Discard patch within 12 hours or as directed by MD   Multiple Vitamin (MULTIVITAMIN) tablet Take 1 tablet by mouth daily.   omeprazole (PRILOSEC) 20 MG capsule TAKE 1 CAPSULE BY MOUTH EVERY DAY   rosuvastatin (CRESTOR) 20 MG tablet Take 1 tablet (  20 mg total) by mouth daily.     Review of Systems      All other systems reviewed and are otherwise negative except as noted above.  Physical Exam    VS:  BP 126/82   Pulse 89   Ht 5\' 5"  (1.651 m)   Wt 229 lb 14.4 oz (104.3 kg)   BMI 38.26 kg/m  , BMI Body mass index is 38.26 kg/m.  Wt Readings from Last 3 Encounters:  07/17/22 229 lb 14.4 oz (104.3 kg)  06/05/22 227 lb (103 kg)  05/23/22 229 lb 3.2 oz (104 kg)    GEN: Well nourished, well developed, in no acute distress. HEENT: normal. Neck: Supple, no JVD, carotid bruits, or masses. Cardiac: RRR, no murmurs, rubs, or gallops. No clubbing, cyanosis, edema.  Radials/PT 2+ and equal bilaterally.  Respiratory:  Respirations regular and unlabored, clear to auscultation bilaterally. GI: Soft, nontender, nondistended. MS: No deformity or atrophy. Skin: Warm and dry, no rash. Neuro:  Strength and sensation are intact. Psych: Normal affect.  Assessment & Plan    HFpEF - Euvolemic and well compensated on exam. GDMT Carvedilol, Lasix. May take additional 20mg  Lasix PRN for edema. Low sodium diet, fluid restriction <2L, and daily weights encouraged. Educated to contact our office for  weight gain of 2 lbs overnight or 5 lbs in one week.   Venous insufficiency - Contributory to LE edema. Compression stockings, elevation, low sodium diet encouraged.   CAD / HLD, LDL goal <70 - Stable with no anginal symptoms. No indication for ischemic evaluation.  GDMT aspirin, carvedilol, rosuvastatin. Heart healthy diet and regular cardiovascular exercise encouraged.    HTN - BP well controlled. Continue current antihypertensive regimen.  Discussed to monitor BP at home at least 2 hours after medications and sitting for 5-10 minutes.          Disposition: Follow up in 6 month(s) with 05/25/22, MD or APP.  Signed, , NP 07/17/2022, 9:50 AM [facility]

## 2022-07-17 ENCOUNTER — Ambulatory Visit (INDEPENDENT_AMBULATORY_CARE_PROVIDER_SITE_OTHER): Payer: Medicare Other | Admitting: Family

## 2022-07-17 ENCOUNTER — Encounter: Payer: Self-pay | Admitting: Neurology

## 2022-07-17 ENCOUNTER — Encounter (HOSPITAL_BASED_OUTPATIENT_CLINIC_OR_DEPARTMENT_OTHER): Payer: Self-pay | Admitting: Family

## 2022-07-17 ENCOUNTER — Ambulatory Visit (INDEPENDENT_AMBULATORY_CARE_PROVIDER_SITE_OTHER): Payer: Medicare Other | Admitting: Neurology

## 2022-07-17 ENCOUNTER — Ambulatory Visit
Admission: RE | Admit: 2022-07-17 | Discharge: 2022-07-17 | Disposition: A | Discharge status: Home / Self Care | Payer: Medicare Other | Source: Ambulatory Visit | Attending: Family Medicine | Admitting: Family Medicine

## 2022-07-17 VITALS — BP 132/88 | HR 85 | Ht 65.0 in | Wt 227.6 lb

## 2022-07-17 VITALS — BP 126/82 | HR 89 | Ht 65.0 in | Wt 229.9 lb

## 2022-07-17 DIAGNOSIS — G20A2 Parkinson's disease without dyskinesia, with fluctuations: Secondary | ICD-10-CM

## 2022-07-17 DIAGNOSIS — E785 Hyperlipidemia, unspecified: Secondary | ICD-10-CM

## 2022-07-17 DIAGNOSIS — I10 Essential (primary) hypertension: Secondary | ICD-10-CM | POA: Diagnosis not present

## 2022-07-17 DIAGNOSIS — I25118 Atherosclerotic heart disease of native coronary artery with other forms of angina pectoris: Secondary | ICD-10-CM | POA: Diagnosis not present

## 2022-07-17 DIAGNOSIS — I5032 Chronic diastolic (congestive) heart failure: Secondary | ICD-10-CM

## 2022-07-17 DIAGNOSIS — R600 Localized edema: Secondary | ICD-10-CM

## 2022-07-17 DIAGNOSIS — I2510 Atherosclerotic heart disease of native coronary artery without angina pectoris: Secondary | ICD-10-CM

## 2022-07-17 DIAGNOSIS — Z1231 Encounter for screening mammogram for malignant neoplasm of breast: Secondary | ICD-10-CM

## 2022-07-17 MED ORDER — CARBIDOPA-LEVODOPA ER 25-100 MG PO TBCR: 1.0000 | EXTENDED_RELEASE_TABLET | Freq: Every day | ORAL | 1 refills | Status: DC

## 2022-07-17 MED ORDER — CARBIDOPA-LEVODOPA 25-100 MG PO TABS: 1.5000 | ORAL_TABLET | Freq: Three times a day (TID) | ORAL | 1 refills | Status: DC

## 2022-07-17 NOTE — Progress Notes (Signed)
Follow-up Visit   Date: 07/17/22   Demira Gwynne MRN: [id number] DOB: September 12, 1955   Interim History: Traci [name] is a 66 y.o. left-handed Caucasian female with hypothyroidism, depression, hyperlipidemia, prior stroke, and heart failure returning to the clinic for follow-up of tremor.  The patient was accompanied to the clinic by self.  IMPRESSION/PLAN: Idiopathic Parkinson's disease manifesting with right hand tremor, hand writing change, and morning dystonia, confirmed by DAT scan (2022).  She has explored MRI guided [place] for treatment of Parkinson's disease and wants my opinion.  I informed her that being a neuromuscular specialist, my experience is very limited.  I will refer her to Movement Disease Specialist who can better guide her management locally.  [place], [place] is 1 hour from her and she would be happy to see a neurologist there.  We will make a referral to Judie Bonus. Pryor Montes, MD Movement Disorder specialist in [place], [place].  In the meantime, I will increase sinemet 25/100 to 1.5 tablet TID and continue sinemet 25/100 ER at night time.  Regarding her right arm weakness, she may benefit from EMG in the future to evaluate for entrapment neuropathy.  She is welcome to return here or discuss with her new neurologist after establishing care closer to home.  All questions answered.   ----------------------------------------------- History of present illness: In October, she started Rexulti, atypical antipsychotic, for depression.  She was on it two weeks and began having right hand tremor and leg tremor, sometimes so prominent that she would need to sit on her hand.  She had noticed reduced intensity of the tremor, such that it does not flap as hard, but now has a constant low-amplitude tremor of the hand.  She was taken off Rexulti and monitored off medication for > 6 months.  Tremor was unchanged.  In the fall of 2022, she underwent DAT scan which confirmed parkinson's  disease.  She has been taking sinemet 25/100mg  three times daily and has noticed it has helped slightly with her tremor.  Her friends have noticed that the intensity of her tremors is less.  Over the past few weeks, she has noticed new clawing of the right hand upon awakening. She has to manually use her left hand to straight it out, which it will, after sometime. She was started on sinemet ER at bedtime in December 2022.   She was previously seen here for hand writing changes and bilateral arm pain. Previously, she underwent extensive work-up including EMG, MRI brain/C-spine/L-spine, and serology testing.  EMG showed bilateral C8 radiculopathy and left L3-4 radiculopathy, which was also evidence on imaging.  Laboratory testing including inflammatory/autoimmune diseases, nutritional deficiencies, myasthenia gravis, and myopathy has been unremarkable (see below).  She also was evaluated by Dr. Nickola Major in Rheumatology whose work-up was negative.  She completed neck physiotherapy, without any improvement.   UPDATE 07/17/2022:  She moved to [place], [place] which is about 4 hours away, however, reports having difficulty establishing care with neurology.  She is here for follow-up.  She was last seen in December 2022 and has been tolerating sinemet 25/100 1 tablet TID well.   Over the past two weeks, she has noticed that her tremors are worse.  She has research management options and saw Dr. Doree Fudge, [facility] who offers MRI Focused ultrasound.  She is interested in this and wants my opinion.   She is discouraged by the diagnosis of Parkinson's and says the the tremor is the worst part of her  health.  She also complains of right hand weakness and reports dropping objects occasionally.  No numbness/tingling or radicular arm pain. However, right arm tends to be sore occasionally.   Medications:  Current Outpatient Medications on File Prior to Visit  Medication Sig Dispense Refill   alendronate  (FOSAMAX) 70 MG tablet TAKE 1 TABLET BY MOUTH EVERY 7 DAYS WITH FULL GLASS OF WATER ON EMPTY STOMACH 12 tablet 1   aspirin EC 81 MG tablet Take 81 mg by mouth daily.     Biotin 5 MG TABS Take 5 mg by mouth.     carbidopa-levodopa (SINEMET IR) 25-100 MG tablet TAKE 1 TABLET BY MOUTH THREE TIMES A DAY 270 tablet 0   Carbidopa-Levodopa ER (SINEMET CR) 25-100 MG tablet controlled release Take 1 tablet by mouth at bedtime. 90 tablet 3   carvedilol (COREG) 12.5 MG tablet Take 1 tablet (12.5 mg total) by mouth 2 (two) times daily with a meal. 180 tablet 1   Cholecalciferol (VITAMIN D) 50 MCG (2000 UT) tablet Take 2 tablets (4,000 Units total) by mouth daily.     cyclobenzaprine (FLEXERIL) 10 MG tablet Take 1 tablet (10 mg total) by mouth 2 (two) times daily as needed for muscle spasms. 20 tablet 0   furosemide (LASIX) 20 MG tablet Take 1 tablet (20 mg total) by mouth daily. 90 tablet 3   gabapentin (NEURONTIN) 300 MG capsule One tablet at bedtime-prn     levothyroxine (SYNTHROID) 137 MCG tablet Take 1 tablet (137 mcg total) by mouth daily before breakfast. 90 tablet 3   lidocaine (LIDODERM) 5 % Place 1 patch onto the skin daily. Remove & Discard patch within 12 hours or as directed by MD 15 patch 0   Multiple Vitamin (MULTIVITAMIN) tablet Take 1 tablet by mouth daily.     omeprazole (PRILOSEC) 20 MG capsule TAKE 1 CAPSULE BY MOUTH EVERY DAY 90 capsule 1   rosuvastatin (CRESTOR) 20 MG tablet Take 1 tablet (20 mg total) by mouth daily. 90 tablet 1   No current facility-administered medications on file prior to visit.    Allergies:  Allergies  Allergen Reactions   Lexapro [Escitalopram Oxalate] Other (See Comments)    Fatigue, sedation   Meperidine Hcl Other (See Comments)    Cardiac arrest and syncope   Simvastatin Other (See Comments)    REACTION: muscle aches   Rexulti [Brexpiprazole]     Tremor- possible cause   Tape     Allergic to bandaid adhesive.     Venlafaxine     Intolerant.  Joint  aches.   Codeine Itching   Tramadol Itching    Vital Signs:  BP 132/88 (BP Location: Left Arm, Patient Position: Sitting, Cuff Size: Normal)   Pulse 85   Ht 5\' 5"  (1.651 m)   Wt 227 lb 9.6 oz (103.2 kg)   SpO2 96%   BMI 37.87 kg/m   Neurological Exam: MENTAL STATUS including orientation to time, place, person, recent and remote memory, attention span and concentration, language, and fund of knowledge is normal.  Speech is not dysarthric.  CRANIAL NERVES:  No visual field defects.  Pupils equal round and reactive to light.  Normal conjugate, extra-ocular eye movements in all directions of gaze.  No ptosis   MOTOR:  Motor strength is 5/5 in all extremities. Low amplitude, high frequency resting hand and arm tremor right >> left.  No tremor of the legs is observed.  Dissipates with action.   No pronator drift.  Tone  is normal.    MSRs:  Reflexes are 2+/4 throughout.  SENSORY:  Intact to vibration throughout.  COORDINATION/GAIT:  Normal finger-to- nose-finger.  There is mild decrement of amplitude and speed with finger tapping on the right only. Gait narrow based and stable. Right hand tremor is more noticeable when walking.   Data: n/a  Total time spent reviewing records, interview, history/exam, documentation, and coordination of care on day of encounter:  30 min    Thank you for allowing me to participate in patient's care.  If I can answer any additional questions, I would be pleased to do so.    Sincerely,    Garima Chronis K. Allena Katz, DO

## 2022-07-17 NOTE — Patient Instructions (Addendum)
We will refer you to Judie Bonus. Pryor Montes, MD Movement Disorder specialist in [place], [place].  Increase sinemet IR to 1.5 tablets three times daily

## 2022-07-17 NOTE — Patient Instructions (Addendum)
Medication Instructions:  Your Physician recommend you continue on your current medication as directed.    You may take an extra tablet of lasix for weight gain of 2 pounds overnight or 5 pounds in one week or for swelling, if you are taking this more than once per week please call the office   *If you need a refill on your cardiac medications before your next appointment, please call your pharmacy*  Follow-Up: At [facility], you and your health needs are our priority.  As part of our continuing mission to provide you with exceptional heart care, we have created designated Provider Care Teams.  These Care Teams include your primary Cardiologist (physician) and Advanced Practice Providers (APPs -  Physician Assistants and Nurse Practitioners) who all work together to provide you with the care you need, when you need it.  We recommend signing up for the patient portal called "MyChart".  Sign up information is provided on this After Visit Summary.  MyChart is used to connect with patients for Virtual Visits (Telemedicine).  Patients are able to view lab/test results, encounter notes, upcoming appointments, etc.  Non-urgent messages can be sent to your provider as well.   To learn more about what you can do with MyChart, go to [email or web address].    Your next appointment:   6 month(s)  The format for your next appointment:   In Person  Provider:   Chilton Si, MD or [name], NP    Other Instructions  Important Information About Sugar To prevent or reduce lower extremity swelling: Eat a low salt diet. Salt makes the body hold onto extra fluid which causes swelling. Sit with legs elevated. For example, in the recliner or on an ottoman.  Wear knee-high compression stockings during the daytime. Ones labeled 15-20 mmHg provide good compression.  Chronic Venous Insufficiency Chronic venous insufficiency is a condition where the leg veins cannot effectively pump blood  from the legs to the heart. This happens when the vein walls are either stretched, weakened, or damaged, or when the valves inside the vein are damaged. With the right treatment, you should be able to continue with an active life. This condition is also called venous stasis. What are the causes? Common causes of this condition include: High blood pressure inside the veins (venous hypertension). Sitting or standing too long, causing increased blood pressure in the leg veins. A blood clot that blocks blood flow in a vein (deep vein thrombosis, DVT). Inflammation of a vein (phlebitis) that causes a blood clot to form. Tumors in the pelvis that cause blood to back up. What increases the risk? The following factors may make you more likely to develop this condition: Having a family history of this condition. Obesity. Pregnancy. Living without enough regular physical activity or exercise (sedentary lifestyle). Smoking. Having a job that requires long periods of standing or sitting in one place. Being a certain age. Women in their 61s and 68s and men in their 69s are more likely to develop this condition. What are the signs or symptoms? Symptoms of this condition include: Veins that are enlarged, bulging, or twisted (varicose veins). Skin breakdown or ulcers. Reddened skin or dark discoloration of skin on the leg between the knee and ankle. Brown, smooth, tight, and painful skin just above the ankle, usually on the inside of the leg (lipodermatosclerosis). Swelling of the legs. How is this diagnosed? This condition may be diagnosed based on: Your medical history. A physical exam. Tests, such as: A  procedure that creates an image of a blood vessel and nearby organs and provides information about blood flow through the blood vessel (duplex ultrasound). A procedure that tests blood flow (plethysmography). A procedure that looks at the veins using X-ray and dye (venogram). How is this  treated? The goals of treatment are to help you return to an active life and to minimize pain or disability. Treatment depends on the severity of your condition, and it may include: Wearing compression stockings. These can help relieve symptoms and help prevent your condition from getting worse. However, they do not cure the condition. Sclerotherapy. This procedure involves an injection of a solution that shrinks damaged veins. Surgery. This may involve: Removing a diseased vein (vein stripping). Cutting off blood flow through the vein (laser ablation surgery). Repairing or reconstructing a valve within the affected vein. Follow these instructions at home:     Wear compression stockings as told by your health care provider. These stockings help to prevent blood clots and reduce swelling in your legs. Take over-the-counter and prescription medicines only as told by your health care provider. Stay active by exercising, walking, or doing different activities. Ask your health care provider what activities are safe for you and how much exercise you need. Drink enough fluid to keep your urine pale yellow. Do not use any products that contain nicotine or tobacco, such as cigarettes, e-cigarettes, and chewing tobacco. If you need help quitting, ask your health care provider. Keep all follow-up visits as told by your health care provider. This is important. Contact a health care provider if you: Have redness, swelling, or more pain in the affected area. See a red streak or line that goes up or down from the affected area. Have skin breakdown or skin loss in the affected area, even if the breakdown is small. Get an injury in the affected area. Get help right away if: You get an injury and an open wound in the affected area. You have: Severe pain that does not get better with medicine. Sudden numbness or weakness in the foot or ankle below the affected area. Trouble moving your foot or ankle. A  fever. Worse or persistent symptoms. Chest pain. Shortness of breath. Summary Chronic venous insufficiency is a condition where the leg veins cannot effectively pump blood from the legs to the heart. Chronic venous insufficiency occurs when the vein walls become stretched, weakened, or damaged, or when valves within the vein are damaged. Treatment depends on how severe your condition is. It often involves wearing compression stockings and may involve having a procedure. Make sure you stay active by exercising, walking, or doing different activities. Ask your health care provider what activities are safe for you and how much exercise you need. This information is not intended to replace advice given to you by your health care provider. Make sure you discuss any questions you have with your health care provider. Document Revised: 11/02/2020 Document Reviewed: 11/02/2020 Elsevier Patient Education  2023 [employer].

## 2022-07-26 ENCOUNTER — Other Ambulatory Visit: Payer: Self-pay | Admitting: Family Medicine

## 2022-07-26 ENCOUNTER — Other Ambulatory Visit (HOSPITAL_BASED_OUTPATIENT_CLINIC_OR_DEPARTMENT_OTHER): Payer: Self-pay | Admitting: Cardiovascular Disease

## 2022-07-26 NOTE — Telephone Encounter (Signed)
Rx request sent to pharmacy.  

## 2022-10-16 ENCOUNTER — Other Ambulatory Visit: Payer: Self-pay | Admitting: Family Medicine

## 2022-10-22 ENCOUNTER — Other Ambulatory Visit (HOSPITAL_BASED_OUTPATIENT_CLINIC_OR_DEPARTMENT_OTHER): Payer: Self-pay | Admitting: Cardiovascular Disease

## 2022-10-22 ENCOUNTER — Other Ambulatory Visit: Payer: Self-pay | Admitting: Family Medicine

## 2022-10-22 DIAGNOSIS — E039 Hypothyroidism, unspecified: Secondary | ICD-10-CM

## 2022-10-23 NOTE — Telephone Encounter (Signed)
Rx(s) sent to pharmacy electronically.  

## 2022-11-24 DIAGNOSIS — Z961 Presence of intraocular lens: Secondary | ICD-10-CM | POA: Insufficient documentation

## 2022-12-03 ENCOUNTER — Other Ambulatory Visit: Payer: Self-pay | Admitting: Neurology

## 2022-12-03 ENCOUNTER — Other Ambulatory Visit (HOSPITAL_BASED_OUTPATIENT_CLINIC_OR_DEPARTMENT_OTHER): Payer: Self-pay | Admitting: Cardiovascular Disease

## 2022-12-04 NOTE — Telephone Encounter (Signed)
Rx(s) sent to pharmacy electronically.  

## 2022-12-05 NOTE — Telephone Encounter (Signed)
Called patient and informed her that [name] had sent in her rx and will fill for 6 months. Patient stated that she has a new doctor and he has began filling her prescriptions for her. Patient states she went to [facility] and got her prescriptions all sorted out and her new neurologist will be filling them. Patient thanked [name] for her help.

## 2023-01-02 ENCOUNTER — Other Ambulatory Visit: Payer: Self-pay | Admitting: Family Medicine

## 2023-01-16 ENCOUNTER — Ambulatory Visit (HOSPITAL_BASED_OUTPATIENT_CLINIC_OR_DEPARTMENT_OTHER): Payer: Medicare Other | Admitting: Cardiovascular Disease

## 2023-01-16 ENCOUNTER — Encounter (HOSPITAL_BASED_OUTPATIENT_CLINIC_OR_DEPARTMENT_OTHER): Payer: Self-pay | Admitting: Cardiovascular Disease

## 2023-01-16 VITALS — BP 138/84 | HR 79 | Ht 65.0 in | Wt 234.5 lb

## 2023-01-16 DIAGNOSIS — R0683 Snoring: Secondary | ICD-10-CM

## 2023-01-16 DIAGNOSIS — I5032 Chronic diastolic (congestive) heart failure: Secondary | ICD-10-CM | POA: Diagnosis not present

## 2023-01-16 DIAGNOSIS — E7800 Pure hypercholesterolemia, unspecified: Secondary | ICD-10-CM

## 2023-01-16 DIAGNOSIS — R400 Somnolence: Secondary | ICD-10-CM

## 2023-01-16 DIAGNOSIS — I10 Essential (primary) hypertension: Secondary | ICD-10-CM | POA: Diagnosis not present

## 2023-01-16 MED ORDER — FUROSEMIDE 40 MG PO TABS: 40.0000 mg | ORAL_TABLET | Freq: Every day | ORAL | 3 refills | Status: DC

## 2023-01-16 MED ORDER — LOSARTAN POTASSIUM 25 MG PO TABS: 25.0000 mg | ORAL_TABLET | Freq: Every day | ORAL | 3 refills | Status: DC

## 2023-01-16 NOTE — Progress Notes (Signed)
Cardiology Office Note  Date:  01/16/2023   ID:  Traci, [name] [birth year], MRN [id number]  PCP:  Joaquim Nam, MD  Cardiologist:   Chilton Si, MD   No chief complaint on file.   History of Present Illness: Traci [name] is a 67 y.o. female with hypertension, hyperlipidemia, prior stroke, fibromyalgia, s/p gastric band, OSA and hypothyroidism who presents for follow up.  Traci [name] was admitted to the hospital 11/21/16 with three weeks of intermittent chest pain.  Cardiac enzymes were negative.  She underwent LHC 11/22/16 and had mild luminal irregularities but no obstructive disease.  LVEF was 50-55%.  LVEDP was mildly elevated at 16-20 mmHg.  BP was poorly controlled in the hospital.  She was started on carvedilol and diuresed with IV lasix.  Her echo during that hospitalization revealed LVEF 55-60% with mild LVH and grade 1 diastolic dysfunction.  Her heart catheterization was complicated by a small R groin hematoma.  She followed up with her PCP, Dr. Para March, who wondered if she may have microvascular angina given her improvement with a beta blocker.  She continued to complain of intermittent chest pain.  Carvedilol was increased and she was started on aspirin and rosuvastatin.    She was seen in the ED 10/2020 for right-sided chest pain. Cardiac enzymes were negative and CT scan was negative for PE. Her symptoms were thought to be musculoskeletal. At the last visit she was not tolerating her CPAP. Carvedilol was increased due to poorly controlled blood pressure. She followed up with our pharmacist and her blood pressure was better controlled.  At her visit 05/2022 she was doing well but did have some lower extremity edema.  She had an echo 06/2022 that revealed LVEF 60 to 65% with mild LVH and grade 1 diastolic dysfunction.  She was started on Lasix.  She followed up with [name], NP the following month and noted that her edema was better but not completely  resolved.  She was instructed to take an extra 20 mg of Lasix as needed and to wear compression stockings. Overall she is doing well today. She reports that she is having pain in her legs at night with swelling. The ankles are the primary area of swelling and she describes it as looking like "donuts" on the end of her legs. She will take aspirin and will have to lie down to resolve her pain and swelling. There will also be some bumps around the ankle as well. She thinks her symptoms may be caused by arthritis. When she lays down she reports feeling constricted, but denies orthopnea. She believes she has exercise induced asthma. She does experience some SOB when she exercises. Also, she has been diagnosed with arthritis that makes exercise difficult. At home she uses a stationary bike since walking is made difficult. [place] beach a lot but has difficulty getting through hard packed sand. She has been trying to lose her weight. She reports that she does not use extra salt in her diet, except for boiling potatoes. Generally she is decreasing the amount of food she eats such as pizza and bread. She is overall trying to improve her diet in order to get her weight down. At home her blood pressure has been 140-142 systolic and high diastolic during previous clinic visits. She does monitor her BP at home. though she is not sure of the number. Since her last visit she has been diagnosed with Parkinson's. Once in awhile  she will develop tremors in her left leg but mainly in her right arm. Occasionally she has quivering in her lips. She has not had any falls since January. However, she did almost fall the other day while carrying some books up 2 stairs. She was able to catch herself. She denies any lightheadedness prior to the fall. She denies any palpitations, chest pain, lightheadedness, headaches, syncope, or PND.  Traci [name] reports feeling generally unwell. She mentions that her legs and ankles are  swelling, particularly at night, causing discomfort and pain. The swelling is persistent and has now begun to affect her feet and toes as well.  She confirms using a home blood pressure cuff, with readings generally in the range of 120s/80s to 130s/90s. She is currently on Lasix, which initially improved her swelling, but she feels the symptoms are not fully controlled. She mentions that elevating her head and feet while lying down provides some relief.  Regarding her respiratory symptoms, Traci [name] states that her breathing is generally okay, but friends have observed that she breathes heavily. She experiences difficulty breathing and a choking sensation when lying on her back, which forces her to sleep on her side.  Traci [name] also discusses her excessive sleepiness, noting that she sleeps a lot and can easily fall asleep during the day. Despite long hours of sleep, she still feels tired upon waking. She has not had a sleep study done previously.  Traci [name] denies experiencing any chest pain or pressure. She acknowledges a history of high blood pressure and is informed about her heart's condition showing signs of stiffness and diastolic dysfunction. She is currently prescribed Lasix and is advised to adjust the dosage.   Past Medical History:  Diagnosis Date   Abnormal alkaline phosphatase test    with neg eval prev   Arthritis    CAD in native artery 07/13/2020   Chronic diastolic heart failure (HCC) 01/13/2021   Depression    Previously responded to Wellbutrin   Headache(784.0)    Hyperlipidemia    Hypothyroidism    Migraines    Osteoporosis    Fosamax start 10/2020   Stroke (HCC) 12/2000   Thyroid disease    Hypothyroidism    Past Surgical History:  Procedure Laterality Date   CATARACT EXTRACTION Bilateral    CHOLECYSTECTOMY     ? 20 years ago   LAPAROSCOPIC GASTRIC BANDING  09/04/2004   LEFT HEART CATH AND CORONARY ANGIOGRAPHY N/A 11/22/2016   Procedure: Left Heart Cath  and Coronary Angiography;  Surgeon: Iran Ouch, MD;  Location: [facility];  Service: Cardiovascular;  Laterality: N/A;   TOTAL KNEE ARTHROPLASTY Right 11/26/2012   Procedure: TOTAL KNEE ARTHROPLASTY- right ;  Surgeon: Velna Ochs, MD;  Location: [facility] OR;  Service: Orthopedics;  Laterality: Right;     Current Outpatient Medications  Medication Sig Dispense Refill   alendronate (FOSAMAX) 70 MG tablet TAKE 1 TABLET BY MOUTH EVERY 7 DAYS WITH FULL GLASS OF WATER ON EMPTY STOMACH 12 tablet 1   aspirin EC 81 MG tablet Take 81 mg by mouth daily.     Biotin 5 MG TABS Take 5 mg by mouth.     carbidopa-levodopa (SINEMET IR) 25-100 MG tablet Take 1.5 tablets by mouth 3 (three) times daily. 405 tablet 1   Carbidopa-Levodopa ER (SINEMET CR) 25-100 MG tablet controlled release TAKE 1 TABLET BY MOUTH EVERYDAY AT BEDTIME 90 tablet 1   carvedilol (COREG) 12.5 MG tablet TAKE 1 TABLET (12.5MG   TOTAL) BY MOUTH TWICE A DAY WITH MEALS 180 tablet 2   Cholecalciferol (VITAMIN D) 50 MCG (2000 UT) tablet Take 2 tablets (4,000 Units total) by mouth daily.     cyclobenzaprine (FLEXERIL) 10 MG tablet Take 1 tablet (10 mg total) by mouth 2 (two) times daily as needed for muscle spasms. 20 tablet 0   diclofenac (VOLTAREN) 75 MG EC tablet Take 75 mg by mouth 2 (two) times daily.     levothyroxine (SYNTHROID) 137 MCG tablet TAKE 1 TABLET BY MOUTH DAILY BEFORE BREAKFAST. 90 tablet 1   lidocaine (LIDODERM) 5 % Place 1 patch onto the skin daily. Remove & Discard patch within 12 hours or as directed by MD 15 patch 0   losartan (COZAAR) 25 MG tablet Take 1 tablet (25 mg total) by mouth daily. 90 tablet 3   Multiple Vitamin (MULTIVITAMIN) tablet Take 1 tablet by mouth daily.     omeprazole (PRILOSEC) 20 MG capsule TAKE 1 CAPSULE BY MOUTH EVERY DAY 90 capsule 1   rosuvastatin (CRESTOR) 20 MG tablet TAKE 1 TABLET BY MOUTH EVERY DAY 90 tablet 2   furosemide (LASIX) 40 MG tablet Take 1 tablet (40 mg total) by mouth  daily. 90 tablet 3   No current facility-administered medications for this visit.    Allergies:   Lexapro [escitalopram oxalate], Meperidine hcl, Simvastatin, Rexulti [brexpiprazole], Tape, Venlafaxine, Codeine, and Tramadol    Social History:  The patient  reports that she has never smoked. She has never been exposed to tobacco smoke. She has never used smokeless tobacco. She reports that she does not drink alcohol and does not use drugs.   Family History:  The patient's family history includes Cancer in an other family member; Diabetes in her mother; Healthy in her brother; Heart attack in her father; Heart disease in an other family member; Melanoma in her sister; Stroke in her mother.    ROS:   Please see the history of present illness. (+) chronic back pain (+) Bilateral ankle edema (+) Nocturnal bilateral LE pain (+) Arthralgias (+) shortness of breath All other systems are reviewed and negative.    PHYSICAL EXAM: VS:  BP 138/84   Pulse 79   Ht 5\' 5"  (1.651 m)   Wt 234 lb 8 oz (106.4 kg)   SpO2 95%   BMI 39.02 kg/m  , BMI Body mass index is 39.02 kg/m. GENERAL:  Well appearing HEENT: Pupils equal round and reactive, fundi not visualized, oral mucosa unremarkable NECK:  No jugular venous distention, waveform within normal limits, carotid upstroke brisk and symmetric, no bruits LUNGS:  Clear to auscultation bilaterally HEART:  RRR.  PMI not displaced or sustained,S1 and S2 within normal limits, no S3, no S4, no clicks, no rubs, no murmurs ABD:  Flat, positive bowel sounds normal in frequency in pitch, no bruits, no rebound, no guarding, no midline pulsatile mass, no hepatomegaly, no splenomegaly EXT:  2 plus pulses throughout, 1+ LE edema, no cyanosis no clubbing SKIN:  No rashes no nodules NEURO:  Cranial nerves II through XII grossly intact, motor grossly intact throughout PSYCH:  Cognitively intact, oriented to person place and time  EKG:  EKG is personally reviewed.   05/23/2022: Sinus rhythm. Rate 76 bpm. Low voltage. 07/13/2020: EKG was not ordered. 01/29/2018: Sinus rhythm. Rate 78 bpm. 07/30/2017: Sinus rhythm. Rate 81 bpm. 11/21/16: sinus rhythm. Rate 91 bpm.  CT Angio Chest 11/07/2020: IMPRESSION: 1. No evidence of pulmonary emboli or thoracic aortic aneurysm. 2. Cardiomegaly. 3.  Minimal bibasilar atelectasis/scarring.  LHC 11/22/16 The left ventricular systolic function is normal. LV end diastolic pressure is mildly elevated. The left ventricular ejection fraction is 50-55% by visual estimate.   1. Minor luminal irregularities with no evidence of obstructive coronary artery disease. 2. Low normal LV systolic function with an EF of 50-55%. Mildly elevated left ventricular end-diastolic pressure (LVEDP between 16-20 mmHg).  Echo 11/22/16: Study Conclusions   - Left ventricle: The cavity size was normal. Wall thickness was   increased in a pattern of mild LVH. Systolic function was normal.   The estimated ejection fraction was in the range of 55% to 60%.   Wall motion was normal; there were no regional wall motion   abnormalities. Doppler parameters are consistent with abnormal   left ventricular relaxation (grade 1 diastolic dysfunction). - Aortic valve: There was no stenosis. - Mitral valve: There was no significant regurgitation. - Left atrium: The atrium was mildly dilated. - Right ventricle: The cavity size was normal. Systolic function   was normal. - Pulmonary arteries: No complete TR doppler jet so unable to   estimate PA systolic pressure. - Inferior vena cava: The vessel was normal in size. The   respirophasic diameter changes were in the normal range (>= 50%),   consistent with normal central venous pressure.   Impressions:   - Normal LV size with mild LV hypertrophy. EF 55-60%. Normal RV   size and systolic function. No significant valvular   abnormalities.    Recent Labs: 05/22/2022: ALT 7; Hemoglobin 12.5; Platelets  211.0; TSH 0.76 05/31/2022: BUN 20; Creatinine, Ser 0.97; Potassium 4.8; Sodium 141    Lipid Panel    Component Value Date/Time   CHOL 104 05/22/2022 0846   CHOL 133 01/13/2021 0847   TRIG 175.0 (H) 05/22/2022 0846   HDL 34.00 (L) 05/22/2022 0846   HDL 36 (L) 01/13/2021 0847   CHOLHDL 3 05/22/2022 0846   VLDL 35.0 05/22/2022 0846   LDLCALC 35 05/22/2022 0846   LDLCALC 65 01/13/2021 0847   LDLDIRECT 103.6 07/17/2011 0920      Wt Readings from Last 3 Encounters:  01/16/23 234 lb 8 oz (106.4 kg)  07/17/22 227 lb 9.6 oz (103.2 kg)  07/17/22 229 lb 14.4 oz (104.3 kg)      ASSESSMENT AND PLAN:  # HFpEF:  # Lower extremity edema: - Increase Lasix from 20 mg to 40 mg daily, taken together in the morning.  Check BMP in 1 week - Continue elevating legs in bed - Monitor for improvement in swelling and pain  # Fatigue and excessive daytime sleepiness: - Evaluate for sleep apnea with a home sleep study - Monitor for improvement in daytime sleepiness and energy levels  # Hypertension: - Initiate Losartan 25 mg daily.  Continue carvedilol - Continue monitoring blood pressure at home and bring to follow up  # CAD:  # HL: Stable without ischemia.  Continue aspirin, carvedilol and rosuvastatin.  Current medicines are reviewed at length with the patient today.  The patient does not have concerns regarding medicines.  The following changes have been made: none  Labs/ tests ordered today include:   Orders Placed This Encounter  Procedures   Basic metabolic panel   Itamar Sleep Study     Disposition:   FU with Ravonda Brecheen C. Duke Salvia, MD, [facility] in 6 months.  APP in 1-2 months   Signed, Miu Chiong C. Duke Salvia, MD, [facility]  01/16/2023 9:49 AM    [facility]

## 2023-01-16 NOTE — Patient Instructions (Signed)
Medication Instructions:  Your physician has recommended you make the following change in your medication:   Start: Losartan 25mg  daily   Change: Lasix 40mg  daily  *If you need a refill on your cardiac medications before your next appointment, please call your pharmacy*   Lab Work: Please return for Lab work in one week for BMP. You may come to the...   [facility] (3rd floor) [address] [place], [place] [address]  Open: 8am-Noon and 1pm-4:30pm  Please ring the doorbell on the small table when you exit the elevator and the Lab Tech will come get you  [facility] at [facility] [address], [place], [place] [address] Open: 8am-1pm, then 2pm-4:30pm   [facility]- Please see attached locations sheet stapled to your lab work with address and hours.   If you have labs (blood work) drawn today and your tests are completely normal, you will receive your results only by: MyChart Message (if you have MyChart) OR A paper copy in the mail If you have any lab test that is abnormal or we need to change your treatment, we will call you to review the results.   Testing/Procedures: WatchPAT?  Is a FDA cleared portable home sleep study test that uses a watch and 3 points of contact to monitor 7 different channels, including your heart rate, oxygen saturations, body position, snoring, and chest motion.  The study is easy to use from the comfort of your own home and accurately detect sleep apnea.  Before bed, you attach the chest sensor, attached the sleep apnea bracelet to your nondominant hand, and attach the finger probe.  After the study, the raw data is downloaded from the watch and scored for apnea events.   For more information: [email or web address]  Patient Testing Instructions:  Do not put battery into the device until bedtime when you are ready to begin the test. Please call the support number if you need assistance  after following the instructions below: 24 hour support line- [phone] or ITAMAR support at [phone] (option 2)  Download the IntelWatchPAT One" app through the google play store or App Store  Be sure to turn on or enable access to bluetooth in settlings on your smartphone/ device  Make sure no other bluetooth devices are on and within the vicinity of your smartphone/ device and WatchPAT watch during testing.  Make sure to leave your smart phone/ device plugged in and charging all night.  When ready for bed:  Follow the instructions step by step in the WatchPAT One App to activate the testing device. For additional instructions, including video instruction, visit the WatchPAT One video on Youtube. You can search for WatchPat One within Youtube (video is 4 minutes and 18 seconds) or enter: https://youtube/watch?[email or web address]_vbiwxE Please note: You will be prompted to enter a Pin to connect via bluetooth when starting the test. The PIN will be assigned to you when you receive the test.  The device is disposable, but it recommended that you retain the device until you receive a call letting you know the study has been received and the results have been interpreted.  We will let you know if the study did not transmit to [place] properly after the test is completed. You do not need to call us to confirm the receipt of the test.  Please complete the test within 48 hours of receiving PIN.   Frequently Asked Questions:  What is Watch Dennie Bible one?  A single use fully  disposable home sleep apnea testing device and will not need to be returned after completion.  What are the requirements to use WatchPAT one?  The be able to have a successful watchpat one sleep study, you should have your Watch pat one device, your smart phone, watch pat one app, your PIN number and Internet access What type of phone do I need?  You should have a smart phone that uses Android 5.1 and above or any Iphone with IOS 10 and above How  can I download the WatchPAT one app?  Based on your device type search for WatchPAT one app either in google play for android devices or APP store for Iphone's Where will I get my PIN for the study?  Your PIN will be provided by your physician's office. It is used for authentication and if you lose/forget your PIN, please reach out to your providers office.  I do not have Internet at home. Can I do WatchPAT one study?  WatchPAT One needs Internet connection throughout the night to be able to transmit the sleep data. You can use your home/local internet or your cellular's data package. However, it is always recommended to use home/local Internet. It is estimated that between 20MB-30MB will be used with each study.However, the application will be looking for space in the phone to start the study.  What happens if I lose internet or bluetooth connection?  During the internet disconnection, your phone will not be able to transmit the sleep data. All the data, will be stored in your phone. As soon as the internet connection is back on, the phone will being sending the sleep data. During the bluetooth disconnection, WatchPAT one will not be able to to send the sleep data to your phone. Data will be kept in the [facility] one until two devices have bluetooth connection back on. As soon as the connection is back on, WatchPAT one will send the sleep data to the phone.  How long do I need to wear the WatchPAT one?  After you start the study, you should wear the device at least 6 hours.  How far should I keep my phone from the device?  During the night, your phone should be within 15 feet.  What happens if I leave the room for restroom or other reasons?  Leaving the room for any reason will not cause any problem. As soon as your get back to the room, both devices will reconnect and will continue to send the sleep data. Can I use my phone during the sleep study?  Yes, you can use your phone as usual during  the study. But it is recommended to put your watchpat one on when you are ready to go to bed.  How will I get my study results?  A soon as you completed your study, your sleep data will be sent to the provider. They will then share the results with you when they are ready.    Follow-Up: At [facility], you and your health needs are our priority.  As part of our continuing mission to provide you with exceptional heart care, we have created designated Provider Care Teams.  These Care Teams include your primary Cardiologist (physician) and Advanced Practice Providers (APPs -  Physician Assistants and Nurse Practitioners) who all work together to provide you with the care you need, when you need it.  We recommend signing up for the patient portal called "MyChart".  Sign up information is provided on  this After Visit Summary.  MyChart is used to connect with patients for Virtual Visits (Telemedicine).  Patients are able to view lab/test results, encounter notes, upcoming appointments, etc.  Non-urgent messages can be sent to your provider as well.   To learn more about what you can do with MyChart, go to [email or web address].    Your next appointment:   1-2 months with [name], NP   &   6 months with Dr. Duke Salvia

## 2023-01-30 ENCOUNTER — Other Ambulatory Visit: Payer: Self-pay | Admitting: Family Medicine

## 2023-01-30 DIAGNOSIS — E039 Hypothyroidism, unspecified: Secondary | ICD-10-CM

## 2023-02-10 LAB — BASIC METABOLIC PANEL
BUN/Creatinine Ratio: 24 (ref 12–28)
BUN: 24 mg/dL (ref 8–27)
CO2: 24 mmol/L (ref 20–29)
Calcium: 8.7 mg/dL (ref 8.7–10.3)
Chloride: 103 mmol/L (ref 96–106)
Creatinine, Ser: 0.98 mg/dL (ref 0.57–1.00)
Glucose: 99 mg/dL (ref 70–99)
Potassium: 4.5 mmol/L (ref 3.5–5.2)
Sodium: 141 mmol/L (ref 134–144)
eGFR: 63 mL/min/{1.73_m2} (ref >59)

## 2023-02-20 ENCOUNTER — Telehealth (HOSPITAL_BASED_OUTPATIENT_CLINIC_OR_DEPARTMENT_OTHER): Payer: Self-pay | Admitting: Cardiovascular Disease

## 2023-02-20 ENCOUNTER — Telehealth: Payer: Self-pay | Admitting: Family Medicine

## 2023-02-20 DIAGNOSIS — I10 Essential (primary) hypertension: Secondary | ICD-10-CM

## 2023-02-20 DIAGNOSIS — I5032 Chronic diastolic (congestive) heart failure: Secondary | ICD-10-CM

## 2023-02-20 DIAGNOSIS — R400 Somnolence: Secondary | ICD-10-CM

## 2023-02-20 DIAGNOSIS — R0683 Snoring: Secondary | ICD-10-CM

## 2023-02-20 NOTE — Telephone Encounter (Signed)
Patient notified to wait for fall vaccine.

## 2023-02-20 NOTE — Telephone Encounter (Signed)
Patient called in and had some questions regarding covid vaccine. Thank you!

## 2023-02-20 NOTE — Telephone Encounter (Signed)
Patient is following-up to check if her sleep study test has been approved by her insurance.

## 2023-02-20 NOTE — Telephone Encounter (Signed)
Patient had Covid injection in October 2023. She wanted to see when she needs next one.

## 2023-02-20 NOTE — Telephone Encounter (Signed)
Hi ladies,   Can you please check on status of precert for this Itamar from last month?   TY!

## 2023-02-20 NOTE — Telephone Encounter (Signed)
I would await the next vaccine that is available this fall.  Thanks.

## 2023-02-22 NOTE — Telephone Encounter (Signed)
Prior Authorization for [facility] sent to [facility] via web portal. Tracking Number . 6/20 READY- NO PA REQ

## 2023-02-23 NOTE — Telephone Encounter (Signed)
Covering [employer], prior [employer] noted. Thank you, Coralee North. Let us know if anything else needed on our end!

## 2023-02-26 ENCOUNTER — Telehealth: Payer: Self-pay | Admitting: Family Medicine

## 2023-02-26 MED ORDER — GABAPENTIN 100 MG PO CAPS: 100.0000 mg | ORAL_CAPSULE | Freq: Three times a day (TID) | ORAL | 1 refills | Status: DC | PRN

## 2023-02-26 NOTE — Telephone Encounter (Signed)
If the pain is radiating down her leg, then reasonable to try gabapentin.  Let me know if she needs a handout re: back stretches.  If not better, if fever/weakness/worsening symptoms, then needs eval.  Thanks.

## 2023-02-26 NOTE — Telephone Encounter (Signed)
Patient contacted the office regarding symptoms she is having, states she is having sciatic pain. Asked if Dr. Para March could send her in something for pain, says she has taken gabapentin before but was not sure if he prescribed it. Advised patient I would send this message to ask but they may want her to come in to be evaluated before anything is prescribed. Patient stated she lives 4 hours away at the moment, but if she has to she will come in. Please advise, thank you.

## 2023-02-26 NOTE — Telephone Encounter (Signed)
Advised patient that rx was sent and to call back for an appt if she does not get any better or gets worse.

## 2023-02-28 ENCOUNTER — Encounter: Payer: Self-pay | Admitting: Family Medicine

## 2023-02-28 NOTE — Telephone Encounter (Signed)
Please see about getting her records from orthopedics in [place].  [facility] Address: [id number] [place], [place] [address] Hours:  Closed ? Opens 8?AM Thu Phone: [phone]

## 2023-03-01 NOTE — Telephone Encounter (Signed)
Called and spoke with [employer], they are going to fax over this patients records.

## 2023-03-02 ENCOUNTER — Ambulatory Visit (HOSPITAL_BASED_OUTPATIENT_CLINIC_OR_DEPARTMENT_OTHER): Payer: PRIVATE HEALTH INSURANCE | Admitting: Family

## 2023-03-04 NOTE — Telephone Encounter (Signed)
Will see at office visit tomorrow.

## 2023-03-05 ENCOUNTER — Ambulatory Visit: Payer: Medicare Other | Admitting: Family Medicine

## 2023-03-05 ENCOUNTER — Encounter: Payer: Self-pay | Admitting: Family Medicine

## 2023-03-05 VITALS — BP 120/70 | HR 91 | Temp 97.8°F | Ht 65.0 in | Wt 231.0 lb

## 2023-03-05 DIAGNOSIS — M5430 Sciatica, unspecified side: Secondary | ICD-10-CM

## 2023-03-05 MED ORDER — PREDNISONE 20 MG PO TABS: ORAL_TABLET | ORAL | 0 refills | Status: DC

## 2023-03-05 NOTE — Patient Instructions (Signed)
Start prednisone with food but hold diclofenac.    I'll work on the MRI in the meantime.  Keep taking gabapentin as is for now.  Take care.  Glad to see you.

## 2023-03-05 NOTE — Progress Notes (Unsigned)
Leg pain/sciatica.   She had relocated to [place].  She had prev cataract surgery.  She has seen neurology in [place].    Still having sig R leg pain, down from the back to the R foot.  Restarted gabapentin in the meantime.  Has been worse in the last few weeks.  She initially thought she pulled a muscle while on the exercise bike, at the last week of May.  Had seen ortho in the meantime.  Had steroid taper, that helped the pain but then it returned.    She had urinary urgency, leaking. Noted with sneezing- that has been going on for months.  We agreed to address her pain first.    Meds, vitals, and allergies reviewed.  ROS: Per HPI unless specifically indicated in ROS section   S/S grossly wnl BLE but pain with R hip flexion.   R hand tremor.   Midline back not ttp.    Needs MRI set up in [facility]

## 2023-03-07 DIAGNOSIS — M5430 Sciatica, unspecified side: Secondary | ICD-10-CM | POA: Insufficient documentation

## 2023-03-07 NOTE — Assessment & Plan Note (Signed)
Already with ongoing symptoms since May.  Previously seen at outside clinic and prescribed steroid with temporary relief.  She is going to need follow-up imaging.  Continue gabapentin.  Start prednisone with routine cautions.  She agrees to plan.  Will start the process for MRI. Needs MRI set up in [facility]

## 2023-03-08 ENCOUNTER — Encounter: Payer: Self-pay | Admitting: Family Medicine

## 2023-03-09 ENCOUNTER — Other Ambulatory Visit: Payer: Self-pay | Admitting: Family Medicine

## 2023-03-09 MED ORDER — PREDNISONE 20 MG PO TABS: ORAL_TABLET | ORAL | 0 refills | Status: DC

## 2023-03-12 NOTE — Telephone Encounter (Signed)
Please let me know what you hear about scheduling her MRI.  Thanks.

## 2023-03-14 ENCOUNTER — Other Ambulatory Visit: Payer: Self-pay | Admitting: Family Medicine

## 2023-03-15 NOTE — Telephone Encounter (Signed)
Refill request for GABAPENTIN 100 MG CAPSULE   LOV - 03/05/23 Next OV - not scheduled Last refill - 02/26/23 #40/1

## 2023-03-16 NOTE — Telephone Encounter (Signed)
Patient called in to follow up on this refill. She stated that she is out.

## 2023-03-16 NOTE — Telephone Encounter (Signed)
Sent. Thanks.   

## 2023-04-09 ENCOUNTER — Ambulatory Visit (HOSPITAL_BASED_OUTPATIENT_CLINIC_OR_DEPARTMENT_OTHER): Payer: Medicare Other | Admitting: Family

## 2023-04-09 ENCOUNTER — Encounter (HOSPITAL_BASED_OUTPATIENT_CLINIC_OR_DEPARTMENT_OTHER): Payer: Self-pay | Admitting: Family

## 2023-04-09 VITALS — BP 112/78 | HR 81 | Ht 65.0 in | Wt 232.0 lb

## 2023-04-09 DIAGNOSIS — I10 Essential (primary) hypertension: Secondary | ICD-10-CM | POA: Diagnosis not present

## 2023-04-09 DIAGNOSIS — I25118 Atherosclerotic heart disease of native coronary artery with other forms of angina pectoris: Secondary | ICD-10-CM

## 2023-04-09 DIAGNOSIS — R600 Localized edema: Secondary | ICD-10-CM | POA: Diagnosis not present

## 2023-04-09 DIAGNOSIS — R400 Somnolence: Secondary | ICD-10-CM | POA: Diagnosis not present

## 2023-04-09 DIAGNOSIS — I5032 Chronic diastolic (congestive) heart failure: Secondary | ICD-10-CM | POA: Diagnosis not present

## 2023-04-09 DIAGNOSIS — E785 Hyperlipidemia, unspecified: Secondary | ICD-10-CM

## 2023-04-09 NOTE — Progress Notes (Signed)
Cardiology Office Note:  .   Date:  04/09/2023  ID:  Traci [name], Traci [name] 13-Sep-1955, MRN [id number] PCP: Joaquim Nam, MD  [facility] Providers Cardiologist:  Chilton Si, MD    History of Present Illness: Traci [name]   [name] is a 67 y.o. female with hx of HTN, HLD, prior CVA, FM, HFpEF, obesity s/p gastric band, OSA, hypothyroidism, Parkinson's.   Admitted 11/2016 with chest pain. LHC mild luminal irregularities, no obstructive disease, LVEF 50-55%. Started on Carvedilol, diuresed with IV lasix. Symptoms improved with beta blocker.   ED visit 10/2020 right sided chest pain, troponin negative, CT negative for PE, symptoms musculoskeletal. At time not tolerating CPAP.   Echo 06/2022 LVEF 60-65%, mild LVH, gr1DD. Lasix initiated with improvement in LE edema.   Last seen 01/16/23 noting worsened edema and excessive sleepiness. Lasix increased to 40mg  daily. Home sleep study ordered. Losartan initiated for HTN.  Presents today for follow up. She had MRI of her back this weekend as has had persistent sciatic pain. Notes her LE swelling is better with Lasix.  If she sits with her legs up the swelling will improve. Swelling is bilateral and painful if swelling but not otherwise, worse at end of  day. Requests clearance for blepharoplasty. BP at home routinely <130/80. No new dyspnea, chest pain, palpitations.   ROS: Please see the history of present illness.    All other systems reviewed and are negative.   Studies Reviewed: Traci [name]   EKG Interpretation Date/Time:  Monday April 09 2023 14:49:42 EDT Ventricular Rate:  81 PR Interval:  190 QRS Duration:  82 QT Interval:  376 QTC Calculation: 436 R Axis:   15  Text Interpretation: Normal sinus rhythm  No acute ST/T wave changes. Confirmed by [name] ([id number]) on 04/09/2023 3:26:53 PM    Cardiac Studies & Procedures   CARDIAC CATHETERIZATION  CARDIAC CATHETERIZATION 11/22/2016  Narrative  The left ventricular  systolic function is normal.  LV end diastolic pressure is mildly elevated.  The left ventricular ejection fraction is 50-55% by visual estimate.  1. Minor luminal irregularities with no evidence of obstructive coronary artery disease. 2. Low normal LV systolic function with an EF of 50-55%. Mildly elevated left ventricular end-diastolic pressure (LVEDP between 16-20 mmHg).  Recommendations: Medical therapy. No culprit for unstable angina is identified.  Findings Coronary Findings Diagnostic  Dominance: Right  Left Main Vessel is angiographically normal.  Left Anterior Descending The vessel exhibits minimal luminal irregularities.  First Diagonal Branch Vessel is small in size. Vessel is angiographically normal.  Lateral First Diagonal Branch Vessel is small in size.  Second Diagonal Branch Vessel is small in size. Vessel is angiographically normal.  Third Diagonal Branch Vessel is small in size. Vessel is angiographically normal.  Left Circumflex Vessel is angiographically normal.  First Obtuse Marginal Branch Vessel is small in size. Vessel is angiographically normal.  Second Obtuse Marginal Branch Vessel is angiographically normal.  Third Obtuse Marginal Branch Vessel is angiographically normal.  Right Coronary Artery The vessel exhibits minimal luminal irregularities.  Right Posterior Descending Artery Vessel is angiographically normal.  Right Posterior Atrioventricular Artery Vessel is angiographically normal.  First Right Posterolateral Branch Vessel is angiographically normal.  Second Right Posterolateral Branch Vessel is angiographically normal.  Third Right Posterolateral Branch Vessel is angiographically normal.  Intervention  No interventions have been documented.     ECHOCARDIOGRAM  ECHOCARDIOGRAM COMPLETE 06/05/2022  Narrative ECHOCARDIOGRAM REPORT    Patient Name:   [facility]  [place] Date of Exam: 06/05/2022 Medical Rec #:   [id number]          Height:       65.0 in Accession #:    [id number]         Weight:       227.0 lb Date of Birth:  [birth year]          BSA:          2.088 m Patient Age:    66 years           BP:           148/93 mmHg Patient Gender: F                  HR:           88 bpm. Exam Location:  [address]  Procedure: 2D Echo, Cardiac Doppler, Color Doppler and Strain Analysis  Indications:    I50.31 CHF  History:        Patient has prior history of Echocardiogram examinations, most recent 11/22/2016. Stroke, Signs/Symptoms:Edema; Risk Factors:Hypertension, Sleep Apnea and HLD.  Sonographer:    Clearence Ped RCS Referring Phys: [id number] TIFFANY [place]  IMPRESSIONS   1. Left ventricular ejection fraction, by estimation, is 60 to 65%. The left ventricle has normal function. The left ventricle has no regional wall motion abnormalities. There is mild left ventricular hypertrophy. Left ventricular diastolic parameters are consistent with Grade I diastolic dysfunction (impaired relaxation). The average left ventricular global longitudinal strain is -16.7 %. The global longitudinal strain is normal. 2. Right ventricular systolic function is normal. The right ventricular size is normal. There is normal pulmonary artery systolic pressure. 3. The mitral valve is normal in structure. Trivial mitral valve regurgitation. No evidence of mitral stenosis. 4. The aortic valve is tricuspid. There is mild calcification of the aortic valve. There is mild thickening of the aortic valve. Aortic valve regurgitation is not visualized. Aortic valve sclerosis is present, with no evidence of aortic valve stenosis. 5. The inferior vena cava is normal in size with greater than 50% respiratory variability, suggesting right atrial pressure of 3 mmHg.  FINDINGS Left Ventricle: Left ventricular ejection fraction, by estimation, is 60 to 65%. The left ventricle has normal function. The left ventricle has no regional wall  motion abnormalities. The average left ventricular global longitudinal strain is -16.7 %. The global longitudinal strain is normal. The left ventricular internal cavity size was normal in size. There is mild left ventricular hypertrophy. Left ventricular diastolic parameters are consistent with Grade I diastolic dysfunction (impaired relaxation).  Right Ventricle: The right ventricular size is normal. No increase in right ventricular wall thickness. Right ventricular systolic function is normal. There is normal pulmonary artery systolic pressure. The tricuspid regurgitant velocity is 0.88 m/s, and with an assumed right atrial pressure of 3 mmHg, the estimated right ventricular systolic pressure is 6.1 mmHg.  Left Atrium: Left atrial size was normal in size.  Right Atrium: Right atrial size was normal in size.  Pericardium: There is no evidence of pericardial effusion.  Mitral Valve: The mitral valve is normal in structure. Trivial mitral valve regurgitation. No evidence of mitral valve stenosis.  Tricuspid Valve: The tricuspid valve is normal in structure. Tricuspid valve regurgitation is trivial. No evidence of tricuspid stenosis.  Aortic Valve: The aortic valve is tricuspid. There is mild calcification of the aortic valve. There is mild thickening of the aortic valve. Aortic valve regurgitation is not visualized. Aortic valve sclerosis  is present, with no evidence of aortic valve stenosis.  Pulmonic Valve: The pulmonic valve was normal in structure. Pulmonic valve regurgitation is not visualized. No evidence of pulmonic stenosis.  Aorta: The aortic root is normal in size and structure.  Venous: The inferior vena cava is normal in size with greater than 50% respiratory variability, suggesting right atrial pressure of 3 mmHg.  IAS/Shunts: No atrial level shunt detected by color flow Doppler.   LEFT VENTRICLE PLAX 2D LVIDd:         3.40 cm   Diastology LVIDs:         2.40 cm   LV e'  medial:    6.64 cm/s LV PW:         0.80 cm   LV E/e' medial:  11.1 LV IVS:        1.20 cm   LV e' lateral:   10.80 cm/s LVOT diam:     2.00 cm   LV E/e' lateral: 6.8 LV SV:         68 LV SV Index:   33        2D Longitudinal Strain LVOT Area:     3.14 cm  2D Strain GLS (A2C):   -17.3 % 2D Strain GLS (A3C):   -15.9 % 2D Strain GLS (A4C):   -17.1 % 2D Strain GLS Avg:     -16.7 %  RIGHT VENTRICLE RV Basal diam:  2.90 cm RV S prime:     9.14 cm/s TAPSE (M-mode): 1.8 cm RVSP:           6.1 mmHg  LEFT ATRIUM             Index        RIGHT ATRIUM           Index LA diam:        3.30 cm 1.58 cm/m   RA Pressure: 3.00 mmHg LA Vol (A2C):   29.6 ml 14.18 ml/m  RA Area:     15.30 cm LA Vol (A4C):   21.1 ml 10.11 ml/m  RA Volume:   38.80 ml  18.59 ml/m LA Biplane Vol: 26.6 ml 12.74 ml/m AORTIC VALVE LVOT Vmax:   112.00 cm/s LVOT Vmean:  74.800 cm/s LVOT VTI:    0.217 m  AORTA Ao Root diam: 2.80 cm Ao Asc diam:  3.40 cm  MITRAL VALVE                TRICUSPID VALVE MV Area (PHT):              TR Peak grad:   3.1 mmHg MV Decel Time:              TR Vmax:        88.20 cm/s MV E velocity: 73.90 cm/s   Estimated RAP:  3.00 mmHg MV A velocity: 113.00 cm/s  RVSP:           6.1 mmHg MV E/A ratio:  0.65 SHUNTS Systemic VTI:  0.22 m Systemic Diam: 2.00 cm  Charlton Haws MD Electronically signed by Charlton Haws MD Signature Date/Time: 06/05/2022/4:18:33 PM    Final             Risk Assessment/Calculations:         STOP-Bang Score:  6      Physical Exam:   VS:  BP 112/78   Pulse 81   Ht 5\' 5"  (1.651 m)   Wt 232 lb (105.2 kg)  BMI 38.61 kg/m    Wt Readings from Last 3 Encounters:  04/09/23 232 lb (105.2 kg)  03/05/23 231 lb (104.8 kg)  01/16/23 234 lb 8 oz (106.4 kg)    GEN: Well nourished, well developed in no acute distress NECK: No JVD; No carotid bruits CARDIAC: RRR, no murmurs, rubs, gallops RESPIRATORY:  Clear to auscultation without rales, wheezing or rhonchi   ABDOMEN: Soft, non-tender, non-distended EXTREMITIES:  No deformity; Nonpitting pretibial edema  ASSESSMENT AND PLAN: .    HFpEF / LE edema / Venous insufficiency - Weight down 2 lbs from last clinic visit. LE with nonpitting pretibial edema. Likely multifactorial venous insufficiency, HFpEF. Leg elevation encouraged. Did not tolerate compression stockings. Continue GDMT Carvedilol, Losartan, Lasix at present doses. May take additional 20mg  Lasix PRN once per week for breakthrough swelling.   Preop clearance - Upcoming blethoplasty. According to the Revised Cardiac Risk Index (RCRI), her Perioperative Risk of Major Cardiac Event is (%): 6.6. Exercise tolerance of 4.4 METS. Per AHA/ACC guidelines, she is deemed acceptable risk for the planned procedure without additional cardiovascular testing. May hold Aspirin 5-7 days prior to procedure. Provided her a letter to take with her detailing clearance in clinic.   HTN - BP well controlled. Continue current antihypertensive regimen Coreg 12.5mg  BID, Lasix 40mg  daily, Losartan 25mg  daily.  CAD / HLD, LDL goal <70 - Stable with no anginal symptoms. No indication for ischemic evaluation.  GDMT aspirin, Carvedilol. Rosuvastatin. Recommend aiming for 150 minutes of moderate intensity activity per week and following a heart healthy diet.    OSA - Previously provided [place] sleep study. Encouraged to complete.        Dispo: follow up as scheduled  Signed, Alver Sorrow, NP

## 2023-04-09 NOTE — Patient Instructions (Addendum)
Medication Instructions:  Your physician has recommended you make the following change in your medication:   You may take one Lasix 20mg  tablet weekly as needed for swelling   *If you need a refill on your cardiac medications before your next appointment, please call your pharmacy*   Follow-Up: At [facility], you and your health needs are our priority.  As part of our continuing mission to provide you with exceptional heart care, we have created designated Provider Care Teams.  These Care Teams include your primary Cardiologist (physician) and Advanced Practice Providers (APPs -  Physician Assistants and Nurse Practitioners) who all work together to provide you with the care you need, when you need it.  We recommend signing up for the patient portal called "MyChart".  Sign up information is provided on this After Visit Summary.  MyChart is used to connect with patients for Virtual Visits (Telemedicine).  Patients are able to view lab/test results, encounter notes, upcoming appointments, etc.  Non-urgent messages can be sent to your provider as well.   To learn more about what you can do with MyChart, go to [email or web address].    Your next appointment:   Follow up as scheduled   Other Instructions To prevent or reduce lower extremity swelling: Eat a low salt diet. Salt makes the body hold onto extra fluid which causes swelling. Sit with legs elevated. For example, in the recliner or on an ottoman.  Wear knee-high compression stockings during the daytime. Ones labeled 15-20 mmHg provide good compression.

## 2023-04-16 ENCOUNTER — Encounter: Payer: Self-pay | Admitting: Family Medicine

## 2023-04-18 ENCOUNTER — Other Ambulatory Visit: Payer: Self-pay | Admitting: Family Medicine

## 2023-04-18 DIAGNOSIS — M5430 Sciatica, unspecified side: Secondary | ICD-10-CM

## 2023-04-19 ENCOUNTER — Encounter: Payer: Self-pay | Admitting: *Deleted

## 2023-04-20 ENCOUNTER — Other Ambulatory Visit: Payer: Self-pay | Admitting: Family Medicine

## 2023-04-20 MED ORDER — GABAPENTIN 100 MG PO CAPS: 200.0000 mg | ORAL_CAPSULE | Freq: Three times a day (TID) | ORAL | 2 refills | Status: DC | PRN

## 2023-04-20 MED ORDER — PREDNISONE 20 MG PO TABS: ORAL_TABLET | ORAL | 0 refills | Status: DC

## 2023-05-07 NOTE — Telephone Encounter (Signed)
See MyChart message and please check with pharmacy about updating her vaccine record here in the EMR.  Thanks.

## 2023-06-05 ENCOUNTER — Other Ambulatory Visit: Payer: Self-pay | Admitting: Neurology

## 2023-06-05 ENCOUNTER — Other Ambulatory Visit: Payer: Self-pay | Admitting: Family Medicine

## 2023-06-05 ENCOUNTER — Other Ambulatory Visit (HOSPITAL_BASED_OUTPATIENT_CLINIC_OR_DEPARTMENT_OTHER): Payer: Self-pay | Admitting: Cardiovascular Disease

## 2023-06-21 ENCOUNTER — Encounter: Payer: Self-pay | Admitting: Adult Health

## 2023-06-28 ENCOUNTER — Other Ambulatory Visit: Payer: Self-pay | Admitting: Neurology

## 2023-06-28 ENCOUNTER — Other Ambulatory Visit: Payer: Self-pay | Admitting: Family Medicine

## 2023-06-28 DIAGNOSIS — E039 Hypothyroidism, unspecified: Secondary | ICD-10-CM

## 2023-06-30 ENCOUNTER — Encounter: Payer: Self-pay | Admitting: Family Medicine

## 2023-07-19 ENCOUNTER — Encounter (HOSPITAL_BASED_OUTPATIENT_CLINIC_OR_DEPARTMENT_OTHER): Payer: Self-pay | Admitting: Cardiovascular Disease

## 2023-07-19 ENCOUNTER — Ambulatory Visit (HOSPITAL_BASED_OUTPATIENT_CLINIC_OR_DEPARTMENT_OTHER): Payer: Medicare Other | Admitting: Cardiovascular Disease

## 2023-07-19 VITALS — BP 114/70 | HR 67 | Ht 65.0 in | Wt 222.2 lb

## 2023-07-19 DIAGNOSIS — I5032 Chronic diastolic (congestive) heart failure: Secondary | ICD-10-CM | POA: Diagnosis not present

## 2023-07-19 DIAGNOSIS — I10 Essential (primary) hypertension: Secondary | ICD-10-CM | POA: Diagnosis not present

## 2023-07-19 DIAGNOSIS — E7800 Pure hypercholesterolemia, unspecified: Secondary | ICD-10-CM

## 2023-07-19 DIAGNOSIS — I2510 Atherosclerotic heart disease of native coronary artery without angina pectoris: Secondary | ICD-10-CM | POA: Diagnosis not present

## 2023-07-19 NOTE — Progress Notes (Signed)
Cardiology Office Note:  .   Date:  07/19/2023  ID:  Traci [name], DOB [birth year], MRN [id number] PCP: Joaquim Nam, MD  [facility] Providers Cardiologist:  Chilton Si, MD    History of Present Illness: [name]    Traci [name] is a 67 y.o. female  with hypertension, hyperlipidemia, non-obstructive CAD, HFpEF, prior stroke, fibromyalgia, s/p gastric band, OSA and hypothyroidism who presents for follow up.  Traci [name] was admitted to the hospital 11/21/16 with three weeks of intermittent chest pain.  Cardiac enzymes were negative.  She underwent LHC 11/22/16 and had mild luminal irregularities but no obstructive disease.  LVEF was 50-55%.  LVEDP was mildly elevated at 16-20 mmHg.  BP was poorly controlled in the hospital.  She was started on carvedilol and diuresed with IV lasix.  Her echo during that hospitalization revealed LVEF 55-60% with mild LVH and grade 1 diastolic dysfunction.  Her heart catheterization was complicated by a small R groin hematoma.  She followed up with her PCP, Dr. Para March, who wondered if she may have microvascular angina given her improvement with a beta blocker.  She continued to complain of intermittent chest pain.  Carvedilol was increased and she was started on aspirin and rosuvastatin.     She was seen in the ED 10/2020 for right-sided chest pain. Cardiac enzymes were negative and CT scan was negative for PE. Her symptoms were thought to be musculoskeletal. At the last visit she was not tolerating her CPAP. Carvedilol was increased due to poorly controlled blood pressure. She followed up with our pharmacist and her blood pressure was better controlled.  At her visit 05/2022 she was doing well but did have some lower extremity edema.  She had an echo 06/2022 that revealed LVEF 60 to 65% with mild LVH and grade 1 diastolic dysfunction.  She was started on Lasix.  She followed up with Traci Shields, NP the following month and noted that her edema was  better but not completely resolved.  She was instructed to take an extra 20 mg of Lasix as needed and to wear compression stockings. Overall she is doing well today. She reports that she is having pain in her legs at night with swelling. The ankles are the primary area of swelling and she describes it as looking like "donuts" on the end of her legs. She will take aspirin and will have to lie down to resolve her pain and swelling. There will also be some bumps around the ankle as well. She thinks her symptoms may be caused by arthritis. When she lays down she reports feeling constricted, but denies orthopnea. She believes she has exercise induced asthma. She does experience some SOB when she exercises. Also, she has been diagnosed with arthritis that makes exercise difficult. At home she uses a stationary bike since walking is made difficult. [place] but has difficulty getting through hard packed sand. She has been trying to lose her weight. She reports that she does not use extra salt in her diet, except for boiling potatoes. Generally she is decreasing the amount of food she eats such as pizza and bread. She is overall trying to improve her diet in order to get her weight down. At home her blood pressure has been 140-142 systolic and high diastolic during previous clinic visits. She does monitor her BP at home. though she is not sure of the number. Since her last visit she has been diagnosed with  Parkinson's. Once in awhile she will develop tremors in her left leg but mainly in her right arm. Occasionally she has quivering in her lips. She has not had any falls since January. However, she did almost fall the other day while carrying some books up 2 stairs. She was able to catch herself. She denies any lightheadedness prior to the fall.   At her appointment 01/2023 she noted increasing lower extremity edema.  She was responding to Lasix but remains volume overloaded.  She reported some  orthopnea.  Lasix was increased from 20 mg to 40 mg.  Blood pressure was uncontrolled and she was started on losartan.  She was referred for an NMR sleep study that has not yet been performed.  There have been issues with insurance approval.  She followed up with Traci Shields, NP 04/2023 and noted improvement in her swelling and shortness of breath.  Blood pressure was also better controlled.  Traci [name] presents with recurrent episodes of low blood pressure and associated lethargy. She reports feeling unwell and experiencing shortness of breath during these episodes, which occur at least once or twice a week. She also notes difficulty walking due to back pain and breathlessness, which is exacerbated by climbing stairs.  Despite these challenges, Traci [name] has been actively trying to lose weight and has successfully lost 12 pounds since the last visit in May. She attributes this weight loss to a decrease in appetite and increased physical activity due to the care of a new puppy. However, she also mentions upcoming medical procedures, including nerve ablation for back pain and a hip replacement, which currently limit her mobility.  Traci [name] has been monitoring her blood pressure at home using a wrist cuff due to difficulty applying an arm cuff. She reports consistently low readings and denies any instances of high blood pressure. She also mentions some swelling in her legs, which has been managed with daily Lasix. She reports that this medication has significantly improved her symptoms and allows her to sleep better at night.      ROS:  As per HPI  Studies Reviewed: .       Echo 06/2022:    1. Left ventricular ejection fraction, by estimation, is 60 to 65%. The  left ventricle has normal function. The left ventricle has no regional  wall motion abnormalities. There is mild left ventricular hypertrophy.  Left ventricular diastolic parameters  are consistent with Grade I diastolic  dysfunction (impaired relaxation).  The average left ventricular global longitudinal strain is -16.7 %. The  global longitudinal strain is normal.   2. Right ventricular systolic function is normal. The right ventricular  size is normal. There is normal pulmonary artery systolic pressure.   3. The mitral valve is normal in structure. Trivial mitral valve  regurgitation. No evidence of mitral stenosis.   4. The aortic valve is tricuspid. There is mild calcification of the  aortic valve. There is mild thickening of the aortic valve. Aortic valve  regurgitation is not visualized. Aortic valve sclerosis is present, with  no evidence of aortic valve stenosis.   5. The inferior vena cava is normal in size with greater than 50%  respiratory variability, suggesting right atrial pressure of 3 mmHg.   Risk Assessment/Calculations:     Physical Exam:    VS:  BP 114/70   Pulse 67   Ht 5\' 5"  (1.651 m)   Wt 222 lb 3.2 oz (100.8 kg)   SpO2 92%   BMI 36.98  kg/m  , BMI Body mass index is 36.98 kg/m. GENERAL:  Well appearing HEENT: Pupils equal round and reactive, fundi not visualized, oral mucosa unremarkable NECK:  No jugular venous distention, waveform within normal limits, carotid upstroke brisk and symmetric, no bruits, no thyromegaly LUNGS:  Clear to auscultation bilaterally HEART:  RRR.  PMI not displaced or sustained,S1 and S2 within normal limits, no S3, no S4, no clicks, no rubs, no murmurs ABD:  Flat, positive bowel sounds normal in frequency in pitch, no bruits, no rebound, no guarding, no midline pulsatile mass, no hepatomegaly, no splenomegaly EXT:  2 plus pulses throughout, no edema, no cyanosis no clubbing SKIN:  No rashes no nodules NEURO:  Cranial nerves II through XII grossly intact, motor grossly intact throughout PSYCH:  Cognitively intact, oriented to person place and time   ASSESSMENT AND PLAN: .    # Hypotension Reports of low blood pressure readings and associated  symptoms of lethargy, shortness of breath, and limited exercise tolerance. No reports of high blood pressure readings. Likely contributing factors include weight loss and possible dysautonomia related to Parkinson's disease. -Reduce Carvedilol to 6.25mg  and Losartan to 12.5mg . -Track blood pressure at home and report if symptoms of lightheadedness and dizziness persist.  # Weight Loss Intentional weight loss of 12 pounds since May, likely contributing to lower blood pressure readings. -Continue current efforts.  # Fluid Retention Mild leg swelling reported, currently managed with Lasix. -Continue Lasix as prescribed. -Check BNP to assess heart failure status and adjust Lasix dosage if necessary.  # Parkinson's Disease No specific issues discussed during this visit, but potential contribution to hypotension noted. -No changes to current management plan.  # Upcoming Procedures Patient scheduled for nerve ablation for back pain and hip replacement surgery. -No specific plan discussed in relation to these procedures. -She is at acceptable cardiovascular risk for this procedure  General Health Maintenance -Order labs today including comprehensive metabolic panel, cholesterol, and BNP. -Follow-up with pharmacist in about a month to review blood pressure readings and make further medication adjustments if necessary. -Return visit in six months or sooner if needed.       Signed, Chilton Si, MD

## 2023-07-19 NOTE — Patient Instructions (Signed)
Medication Instructions:  Your physician recommends that you continue on your current medications as directed. Please refer to the Current Medication list given to you today.  *If you need a refill on your cardiac medications before your next appointment, please call your pharmacy*   Lab Work: Labs ordered for today: Lipids, BNP, CMP  If you have labs (blood work) drawn today and your tests are completely normal, you will receive your results only by: MyChart Message (if you have MyChart) OR A paper copy in the mail If you have any lab test that is abnormal or we need to change your treatment, we will call you to review the results.    Follow-Up: At [facility], you and your health needs are our priority.  As part of our continuing mission to provide you with exceptional heart care, we have created designated Provider Care Teams.  These Care Teams include your primary Cardiologist (physician) and Advanced Practice Providers (APPs -  Physician Assistants and Nurse Practitioners) who all work together to provide you with the care you need, when you need it.  We recommend signing up for the patient portal called "MyChart".  Sign up information is provided on this After Visit Summary.  MyChart is used to connect with patients for Virtual Visits (Telemedicine).  Patients are able to view lab/test results, encounter notes, upcoming appointments, etc.  Non-urgent messages can be sent to your provider as well.   To learn more about what you can do with MyChart, go to [email or web address].    Your next appointment:   Follow up with PharmD in 1 month. Follow up with Dr. Duke Salvia or [name], NP in 6 months.  Other Instructions Please bring your blood pressure cuff to your PharmD appointment.

## 2023-07-20 LAB — LIPID PANEL
Chol/HDL Ratio: 2.9 ratio (ref 0.0–4.4)
Cholesterol, Total: 108 mg/dL (ref 100–199)
HDL: 37 mg/dL — ABNORMAL LOW (ref >39)
LDL Chol Calc (NIH): 44 mg/dL (ref 0–99)
Triglycerides: 157 mg/dL — ABNORMAL HIGH (ref 0–149)
VLDL Cholesterol Cal: 27 mg/dL (ref 5–40)

## 2023-07-20 LAB — COMPREHENSIVE METABOLIC PANEL
ALT: 5 [IU]/L (ref 0–32)
AST: 17 [IU]/L (ref 0–40)
Albumin: 4.2 g/dL (ref 3.9–4.9)
Alkaline Phosphatase: 111 [IU]/L (ref 44–121)
BUN/Creatinine Ratio: 29 — ABNORMAL HIGH (ref 12–28)
BUN: 22 mg/dL (ref 8–27)
Bilirubin Total: 0.3 mg/dL (ref 0.0–1.2)
CO2: 22 mmol/L (ref 20–29)
Calcium: 8.8 mg/dL (ref 8.7–10.3)
Chloride: 107 mmol/L — ABNORMAL HIGH (ref 96–106)
Creatinine, Ser: 0.75 mg/dL (ref 0.57–1.00)
Globulin, Total: 2.1 g/dL (ref 1.5–4.5)
Glucose: 94 mg/dL (ref 70–99)
Potassium: 4.8 mmol/L (ref 3.5–5.2)
Sodium: 144 mmol/L (ref 134–144)
Total Protein: 6.3 g/dL (ref 6.0–8.5)
eGFR: 87 mL/min/{1.73_m2} (ref >59)

## 2023-07-20 LAB — BRAIN NATRIURETIC PEPTIDE: BNP: 35.8 pg/mL (ref 0.0–100.0)

## 2023-08-12 ENCOUNTER — Encounter (HOSPITAL_BASED_OUTPATIENT_CLINIC_OR_DEPARTMENT_OTHER): Payer: Self-pay | Admitting: Cardiovascular Disease

## 2023-08-20 ENCOUNTER — Telehealth: Payer: Self-pay | Admitting: *Deleted

## 2023-08-20 NOTE — Telephone Encounter (Signed)
   Pre-operative Risk Assessment    Patient Name: Traci [name]  DOB: Jun 14, 1956 MRN: [id number]  DATE OF LAST VISIT: 07/19/23 DR. Woodland DATE OF NEXT VISIT: 09/28/23 [name], NP    Request for Surgical Clearance    Procedure:   RIGHT TOTAL HIP ARTHROPLASTY  Date of Surgery:  Clearance 10/16/23                                 Surgeon:  DR. Lavon Paganini Surgeon's Group or Practice Name:  [facility] Phone number:  [phone] Fax number:  [phone]   Type of Clearance Requested:   - Medical  - Pharmacy:  Hold Aspirin     Type of Anesthesia:  Not Indicated   Additional requests/questions:    [name]   08/20/2023, 1:49 PM

## 2023-08-21 ENCOUNTER — Ambulatory Visit: Payer: Medicare Other

## 2023-08-21 NOTE — Telephone Encounter (Signed)
   Patient Name: Traci [name]  DOB: 01/17/1956 MRN: [id number]  Primary Cardiologist: Chilton Si, MD  Chart reviewed as part of pre-operative protocol coverage. Given past medical history and time since last visit, based on ACC/AHA guidelines, Traci [name] is at acceptable risk for the planned procedure without further cardiovascular testing.   Patient was determined to be at acceptable risk for surgery at most recent office visit with Dr. Duke Salvia on 07/19/2023.  Per office protocol, she may hold Aspirin for 5-7 days prior to procedure. Please resume Aspirin as soon as possible postprocedure, at the discretion of the surgeon.   I will route this recommendation to the requesting party via Epic fax function and remove from pre-op pool.  Please call with questions.  Joylene Grapes, NP 08/21/2023, 4:46 PM

## 2023-08-24 ENCOUNTER — Ambulatory Visit (INDEPENDENT_AMBULATORY_CARE_PROVIDER_SITE_OTHER): Payer: Medicare Other

## 2023-08-24 VITALS — Ht 65.0 in | Wt 217.0 lb

## 2023-08-24 DIAGNOSIS — Z0000 Encounter for general adult medical examination without abnormal findings: Secondary | ICD-10-CM | POA: Diagnosis not present

## 2023-08-24 NOTE — Patient Instructions (Signed)
Ms. Nolton , Thank you for taking time to come for your Medicare Wellness Visit. I appreciate your ongoing commitment to your health goals. Please review the following plan we discussed and let me know if I can assist you in the future.   Referrals/Orders/Follow-Ups/Clinician Recommendations: none  This is a list of the screening recommended for you and due dates:  Health Maintenance  Topic Date Due   Mammogram  07/17/2024   Medicare Annual Wellness Visit  08/23/2024   Cologuard (Stool DNA test)  06/27/2025   DTaP/Tdap/Td vaccine (3 - Td or Tdap) 06/28/2033   Pneumonia Vaccine  Completed   Flu Shot  Completed   DEXA scan (bone density measurement)  Completed   COVID-19 Vaccine  Completed   Hepatitis C Screening  Completed   Zoster (Shingles) Vaccine  Completed   HPV Vaccine  Aged Out    Advanced directives: (Declined) Advance directive discussed with you today. Even though you declined this today, please call our office should you change your mind, and we can give you the proper paperwork for you to fill out.  Next Medicare Annual Wellness Visit scheduled for next year: Yes 08/26/24 @ 1:40pm televisit

## 2023-08-24 NOTE — Progress Notes (Signed)
Subjective:   Traci [name] is a 67 y.o. female who presents for Medicare Annual (Subsequent) preventive examination.  Visit Complete: Virtual I connected with  [name] on 08/24/23 by a audio enabled telemedicine application and verified that I am speaking with the correct person using two identifiers.  Patient Location: Home  Provider Location: Office/Clinic  I discussed the limitations of evaluation and management by telemedicine. The patient expressed understanding and agreed to proceed.  Vital Signs: Because this visit was a virtual/telehealth visit, some criteria may be missing or patient reported. Any vitals not documented were not able to be obtained and vitals that have been documented are patient reported.  Patient Medicare AWV questionnaire was completed by the patient on (not done); I have confirmed that all information answered by patient is correct and no changes since this date.  Cardiac Risk Factors include: advanced age (>84men, >28 women);dyslipidemia;hypertension;sedentary lifestyle;obesity (BMI >30kg/m2)    Objective:    Today's Vitals   08/24/23 1337 08/24/23 1339  Weight: 217 lb (98.4 kg)   Height: 5\' 5"  (1.651 m)   PainSc:  5    Body mass index is 36.11 kg/m.     08/24/2023    1:53 PM 07/17/2022   10:38 AM 08/25/2021    2:33 PM 05/26/2021    7:50 AM 03/13/2021    9:12 PM 12/20/2020    3:36 PM 08/13/2020   10:53 AM  Advanced Directives  Does Patient Have a Medical Advance Directive? No Yes Yes No No Yes Yes  Type of Advance Directive  Living will    Healthcare Power of [place];Living will;Out of facility DNR (pink MOST or yellow form) Healthcare Power of [place];Living will;Out of facility DNR (pink MOST or yellow form)  Would patient like information on creating a medical advance directive?     No - Patient declined      Current Medications (verified) Outpatient Encounter Medications as of 08/24/2023  Medication Sig   alendronate  (FOSAMAX) 70 MG tablet TAKE 1 TABLET BY MOUTH EVERY 7 DAYS WITH FULL GLASS OF WATER ON EMPTY STOMACH   aspirin EC 81 MG tablet Take 81 mg by mouth daily.   Biotin 5 MG TABS Take 5 mg by mouth.   carbidopa-levodopa (SINEMET IR) 25-100 MG tablet TAKE 1.5 TABLETS BY MOUTH 3 TIMES DAILY.   Carbidopa-Levodopa ER (SINEMET CR) 25-100 MG tablet controlled release TAKE 1 TABLET BY MOUTH EVERYDAY AT BEDTIME   carvedilol (COREG) 12.5 MG tablet TAKE 1 TABLET (12.5MG  TOTAL) BY MOUTH TWICE A DAY WITH MEALS   Cholecalciferol (VITAMIN D) 50 MCG (2000 UT) tablet Take 2 tablets (4,000 Units total) by mouth daily.   diclofenac (VOLTAREN) 75 MG EC tablet Take 75 mg by mouth 2 (two) times daily.   furosemide (LASIX) 40 MG tablet Take 1 tablet (40 mg total) by mouth daily.   gabapentin (NEURONTIN) 100 MG capsule Take 2-3 capsules (200-300 mg total) by mouth 3 (three) times daily as needed.   levothyroxine (SYNTHROID) 137 MCG tablet TAKE 1 TABLET BY MOUTH EVERY DAY BEFORE BREAKFAST   lidocaine (LIDODERM) 5 % Place 1 patch onto the skin daily. Remove & Discard patch within 12 hours or as directed by MD   Multiple Vitamin (MULTIVITAMIN) tablet Take 1 tablet by mouth daily.   omeprazole (PRILOSEC) 20 MG capsule TAKE 1 CAPSULE BY MOUTH EVERY DAY   rosuvastatin (CRESTOR) 20 MG tablet TAKE 1 TABLET BY MOUTH EVERY DAY   losartan (COZAAR) 25 MG tablet Take  1 tablet (25 mg total) by mouth daily.   No facility-administered encounter medications on file as of 08/24/2023.    Allergies (verified) Lexapro [escitalopram oxalate], Meperidine hcl, Simvastatin, Rexulti [brexpiprazole], Tape, Venlafaxine, Codeine, and Tramadol   History: Past Medical History:  Diagnosis Date   Abnormal alkaline phosphatase test    with neg eval prev   Arthritis    CAD in native artery 07/13/2020   Chronic diastolic heart failure (HCC) 01/13/2021   Depression    Previously responded to Wellbutrin   Headache(784.0)    Hyperlipidemia     Hypothyroidism    Migraines    Osteoporosis    Fosamax start 10/2020   Stroke (HCC) 12/2000   Thyroid disease    Hypothyroidism   Past Surgical History:  Procedure Laterality Date   CATARACT EXTRACTION Bilateral    CHOLECYSTECTOMY     ? 20 years ago   LAPAROSCOPIC GASTRIC BANDING  09/04/2004   LEFT HEART CATH AND CORONARY ANGIOGRAPHY N/A 11/22/2016   Procedure: Left Heart Cath and Coronary Angiography;  Surgeon: Iran Ouch, MD;  Location: [facility] INVASIVE CV LAB;  Service: Cardiovascular;  Laterality: N/A;   TOTAL KNEE ARTHROPLASTY Right 11/26/2012   Procedure: TOTAL KNEE ARTHROPLASTY- right ;  Surgeon: Velna Ochs, MD;  Location: [facility] OR;  Service: Orthopedics;  Laterality: Right;   Family History  Problem Relation Age of Onset   Stroke Mother    Diabetes Mother    Heart attack Father        Deceased, 48   Cancer Other        Ovarian, Uterine   Heart disease Other    Melanoma Sister    Healthy Brother    Colon cancer Neg Hx    Breast cancer Neg Hx    Social History   Socioeconomic History   Marital status: Single    Spouse name: Not on file   Number of children: 0   Years of education: 16   Highest education level: Not on file  Occupational History   Occupation: Scientist, physiological: UNEMPLOYED  Tobacco Use   Smoking status: Never    Passive exposure: Never   Smokeless tobacco: Never  Vaping Use   Vaping status: Never Used  Substance and Sexual Activity   Alcohol use: No    Alcohol/week: 0.0 standard drinks of alcohol    Comment: "maybe one on new years"   Drug use: No   Sexual activity: Not on file  Other Topics Concern   Not on file  Social History Narrative   Regular exercise:  Yes    Works for city of [employer] level of education:  Automotive engineer, IT consultant   Lives with roommate in a 2 story home.     No children.    Drinks Iced Tea   Left Handed    Social Drivers of Health   Financial Resource Strain: Low Risk  (08/24/2023)   Overall  Financial Resource Strain (CARDIA)    Difficulty of Paying Living Expenses: Not hard at all  Food Insecurity: No Food Insecurity (08/24/2023)   Hunger Vital Sign    Worried About Running Out of Food in the Last Year: Never true    Ran Out of Food in the Last Year: Never true  Transportation Needs: No Transportation Needs (08/24/2023)   PRAPARE - Administrator, Civil Service (Medical): No    Lack of Transportation (Non-Medical): No  Physical Activity: Inactive (08/24/2023)   Exercise  Vital Sign    Days of Exercise per Week: 0 days    Minutes of Exercise per Session: 0 min  Stress: No Stress Concern Present (08/24/2023)   [employer] of Occupational Health - Occupational Stress Questionnaire    Feeling of Stress : Only a little  Social Connections: Unknown (08/24/2023)   Social Connection and Isolation Panel [NHANES]    Frequency of Communication with Friends and Family: Three times a week    Frequency of Social Gatherings with Friends and Family: Three times a week    Attends Religious Services: More than 4 times per year    Active Member of Clubs or Organizations: No    Attends Banker Meetings: Never    Marital Status: Patient declined    Tobacco Counseling Counseling given: Not Answered   Clinical Intake:  Pre-visit preparation completed: No  Pain : 0-10 Pain Score: 5  Pain Type: Acute pain, Other (Comment) (pt had ablation on lower back) Pain Location: Back Pain Orientation: Lower Pain Descriptors / Indicators: Aching Pain Onset: In the past 7 days Pain Frequency: Constant Pain Relieving Factors: aleive,asa,  Pain Relieving Factors: aleive,asa,  BMI - recorded: 36.11 Nutritional Status: BMI > 30  Obese Nutritional Risks: None Diabetes: No  How often do you need to have someone help you when you read instructions, pamphlets, or other written materials from your doctor or pharmacy?: 1 - Never  Interpreter Needed?: No  Comments:  live alone Information entered by :: B.Bryceton Hantz,LPN   Activities of Daily Living    08/24/2023    1:53 PM  In your present state of health, do you have any difficulty performing the following activities:  Hearing? 0  Vision? 1  Difficulty concentrating or making decisions? 0  Walking or climbing stairs? 1  Dressing or bathing? 0  Doing errands, shopping? 0  Preparing Food and eating ? N  Using the Toilet? N  In the past six months, have you accidently leaked urine? Y  Do you have problems with loss of bowel control? N  Managing your Medications? N  Managing your Finances? N  Housekeeping or managing your Housekeeping? N    Patient Care Team: Joaquim Nam, MD as PCP - General Chilton Si, MD as PCP - Cardiology (Cardiology) Glendale Chard, DO as Consulting Physician (Neurology)  Indicate any recent Medical Services you may have received from other than [facility] providers in the past year (date may be approximate).     Assessment:   This is a routine wellness examination for [facility].  Hearing/Vision screen Hearing Screening - Comments:: Pt says her hearing is good Vision Screening - Comments:: Pt says she wears glasses:had cataract surgery    Goals Addressed             This Visit's Progress    Patient Stated       Pt wants to be able to walk independantly and on the beach;I would like to lose lbs       Depression Screen    08/24/2023    1:48 PM 03/05/2023   12:48 PM 01/26/2020   12:18 PM  PHQ 2/9 Scores  PHQ - 2 Score 0 0 1  PHQ- 9 Score  4 4    Fall Risk    08/24/2023    1:44 PM 03/05/2023   12:47 PM 07/17/2022   10:37 AM 08/25/2021    2:32 PM 05/26/2021    7:51 AM  Fall Risk   Falls in the  past year? 0 0 0 1 1  Number falls in past yr: 0 0 0 1 1  Injury with Fall? 0 0 0 1 1  Risk for fall due to : No Fall Risks No Fall Risks     Follow up Education provided;Falls prevention discussed Falls evaluation completed Falls evaluation  completed      MEDICARE RISK AT HOME: Medicare Risk at Home Any stairs in or around the home?: Yes If so, are there any without handrails?: Yes Home free of loose throw rugs in walkways, pet beds, electrical cords, etc?: Yes Adequate lighting in your home to reduce risk of falls?: Yes Life alert?: No Use of a cane, walker or w/c?: No Grab bars in the bathroom?: No Shower chair or bench in shower?: No Elevated toilet seat or a handicapped toilet?: No  TIMED UP AND GO:  Was the test performed?  No    Cognitive Function:      05/27/2018    2:21 PM  Montreal Cognitive Assessment   Visuospatial/ Executive (0/5) 5  Naming (0/3) 3  Attention: Read list of digits (0/2) 2  Attention: Read list of letters (0/1) 1  Attention: Serial 7 subtraction starting at 100 (0/3) 1  Language: Repeat phrase (0/2) 2  Language : Fluency (0/1) 1  Abstraction (0/2) 2  Delayed Recall (0/5) 0  Orientation (0/6) 5  Total 22  Adjusted Score (based on education) 22      08/24/2023    1:55 PM  6CIT Screen  What Year? 0 points  What month? 0 points  What time? 0 points  Count back from 20 0 points  Months in reverse 0 points  Repeat phrase 0 points  Total Score 0 points   Immunizations Immunization History  Administered Date(s) Administered   Fluad Quad(high Dose 65+) 06/05/2022, 06/29/2023   Influenza Inj Mdck Quad Pf 06/04/2019   Influenza Whole 06/16/2013   Influenza, High Dose Seasonal PF 05/14/2020, 05/19/2021   Influenza,inj,Quad PF,6+ Mos 06/22/2017   Influenza-Unspecified 05/28/2014, 06/08/2015, 06/04/2019   Moderna Sars-Covid-2 Vaccination 06/21/2022   PFIZER(Purple Top)SARS-COV-2 Vaccination 10/27/2019, 11/17/2019, 06/26/2020   PNEUMOCOCCAL CONJUGATE-20 04/12/2023   Pfizer Covid-19 Vaccine Bivalent Booster 39yrs & up 07/13/2021   Pfizer(Comirnaty)Fall Seasonal Vaccine 12 years and older 05/09/2023   Pneumococcal Conjugate-13 01/26/2020   Respiratory Syncytial Virus  Vaccine,Recomb Aduvanted(Arexvy) 04/12/2023   Tdap 07/28/2011, 06/29/2023   Zoster Recombinant(Shingrix) 11/06/2017, 02/04/2018   Zoster, Live 12/02/2014    TDAP status: Up to date  Flu Vaccine status: Up to date  Pneumococcal vaccine status: Up to date  Covid-19 vaccine status: Completed vaccines  Qualifies for Shingles Vaccine? Yes   Zostavax completed Yes   Shingrix Completed?: Yes  Screening Tests Health Maintenance  Topic Date Due   MAMMOGRAM  07/17/2024   Medicare Annual Wellness (AWV)  08/23/2024   Fecal DNA (Cologuard)  06/27/2025   DTaP/Tdap/Td (3 - Td or Tdap) 06/28/2033   Pneumonia Vaccine 36+ Years old  Completed   INFLUENZA VACCINE  Completed   DEXA SCAN  Completed   COVID-19 Vaccine  Completed   Hepatitis C Screening  Completed   Zoster Vaccines- Shingrix  Completed   HPV VACCINES  Aged Out    Health Maintenance  There are no preventive care reminders to display for this patient.   Colorectal cancer screening: Type of screening: Cologuard. Completed 06/2422. Repeat every 3 years  Mammogram status: Completed 07/18/23. Repeat every year  Bone Density status: Completed 04/14/2020. Results reflect: Bone density results:  OSTEOPOROSIS. Repeat every 3-5 years.  Lung Cancer Screening: (Low Dose CT Chest recommended if Age 68-80 years, 20 pack-year currently smoking OR have quit w/in 15years.) does not qualify.   Lung Cancer Screening Referral: no  Additional Screening:  Hepatitis C Screening: does not qualify; Completed 06/11/2015  Vision Screening: Recommended annual ophthalmology exams for early detection of glaucoma and other disorders of the eye. Is the patient up to date with their annual eye exam?  Yes  Who is the provider or what is the name of the office in which the patient attends annual eye exams? Does not remember If pt is not established with a provider, would they like to be referred to a provider to establish care? No .   Dental Screening:  Recommended annual dental exams for proper oral hygiene  Diabetic Foot Exam: n/a  Community Resource Referral / Chronic Care Management: CRR required this visit?  No   CCM required this visit?  Appt made w/PCP    Plan:     I have personally reviewed and noted the following in the patient's chart:   Medical and social history Use of alcohol, tobacco or illicit drugs  Current medications and supplements including opioid prescriptions. Patient is not currently taking opioid prescriptions. Functional ability and status Nutritional status Physical activity Advanced directives List of other physicians Hospitalizations, surgeries, and ER visits in previous 12 months Vitals Screenings to include cognitive, depression, and falls Referrals and appointments  In addition, I have reviewed and discussed with patient certain preventive protocols, quality metrics, and best practice recommendations. A written personalized care plan for preventive services as well as general preventive health recommendations were provided to patient.     Sue Lush, LPN   13/04/6577   After Visit Summary: (MyChart) Due to this being a telephonic visit, the after visit summary with patients personalized plan was offered to patient via MyChart   Nurse Notes: Pt states she had an ablation on her lower back on 08/22/23. She relays she is doing better. He pain has been managed by the pain clinic. She praises her results. Pt has no concerns or questions but needed a f/u w/PCP (appt made).

## 2023-09-04 ENCOUNTER — Other Ambulatory Visit: Payer: Self-pay | Admitting: Family Medicine

## 2023-09-04 DIAGNOSIS — Z1231 Encounter for screening mammogram for malignant neoplasm of breast: Secondary | ICD-10-CM

## 2023-09-06 ENCOUNTER — Other Ambulatory Visit: Payer: Self-pay | Admitting: Family Medicine

## 2023-09-11 ENCOUNTER — Other Ambulatory Visit (HOSPITAL_BASED_OUTPATIENT_CLINIC_OR_DEPARTMENT_OTHER): Payer: Self-pay | Admitting: Cardiovascular Disease

## 2023-09-11 ENCOUNTER — Other Ambulatory Visit: Payer: Self-pay | Admitting: Family Medicine

## 2023-09-11 DIAGNOSIS — E039 Hypothyroidism, unspecified: Secondary | ICD-10-CM

## 2023-09-12 ENCOUNTER — Telehealth (HOSPITAL_BASED_OUTPATIENT_CLINIC_OR_DEPARTMENT_OTHER): Payer: Self-pay

## 2023-09-12 NOTE — Telephone Encounter (Signed)
 Noted.

## 2023-09-12 NOTE — Telephone Encounter (Signed)
 Called pt to verify dosage of COREG, notes from November visit state to reduce from 12.5 mg to 6.25 mg. Will refill medication when pt calls back to clarify.

## 2023-09-12 NOTE — Telephone Encounter (Signed)
 Patient is returning call. She states she has been taking Coreg 12.5 MG once daily, but she does not need a new prescription because she has plenty.

## 2023-09-27 ENCOUNTER — Ambulatory Visit: Payer: Medicare Other | Admitting: Family Medicine

## 2023-09-28 ENCOUNTER — Ambulatory Visit (HOSPITAL_BASED_OUTPATIENT_CLINIC_OR_DEPARTMENT_OTHER): Payer: Medicare Other | Admitting: Family

## 2023-09-28 ENCOUNTER — Ambulatory Visit: Payer: Medicare Other

## 2023-10-03 ENCOUNTER — Telehealth: Payer: Self-pay

## 2023-10-03 DIAGNOSIS — D649 Anemia, unspecified: Secondary | ICD-10-CM

## 2023-10-03 NOTE — Telephone Encounter (Signed)
Left message for Traci [name] at [facility] to return call. Not showing where we received any paperwork for a surgical clearance. When did she fax the request to [place]? Please resend

## 2023-10-03 NOTE — Telephone Encounter (Signed)
Copied from CRM [id number]. Topic: General - Other >> Oct 02, 2023  4:45 PM Hector Shade B wrote: Reason for CRM: Clydie Braun of [facility] is request status of medical clearance for patient phone number is [phone] ext [phone]

## 2023-10-03 NOTE — Telephone Encounter (Signed)
Please schedule a preop OV when possible, even if we haven't gotten the forms from ortho yet.  Thanks.

## 2023-10-04 NOTE — Telephone Encounter (Signed)
I am not showing that we received any paperwork. I reached out to [place] yesterday as well as today and left voicemail advising have not received paperwork. In addition to leaving the fax number.

## 2023-10-04 NOTE — Telephone Encounter (Signed)
Please check on Monday if you don't get an update in the meantime.  Thanks.

## 2023-10-04 NOTE — Telephone Encounter (Signed)
Patient has appointment scheduled 2/3. Have we received ppw?

## 2023-10-07 ENCOUNTER — Other Ambulatory Visit: Payer: Self-pay | Admitting: Neurology

## 2023-10-08 ENCOUNTER — Encounter: Payer: Self-pay | Admitting: Family Medicine

## 2023-10-08 ENCOUNTER — Ambulatory Visit (INDEPENDENT_AMBULATORY_CARE_PROVIDER_SITE_OTHER): Payer: Medicare Other | Admitting: Family Medicine

## 2023-10-08 VITALS — BP 118/64 | HR 84 | Temp 98.4°F | Ht 65.0 in | Wt 222.8 lb

## 2023-10-08 DIAGNOSIS — M810 Age-related osteoporosis without current pathological fracture: Secondary | ICD-10-CM | POA: Diagnosis not present

## 2023-10-08 DIAGNOSIS — E559 Vitamin D deficiency, unspecified: Secondary | ICD-10-CM | POA: Diagnosis not present

## 2023-10-08 DIAGNOSIS — M25559 Pain in unspecified hip: Secondary | ICD-10-CM | POA: Diagnosis not present

## 2023-10-08 DIAGNOSIS — I10 Essential (primary) hypertension: Secondary | ICD-10-CM | POA: Diagnosis not present

## 2023-10-08 DIAGNOSIS — E039 Hypothyroidism, unspecified: Secondary | ICD-10-CM | POA: Diagnosis not present

## 2023-10-08 MED ORDER — VITAMIN D 50 MCG (2000 UT) PO TABS: 2000.0000 [IU] | ORAL_TABLET | Freq: Every day | ORAL | Status: AC

## 2023-10-08 NOTE — Progress Notes (Signed)
 Cardiology Office Note:  .   Date:  10/09/2023  ID:  Traci [name], Traci [name] 1956/02/16, MRN [id number] PCP: Traci Arlyss RAMAN, MD  [facility] Providers Cardiologist:  Traci Scarce, MD    History of Present Illness: Traci   Margerie [name] is a 68 y.o. female with history of HTN, HLD, nonobstructive CAD, HFpEF, prior stroke, fibromyalgia, s/p gastric band, OSA, hypothyroidism, Parkinson's.  Admitted 11/2016 with chest pain. LHC 11/22/16 with mild luminal irregularities but no obstructive disease. Echo LVEF 55-60%, mild LVH, gr1DD. LHC complicated by small R groin hematoma. She was diuresed and started on Carvedilol .   Repeat echo 06/2022 due to LE edema LVEF 60-65%, mild LVH, gr1dd.   Last seen 07/19/23, Carvedilol  reduced to 6.25mg  and Losartan  to 12.5mg . She had successful intentional weight loss of 12 lbs.   Pending right total hip arthroplasty 10/16/23. Presents today for follow up.  She has a wrist BP cuff at home but does not trust the accuracy and has not checked routinely. Notes feeling tired which she worries may be related to her hypotension. She is taking Coreg  only once per day. Walking her dog for exercise. Some swelling by the end of the day which is painful. Does improve with elevation. She did not do home sleep study as she was not sleeping at night. She does still have sleep study at home. Her back ablation did help her back symptoms significantly   ROS: Please see the history of present illness.    All other systems reviewed and are negative.   Studies Reviewed: Traci   EKG Interpretation Date/Time:  Tuesday October 09 2023 08:56:28 EST Ventricular Rate:  77 PR Interval:  176 QRS Duration:  76 QT Interval:  404 QTC Calculation: 457 R Axis:   4  Text Interpretation: Normal sinus rhythm Poor R wave progression No acute ST/T wave changes. Confirmed by Traci [name] ([id number]) on 10/09/2023 9:02:06 AM    Cardiac Studies & Procedures   CARDIAC  CATHETERIZATION  CARDIAC CATHETERIZATION 11/22/2016  Narrative  The left ventricular systolic function is normal.  LV end diastolic pressure is mildly elevated.  The left ventricular ejection fraction is 50-55% by visual estimate.  1. Minor luminal irregularities with no evidence of obstructive coronary artery disease. 2. Low normal LV systolic function with an EF of 50-55%. Mildly elevated left ventricular end-diastolic pressure (LVEDP between 16-20 mmHg).  Recommendations: Medical therapy. No culprit for unstable angina is identified.  Findings Coronary Findings Diagnostic  Dominance: Right  Left Main Vessel is angiographically normal.  Left Anterior Descending The vessel exhibits minimal luminal irregularities.  First Diagonal Branch Vessel is small in size. Vessel is angiographically normal.  Lateral First Diagonal Branch Vessel is small in size.  Second Diagonal Branch Vessel is small in size. Vessel is angiographically normal.  Third Diagonal Branch Vessel is small in size. Vessel is angiographically normal.  Left Circumflex Vessel is angiographically normal.  First Obtuse Marginal Branch Vessel is small in size. Vessel is angiographically normal.  Second Obtuse Marginal Branch Vessel is angiographically normal.  Third Obtuse Marginal Branch Vessel is angiographically normal.  Right Coronary Artery The vessel exhibits minimal luminal irregularities.  Right Posterior Descending Artery Vessel is angiographically normal.  Right Posterior Atrioventricular Artery Vessel is angiographically normal.  First Right Posterolateral Branch Vessel is angiographically normal.  Second Right Posterolateral Branch Vessel is angiographically normal.  Third Right Posterolateral Branch Vessel is angiographically normal.  Intervention  No interventions have been documented.  ECHOCARDIOGRAM  ECHOCARDIOGRAM COMPLETE 06/05/2022  Narrative ECHOCARDIOGRAM  REPORT    Patient Name:   Traci [name] Date of Exam: 06/05/2022 Medical Rec #:  [id number]          Height:       65.0 in Accession #:    [id number]         Weight:       227.0 lb Date of Birth:  April 12, 1956          BSA:          2.088 m Patient Age:    66 years           BP:           148/93 mmHg Patient Gender: F                  HR:           88 bpm. Exam Location:  [address]  Procedure: 2D Echo, Cardiac Doppler, Color Doppler and Strain Analysis  Indications:    I50.31 CHF  History:        Patient has prior history of Echocardiogram examinations, most recent 11/22/2016. Stroke, Signs/Symptoms:Edema; Risk Factors:Hypertension, Sleep Apnea and HLD.  Sonographer:    Traci [name] Traci [name]  IMPRESSIONS   1. Left ventricular ejection fraction, by estimation, is 60 to 65%. The left ventricle has normal function. The left ventricle has no regional wall motion abnormalities. There is mild left ventricular hypertrophy. Left ventricular diastolic parameters are consistent with Grade I diastolic dysfunction (impaired relaxation). The average left ventricular global longitudinal strain is -16.7 %. The global longitudinal strain is normal. 2. Right ventricular systolic function is normal. The right ventricular size is normal. There is normal pulmonary artery systolic pressure. 3. The mitral valve is normal in structure. Trivial mitral valve regurgitation. No evidence of mitral stenosis. 4. The aortic valve is tricuspid. There is mild calcification of the aortic valve. There is mild thickening of the aortic valve. Aortic valve regurgitation is not visualized. Aortic valve sclerosis is present, with no evidence of aortic valve stenosis. 5. The inferior vena cava is normal in size with greater than 50% respiratory variability, suggesting right atrial pressure of 3 mmHg.  FINDINGS Left Ventricle: Left ventricular ejection fraction, by estimation, is 60  to 65%. The left ventricle has normal function. The left ventricle has no regional wall motion abnormalities. The average left ventricular global longitudinal strain is -16.7 %. The global longitudinal strain is normal. The left ventricular internal cavity size was normal in size. There is mild left ventricular hypertrophy. Left ventricular diastolic parameters are consistent with Grade I diastolic dysfunction (impaired relaxation).  Right Ventricle: The right ventricular size is normal. No increase in right ventricular wall thickness. Right ventricular systolic function is normal. There is normal pulmonary artery systolic pressure. The tricuspid regurgitant velocity is 0.88 m/s, and with an assumed right atrial pressure of 3 mmHg, the estimated right ventricular systolic pressure is 6.1 mmHg.  Left Atrium: Left atrial size was normal in size.  Right Atrium: Right atrial size was normal in size.  Pericardium: There is no evidence of pericardial effusion.  Mitral Valve: The mitral valve is normal in structure. Trivial mitral valve regurgitation. No evidence of mitral valve stenosis.  Tricuspid Valve: The tricuspid valve is normal in structure. Tricuspid valve regurgitation is trivial. No evidence of tricuspid stenosis.  Aortic Valve: The aortic valve is tricuspid. There is mild calcification of the aortic  valve. There is mild thickening of the aortic valve. Aortic valve regurgitation is not visualized. Aortic valve sclerosis is present, with no evidence of aortic valve stenosis.  Pulmonic Valve: The pulmonic valve was normal in structure. Pulmonic valve regurgitation is not visualized. No evidence of pulmonic stenosis.  Aorta: The aortic root is normal in size and structure.  Venous: The inferior vena cava is normal in size with greater than 50% respiratory variability, suggesting right atrial pressure of 3 mmHg.  IAS/Shunts: No atrial level shunt detected by color flow Doppler.   LEFT  VENTRICLE PLAX 2D LVIDd:         3.40 cm   Diastology LVIDs:         2.40 cm   LV e' medial:    6.64 cm/s LV PW:         0.80 cm   LV E/e' medial:  11.1 LV IVS:        1.20 cm   LV e' lateral:   10.80 cm/s LVOT diam:     2.00 cm   LV E/e' lateral: 6.8 LV SV:         68 LV SV Index:   33        2D Longitudinal Strain LVOT Area:     3.14 cm  2D Strain GLS (A2C):   -17.3 % 2D Strain GLS (A3C):   -15.9 % 2D Strain GLS (A4C):   -17.1 % 2D Strain GLS Avg:     -16.7 %  RIGHT VENTRICLE RV Basal diam:  2.90 cm RV S prime:     9.14 cm/s TAPSE (M-mode): 1.8 cm RVSP:           6.1 mmHg  LEFT ATRIUM             Index        RIGHT ATRIUM           Index LA diam:        3.30 cm 1.58 cm/m   RA Pressure: 3.00 mmHg LA Vol (A2C):   29.6 ml 14.18 ml/m  RA Area:     15.30 cm LA Vol (A4C):   21.1 ml 10.11 ml/m  RA Volume:   38.80 ml  18.59 ml/m LA Biplane Vol: 26.6 ml 12.74 ml/m AORTIC VALVE LVOT Vmax:   112.00 cm/s LVOT Vmean:  74.800 cm/s LVOT VTI:    0.217 m  AORTA Ao Root diam: 2.80 cm Ao Asc diam:  3.40 cm  MITRAL VALVE                TRICUSPID VALVE MV Area (PHT):              TR Peak grad:   3.1 mmHg MV Decel Time:              TR Vmax:        88.20 cm/s MV E velocity: 73.90 cm/s   Estimated RAP:  3.00 mmHg MV A velocity: 113.00 cm/s  RVSP:           6.1 mmHg MV E/A ratio:  0.65 SHUNTS Systemic VTI:  0.22 m Systemic Diam: 2.00 cm  Maude Emmer MD Electronically signed by Maude Emmer MD Signature Date/Time: 06/05/2022/4:18:33 PM    Final             Risk Assessment/Calculations:         STOP-Bang Score:  6      Physical Exam:   VS:  BP (!) 100/56 (BP  Location: Right Arm, Patient Position: Sitting, Cuff Size: Large)   Pulse 79   Ht 5' 5 (1.651 m)   Wt 224 lb 8 oz (101.8 kg)   SpO2 91%   BMI 37.36 kg/m    Wt Readings from Last 3 Encounters:  10/09/23 224 lb 8 oz (101.8 kg)  10/08/23 222 lb 12.8 oz (101.1 kg)  08/24/23 217 lb (98.4 kg)    GEN: Well  nourished, well developed in no acute distress NECK: No JVD; No carotid bruits CARDIAC: RRR, no murmurs, rubs, gallops RESPIRATORY:  Clear to auscultation without rales, wheezing or rhonchi  ABDOMEN: Soft, non-tender, non-distended EXTREMITIES:  Nonpitting pretibial edema; No deformity   ASSESSMENT AND PLAN: .    Preop -pending hip surgery. According to the Revised Cardiac Risk Index (RCRI), her Perioperative Risk of Major Cardiac Event is (%): 6.6. Her Functional Capacity in METs is: 4.73 according to the Duke Activity Status Index (DASI). Per AHA/ACC guidelines, she is deemed acceptable risk for the planned procedure without additional cardiovascular testing. Will route to surgical team so they are aware.  Per office protocol may hold aspirin  5 to 7 days prior to planned procedure.  HTN -hypotensive in clinic today with BP 100/56.  Will discontinue losartan -she was not taking reduced dose as previously recommended.  Will continue carvedilol  12.5 mg, encouraged her to take twice daily as prescribed.  If persistent hypotension could consider reteaching dose at follow-up.  ?OSA -sleep study previously approved but has not completed as she was having difficulties with sleep related to back and hip pain.  She will bring Itamar to next OV. If still with concerns for sleep apnea, plan to redo prior auth. If no concerns, will simply return.   HFpEF / LE edema / Venous insufficiency -minimal nonpitting pretibial edema.  Continue furosemide  40 mg daily, carvedilol  12.5 mg twice daily.  Encourage leg elevation, low-sodium diet.  CAD / HLD, LDL goal <70 -  Stable with no anginal symptoms. No indication for ischemic evaluation.  GDMT includes aspirin , carvedilol , rosuvastatin .       Dispo: follow up in 2 mos  Signed, Reche GORMAN Finder, NP

## 2023-10-08 NOTE — Telephone Encounter (Signed)
Spoke with patient in office today at appointment. She advised that only a letter will be needed for clearance

## 2023-10-08 NOTE — Progress Notes (Unsigned)
Plan for R total hip arthoplasty.  D/w pt about preop considerations. Failed tx short of surgery.  She isn't expected to improve or have lower risk with delay of surgery.    Fortunately prev ablation helped her back pain.    She is limited from hip pain, not from cardiopulmonary issue.  She has cardiology f/u pending.   H/o osteoporosis with recheck vit D pending.    H/o HTN, EKG from prior noted and reviewed.  CBC pending.  See notes on labs.   Meds, vitals, and allergies reviewed.   ROS: Per HPI unless specifically indicated in ROS section   GEN: nad, alert and oriented HEENT: ncat NECK: supple w/o LA CV: rrr.  PULM: ctab, no inc wob ABD: soft, +bs EXT: no edema SKIN: well perfused.  Pain with R hip ROM.

## 2023-10-08 NOTE — Patient Instructions (Addendum)
Stop aspirin, aleve, and diclofenac.  After surgery, don't take aleve and diclofenac together.   Hold losartan day of surgery.  Take care.  Glad to see you.

## 2023-10-09 ENCOUNTER — Encounter: Payer: Self-pay | Admitting: Family Medicine

## 2023-10-09 ENCOUNTER — Ambulatory Visit (INDEPENDENT_AMBULATORY_CARE_PROVIDER_SITE_OTHER): Payer: Medicare Other | Admitting: Family

## 2023-10-09 ENCOUNTER — Encounter (HOSPITAL_BASED_OUTPATIENT_CLINIC_OR_DEPARTMENT_OTHER): Payer: Self-pay | Admitting: Family

## 2023-10-09 VITALS — BP 100/56 | HR 79 | Ht 65.0 in | Wt 224.5 lb

## 2023-10-09 DIAGNOSIS — E785 Hyperlipidemia, unspecified: Secondary | ICD-10-CM

## 2023-10-09 DIAGNOSIS — I10 Essential (primary) hypertension: Secondary | ICD-10-CM

## 2023-10-09 DIAGNOSIS — M25559 Pain in unspecified hip: Secondary | ICD-10-CM | POA: Insufficient documentation

## 2023-10-09 DIAGNOSIS — I5032 Chronic diastolic (congestive) heart failure: Secondary | ICD-10-CM | POA: Diagnosis not present

## 2023-10-09 DIAGNOSIS — I25118 Atherosclerotic heart disease of native coronary artery with other forms of angina pectoris: Secondary | ICD-10-CM | POA: Diagnosis not present

## 2023-10-09 DIAGNOSIS — Z01810 Encounter for preprocedural cardiovascular examination: Secondary | ICD-10-CM | POA: Diagnosis not present

## 2023-10-09 LAB — CBC WITH DIFFERENTIAL/PLATELET
Basophils Absolute: 0.1 10*3/uL (ref 0.0–0.1)
Basophils Relative: 1.4 % (ref 0.0–3.0)
Eosinophils Absolute: 0.2 10*3/uL (ref 0.0–0.7)
Eosinophils Relative: 3.5 % (ref 0.0–5.0)
HCT: 33 % — ABNORMAL LOW (ref 36.0–46.0)
Hemoglobin: 11.3 g/dL — ABNORMAL LOW (ref 12.0–15.0)
Lymphocytes Relative: 20 % (ref 12.0–46.0)
Lymphs Abs: 1.3 10*3/uL (ref 0.7–4.0)
MCHC: 34.1 g/dL (ref 30.0–36.0)
MCV: 95.1 fL (ref 78.0–100.0)
Monocytes Absolute: 0.7 10*3/uL (ref 0.1–1.0)
Monocytes Relative: 10.6 % (ref 3.0–12.0)
Neutro Abs: 4.3 10*3/uL (ref 1.4–7.7)
Neutrophils Relative %: 64.5 % (ref 43.0–77.0)
Platelets: 241 10*3/uL (ref 150.0–400.0)
RBC: 3.47 Mil/uL — ABNORMAL LOW (ref 3.87–5.11)
RDW: 13.8 % (ref 11.5–15.5)
WBC: 6.6 10*3/uL (ref 4.0–10.5)

## 2023-10-09 LAB — VITAMIN D 25 HYDROXY (VIT D DEFICIENCY, FRACTURES): VITD: 38.16 ng/mL (ref 30.00–100.00)

## 2023-10-09 LAB — TSH: TSH: 0.53 u[IU]/mL (ref 0.35–5.50)

## 2023-10-09 NOTE — Assessment & Plan Note (Signed)
See notes on labs.  Appears to be appropriately low risk for surgery.  Stop aspirin, aleve, and diclofenac.  After surgery, don't take aleve and diclofenac together.   Hold losartan day of surgery.

## 2023-10-09 NOTE — Assessment & Plan Note (Signed)
See notes on labs re: vit D.

## 2023-10-09 NOTE — Telephone Encounter (Signed)
Please send copy of OV to ortho.  Appears to be appropriately low risk for surgery.   Please send a letter to patient to recheck CBC/iron/ferritin/B12 (dx D64.9) 1 month after surgery.  That way she can get labs collected locally.    Thanks.

## 2023-10-09 NOTE — Patient Instructions (Addendum)
 Medication Instructions:  Your physician has recommended you make the following change in your medication:   STOP losartan   Be sure to take your Carvedilol  twice per day as it only lasts 12 hours in your system.   *If you need a refill on your cardiac medications before your next appointment, please call your pharmacy*  Follow-Up: At [facility], you and your health needs are our priority.  As part of our continuing mission to provide you with exceptional heart care, we have created designated Provider Care Teams.  These Care Teams include your primary Cardiologist (physician) and Advanced Practice Providers (APPs -  Physician Assistants and Nurse Practitioners) who all work together to provide you with the care you need, when you need it.  We recommend signing up for the patient portal called MyChart.  Sign up information is provided on this After Visit Summary.  MyChart is used to connect with patients for Virtual Visits (Telemedicine).  Patients are able to view lab/test results, encounter notes, upcoming appointments, etc.  Non-urgent messages can be sent to your provider as well.   To learn more about what you can do with MyChart, go to [email or web address].    Your next appointment:   2 month(s)  Provider:   Reche Finder, NP    Other Instructions

## 2023-10-09 NOTE — Assessment & Plan Note (Signed)
See notes on labs re: TSH.

## 2023-10-10 ENCOUNTER — Encounter: Payer: Self-pay | Admitting: Family Medicine

## 2023-10-10 NOTE — Telephone Encounter (Signed)
 Information sent

## 2023-10-18 ENCOUNTER — Telehealth: Payer: Self-pay

## 2023-10-18 NOTE — Transitions of Care (Post Inpatient/ED Visit) (Signed)
10/18/2023  Name: Traci [name] MRN: [id number] DOB: 07-29-1956  Today's TOC FU Call Status: Today's TOC FU Call Status:: Successful TOC FU Call Completed TOC FU Call Complete Date: 10/18/23 Patient's Name and Date of Birth confirmed.  Transition Care Management Follow-up Telephone Call Date of Discharge: 10/17/23 Discharge Facility: Other [facility]) Name of Other (Non[facility]) Discharge Facility: [facility] Type of Discharge: Inpatient Admission Primary Inpatient Discharge Diagnosis:: R total Hip Arthroplasty How have you been since you were released from the hospital?: Better Any questions or concerns?: No  Items Reviewed: Did you receive and understand the discharge instructions provided?: Yes Medications obtained,verified, and reconciled?: Yes (Medications Reviewed) Any new allergies since your discharge?: No Dietary orders reviewed?: NA Do you have support at home?: Yes People in Home: sibling(s) Name of Support/Comfort Primary Source: Britta Mccreedy, sister  Medications Reviewed Today: Medications Reviewed Today     Reviewed by Redge Gainer, RN (Case Manager) on 10/18/23 at 1309  Med List Status: <None>   Medication Order Taking? Sig Documenting Provider Last Dose Status Informant  alendronate (FOSAMAX) 70 MG tablet [id number] No TAKE 1 TABLET BY MOUTH EVERY 7 DAYS WITH FULL GLASS OF WATER ON EMPTY STOMACH Joaquim Nam, MD Taking Active   aspirin EC 81 MG tablet [id number] No Take 81 mg by mouth daily. [provider] Taking Active Self  Biotin 5 MG TABS [id number] No Take 5 mg by mouth. [provider] Taking Active Self  carbidopa-levodopa (SINEMET IR) 25-100 MG tablet [id number] No TAKE 1.5 TABLETS BY MOUTH 3 TIMES DAILY. Drema Dallas, DO Taking Active   Carbidopa-Levodopa ER (SINEMET CR) 25-100 MG tablet controlled release [id number] No TAKE 1 TABLET BY MOUTH EVERYDAY AT BEDTIME Glendale Chard, DO Taking Active   carvedilol (COREG)  12.5 MG tablet [id number] No TAKE 1 TABLET (12.5MG  TOTAL) BY MOUTH TWICE A DAY WITH MEALS Chilton Si, MD Taking Active   Cholecalciferol (VITAMIN D) 50 MCG (2000 UT) tablet [id number] No Take 1 tablet (2,000 Units total) by mouth daily. Joaquim Nam, MD Taking Active   diclofenac (VOLTAREN) 75 MG EC tablet [id number] No Take 75 mg by mouth 2 (two) times daily. [provider] Taking Active   furosemide (LASIX) 40 MG tablet [id number] No Take 1 tablet (40 mg total) by mouth daily. Chilton Si, MD Taking Active   gabapentin (NEURONTIN) 100 MG capsule [id number] No Take 2-3 capsules (200-300 mg total) by mouth 3 (three) times daily as needed. Joaquim Nam, MD Taking Active   levothyroxine (SYNTHROID) 137 MCG tablet [id number] No TAKE 1 TABLET BY MOUTH EVERY DAY BEFORE BREAKFAST Joaquim Nam, MD Taking Active   lidocaine (LIDODERM) 5 % [id number] No Place 1 patch onto the skin daily. Remove & Discard patch within 12 hours or as directed by MD Tegeler, Canary Brim, MD Taking Active   Multiple Vitamin (MULTIVITAMIN) tablet [id number] No Take 1 tablet by mouth daily. [provider] Taking Active Self  omeprazole (PRILOSEC) 20 MG capsule [id number] No TAKE 1 CAPSULE BY MOUTH EVERY DAY Joaquim Nam, MD Taking Active   rosuvastatin (CRESTOR) 20 MG tablet [id number] No TAKE 1 TABLET BY MOUTH EVERY DAY Chilton Si, MD Taking Active             Home Care and Equipment/Supplies: Were Home Health Services Ordered?: Yes Name of Home Health Agency:: She does not remember the name Has Agency set up a time to come to your home?: Yes First Home Health Visit Date:  10/18/23 Any new equipment or medical supplies ordered?: Yes Name of Medical supply agency?: Unknown Were you able to get the equipment/medical supplies?: Yes Do you have any questions related to the use of the equipment/supplies?: No  Functional Questionnaire: Do you need assistance with  bathing/showering or dressing?: Yes Do you need assistance with meal preparation?: Yes Do you need assistance with eating?: No Do you have difficulty maintaining continence: No Do you need assistance with getting out of bed/getting out of a chair/moving?: Yes Do you have difficulty managing or taking your medications?: No  Follow up appointments reviewed: PCP Follow-up appointment confirmed?: NA Specialist Hospital Follow-up appointment confirmed?: Yes Date of Specialist follow-up appointment?: 11/02/23 Follow-Up Specialty Provider:: Orthopedic surgeon Do you need transportation to your follow-up appointment?: No Do you understand care options if your condition(s) worsen?: Yes-patient verbalized understanding  SDOH Interventions Today    Flowsheet Row Most Recent Value  SDOH Interventions   Food Insecurity Interventions Intervention Not Indicated  Housing Interventions Intervention Not Indicated  Transportation Interventions Intervention Not Indicated  Utilities Interventions Intervention Not Indicated      Interventions Today    Flowsheet Row Most Recent Value  Chronic Disease   Chronic disease during today's visit Other  [Parkinson's]  General Interventions   General Interventions Discussed/Reviewed General Interventions Discussed, Doctor Visits  Doctor Visits Discussed/Reviewed Doctor Visits Discussed, Doctor Visits Reviewed, Annual Wellness Visits  Exercise Interventions   Exercise Discussed/Reviewed Physical Activity  Physical Activity Discussed/Reviewed Physical Activity Discussed  [Starting HHPT today]  Education Interventions   Education Provided Provided Education  Provided Verbal Education On Development worker, community, Medication  Pharmacy Interventions   Pharmacy Dicussed/Reviewed Medications and their functions  Safety Interventions   Safety Discussed/Reviewed Fall Risk  Advanced Directive Interventions   Advanced Directives Discussed/Reviewed Advanced Directives  Reviewed        TOC Outreach completed today. The patient lives in [place] but she continues with her PCP in [place]. She just saw Dr. Para March 10/08/23 for pre-op clearance. She had a Right total Hip Arthroplasty done 10/16/23 and is discharged home. She sister is staying with her right now because she lives alone. HHPT will start today. She states she is not in a lot of pain at her hip but right quad is sore and feels weak. She declines follow up with PCP.   The patient has been provided with contact information for the care management team and has been advised to call with any health-related questions or concerns. The patient verbalized understanding with current POC. The patient is directed to their insurance card regarding availability of benefits coverage.  Deidre Ala, BSN, RN [place]  VBCI - [employer] [facility] RN Care Manager [phone]

## 2023-11-06 LAB — LAB REPORT - SCANNED: EGFR: 60.5

## 2023-11-07 ENCOUNTER — Telehealth: Payer: Self-pay

## 2023-11-07 NOTE — Transitions of Care (Post Inpatient/ED Visit) (Signed)
   11/07/2023  Name: Traci [name] MRN: [id number] DOB: 05-27-1956  Today's TOC FU Call Status: Today's TOC FU Call Status:: Successful TOC FU Call Completed (The patient answered the phone. She is still in the hospital) [facility] FU Call Complete Date: 11/07/23  Attempted to reach the patient regarding the most recent Inpatient/ED visit.  Follow Up Plan: No further outreach attempts will be made at this time. We have been unable to contact the patient. The patient fell and broke her femur and had surgery. The hospital was trying to discharge her home but she is going to SNF instead because she is unable to care for herself.    Deidre Ala, BSN, RN [place]  VBCI - [employer] [facility] RN Care Manager [phone]

## 2023-11-28 ENCOUNTER — Telehealth: Payer: Self-pay

## 2023-11-28 NOTE — Transitions of Care (Post Inpatient/ED Visit) (Signed)
 I spoke withpt and she had hip replacemnt on 10/16/23; pt fell on 11/06/23 and fx hip and femur. pt is presently in rehab at [facility] in [place].Pt is getting physical and occupational therapy daily. pt walking with walker and will be there probably 4-6 more wks due to pt lives at home by herself. Pt said she is doing better and when she returns home pt will call for appt with Dr Para March.sending note to Dr Para March.       11/28/2023  Name: Traci [name] MRN: [id number] DOB: May 06, 1956  Today's TOC FU Call Status: Today's TOC FU Call Status:: Successful TOC FU Call Completed TOC FU Call Complete Date: 11/28/23 Patient's Name and Date of Birth confirmed.  Transition Care Management Follow-up Telephone Call Date of Discharge: 11/23/23 Discharge Facility: Other ([facility]) Name of Other ([facility]) Discharge Facility: [facility] Type of Discharge: Emergency Department Reason for ED Visit: Other: (I spoke withpt and she had hip replacemnt on 10/16/23; pt fell on 11/06/23 and fx  hip and femur. pt is presently in rehab at [facility] in [place]. pt walking with walker and will be therre probably 4-6 more wks.) How have you been since you were released from the hospital?: Better Any questions or concerns?: No  Items Reviewed: Did you receive and understand the discharge instructions provided?: Yes Medications obtained,verified, and reconciled?: No Medications Not Reviewed Reasons:: Other: (pt said rehab center is givingher all her meds at this time.) Any new allergies since your discharge?: No Dietary orders reviewed?: NA Do you have support at home?: No (presently pt at rehab facility due to pt lives alone.)  Medications Reviewed Today: Medications Reviewed Today   Medications were not reviewed in this encounter     Home Care and Equipment/Supplies: Were Home Health Services Ordered?: NA (pt in rehab facility) Any  new equipment or medical supplies ordered?: NA  Functional Questionnaire: Do you need assistance with bathing/showering or dressing?: Yes Do you need assistance with meal preparation?: Yes Do you need assistance with eating?: No Do you have difficulty maintaining continence: No Do you need assistance with getting out of bed/getting out of a chair/moving?: Yes Do you have difficulty managing or taking your medications?: No  Follow up appointments reviewed: PCP Follow-up appointment confirmed?: NA Specialist Hospital Follow-up appointment confirmed?: NA Do you need transportation to your follow-up appointment?: No Do you understand care options if your condition(s) worsen?: Yes-patient verbalized understanding    SIGNATURE Lewanda Rife, LPN

## 2023-11-28 NOTE — Telephone Encounter (Signed)
 Noted. Thanks.

## 2023-12-10 ENCOUNTER — Other Ambulatory Visit (HOSPITAL_BASED_OUTPATIENT_CLINIC_OR_DEPARTMENT_OTHER): Payer: Self-pay | Admitting: Cardiovascular Disease

## 2023-12-10 DIAGNOSIS — I5032 Chronic diastolic (congestive) heart failure: Secondary | ICD-10-CM

## 2023-12-10 DIAGNOSIS — I10 Essential (primary) hypertension: Secondary | ICD-10-CM

## 2023-12-11 ENCOUNTER — Other Ambulatory Visit: Payer: Self-pay | Admitting: Family Medicine

## 2023-12-11 DIAGNOSIS — E039 Hypothyroidism, unspecified: Secondary | ICD-10-CM

## 2023-12-21 ENCOUNTER — Ambulatory Visit (HOSPITAL_BASED_OUTPATIENT_CLINIC_OR_DEPARTMENT_OTHER): Payer: Medicare Other | Admitting: Family

## 2023-12-24 ENCOUNTER — Encounter: Payer: Self-pay | Admitting: Family Medicine

## 2023-12-24 ENCOUNTER — Telehealth (INDEPENDENT_AMBULATORY_CARE_PROVIDER_SITE_OTHER): Admitting: Family Medicine

## 2023-12-24 VITALS — BP 128/84 | Ht 65.0 in | Wt 211.0 lb

## 2023-12-24 DIAGNOSIS — S7290XD Unspecified fracture of unspecified femur, subsequent encounter for closed fracture with routine healing: Secondary | ICD-10-CM

## 2023-12-24 DIAGNOSIS — D649 Anemia, unspecified: Secondary | ICD-10-CM | POA: Diagnosis not present

## 2023-12-24 NOTE — Progress Notes (Signed)
 Virtual visit completed through caregility or similar program Patient location: home  Provider location: [place] at [facility], office  Participants: Patient and me (unless stated otherwise below)  Limitations and rationale for visit method d/w patient.  Patient agreed to proceed.  Patient identified by 2 identifiers. If vitals are not listed, then patient was unable to self-report due to a lack of equipment at home via telehealth  CC: follow up  HPI: She is home now.  She had hip replacement, then later had femur fracture.  Then had hospitalization then 5 weeks in rehab.  She had transfusion while she was inpatient.    No hip pain now but she is having BLE edema.  Only recently restarted wearing shoes. Had used lasix  prev with some relief but less effect recently.    On eliquis per ortho, until she has follow up.    She is taking celecoxib 200mg .  D/w pt about cessation to see if the swelling improved.    She isn't taking losartan  and BP is controlled.    She is living alone with a friend coming into stay this week.    Meds and allergies reviewed.   ROS: Per HPI unless specifically indicated in ROS section   NAD Speech wnl  A/P: Status post hip replacement with subsequent femur fracture.  History of anemia noted.  Now completed rehab stay and back home.  Taking Celebrex, with lower extremity edema noted. Will stop celebrex and see if swelling improves.  Recheck labs pending.  Letter printed to send to patient so she can get labs collected locally.

## 2023-12-25 ENCOUNTER — Telehealth: Payer: Self-pay

## 2023-12-25 NOTE — Telephone Encounter (Signed)
 Called and advised Traci [name] with [facility] of the approval of the requested verbal orders for this patient. Advised to call back with any further questions.

## 2023-12-25 NOTE — Telephone Encounter (Signed)
 Copied from CRM [id number]. Topic: Clinical - Home Health Verbal Orders >> Dec 24, 2023  5:02 PM Luane Rumps D wrote: Caller/Agency: [facility]  Callback Number: [phone] Service Requested: Physical Therapy Frequency: 2x/week 3 week, 1x/week 5 week Any new concerns about the patient? No

## 2023-12-25 NOTE — Telephone Encounter (Signed)
 Please give the order.  Thanks.

## 2023-12-26 DIAGNOSIS — S7290XA Unspecified fracture of unspecified femur, initial encounter for closed fracture: Secondary | ICD-10-CM | POA: Insufficient documentation

## 2023-12-26 NOTE — Assessment & Plan Note (Signed)
 Status post hip replacement with subsequent femur fracture.  History of anemia noted.  Now completed rehab stay and back home.  Taking Celebrex, with lower extremity edema noted. Will stop celebrex and see if swelling improves.  Recheck labs pending.  Letter printed to send to patient so she can get labs collected locally.

## 2024-01-10 DIAGNOSIS — G20A1 Parkinson's disease without dyskinesia, without mention of fluctuations: Secondary | ICD-10-CM | POA: Diagnosis not present

## 2024-01-10 DIAGNOSIS — E049 Nontoxic goiter, unspecified: Secondary | ICD-10-CM

## 2024-01-10 DIAGNOSIS — E7800 Pure hypercholesterolemia, unspecified: Secondary | ICD-10-CM

## 2024-01-10 DIAGNOSIS — Z9181 History of falling: Secondary | ICD-10-CM

## 2024-01-10 DIAGNOSIS — Z9049 Acquired absence of other specified parts of digestive tract: Secondary | ICD-10-CM

## 2024-01-10 DIAGNOSIS — G8929 Other chronic pain: Secondary | ICD-10-CM

## 2024-01-10 DIAGNOSIS — I5030 Unspecified diastolic (congestive) heart failure: Secondary | ICD-10-CM | POA: Diagnosis not present

## 2024-01-10 DIAGNOSIS — M1990 Unspecified osteoarthritis, unspecified site: Secondary | ICD-10-CM

## 2024-01-10 DIAGNOSIS — M9701XD Periprosthetic fracture around internal prosthetic right hip joint, subsequent encounter: Secondary | ICD-10-CM | POA: Diagnosis not present

## 2024-01-10 DIAGNOSIS — I110 Hypertensive heart disease with heart failure: Secondary | ICD-10-CM | POA: Diagnosis not present

## 2024-01-10 DIAGNOSIS — E039 Hypothyroidism, unspecified: Secondary | ICD-10-CM

## 2024-01-10 DIAGNOSIS — Z8673 Personal history of transient ischemic attack (TIA), and cerebral infarction without residual deficits: Secondary | ICD-10-CM

## 2024-01-11 ENCOUNTER — Other Ambulatory Visit: Payer: Self-pay | Admitting: Family Medicine

## 2024-01-12 LAB — CMETAB+FERRITIN
AST: 15 IU/L (ref 0–40)
Albumin: 4.3 g/dL (ref 3.9–4.9)
Alkaline Phosphatase: 181 IU/L — ABNORMAL HIGH (ref 44–121)
BUN/Creatinine Ratio: 30 — ABNORMAL HIGH (ref 12–28)
BUN: 22 mg/dL (ref 8–27)
Bilirubin Total: 0.3 mg/dL (ref 0.0–1.2)
Calcium: 8.9 mg/dL (ref 8.7–10.3)
Chloride: 105 mmol/L (ref 96–106)
Creatinine, Ser: 0.73 mg/dL (ref 0.57–1.00)
Ferritin: 126 ng/mL (ref 15–150)
Globulin, Total: 2.3 g/dL (ref 1.5–4.5)
Glucose: 93 mg/dL (ref 70–99)
Potassium: 4.6 mmol/L (ref 3.5–5.2)
Sodium: 144 mmol/L (ref 134–144)
Total Protein: 6.6 g/dL (ref 6.0–8.5)
eGFR: 89 mL/min/{1.73_m2} (ref >59)

## 2024-01-12 LAB — CBC WITH DIFFERENTIAL/PLATELET
Basophils Absolute: 0 10*3/uL (ref 0.0–0.2)
Basos: 1 %
EOS (ABSOLUTE): 0.3 10*3/uL (ref 0.0–0.4)
Eos: 6 %
Hematocrit: 32.9 % — ABNORMAL LOW (ref 34.0–46.6)
Hemoglobin: 10.5 g/dL — ABNORMAL LOW (ref 11.1–15.9)
Immature Grans (Abs): 0 10*3/uL (ref 0.0–0.1)
Immature Granulocytes: 1 %
Lymphocytes Absolute: 1 10*3/uL (ref 0.7–3.1)
Lymphs: 17 %
MCH: 29.8 pg (ref 26.6–33.0)
MCHC: 31.9 g/dL (ref 31.5–35.7)
MCV: 94 fL (ref 79–97)
Monocytes Absolute: 0.6 10*3/uL (ref 0.1–0.9)
Monocytes: 9 %
Neutrophils Absolute: 4.2 10*3/uL (ref 1.4–7.0)
Neutrophils: 66 %
Platelets: 242 10*3/uL (ref 150–450)
RBC: 3.52 x10E6/uL — ABNORMAL LOW (ref 3.77–5.28)
RDW: 13.4 % (ref 11.7–15.4)
WBC: 6.2 10*3/uL (ref 3.4–10.8)

## 2024-01-12 LAB — IRON: Iron: 45 ug/dL (ref 27–139)

## 2024-01-12 LAB — VITAMIN D 25 HYDROXY (VIT D DEFICIENCY, FRACTURES): Vit D, 25-Hydroxy: 46 ng/mL (ref 30.0–100.0)

## 2024-01-21 ENCOUNTER — Ambulatory Visit: Payer: Self-pay | Admitting: Family Medicine

## 2024-01-21 DIAGNOSIS — D649 Anemia, unspecified: Secondary | ICD-10-CM

## 2024-01-30 ENCOUNTER — Encounter: Payer: Self-pay | Admitting: Family Medicine

## 2024-01-30 ENCOUNTER — Other Ambulatory Visit: Payer: Self-pay

## 2024-01-30 DIAGNOSIS — D649 Anemia, unspecified: Secondary | ICD-10-CM

## 2024-01-30 NOTE — Addendum Note (Signed)
 Addended by: Eller Gut on: 01/30/2024 03:07 PM   Modules accepted: Orders

## 2024-02-15 ENCOUNTER — Telehealth: Payer: Self-pay

## 2024-02-15 NOTE — Telephone Encounter (Signed)
 Copied from CRM [id number]. Topic: Clinical - Home Health Verbal Orders >> Feb 15, 2024  3:32 PM Dimple Francis wrote: Caller/Agency: [facility]- [facility] Callback Number: [phone] Service Requested: Physical Therapy Frequency: 2 times a week for 2 weeks and 1 times a week for 3 weeks  Any new concerns about the patient? No

## 2024-02-16 NOTE — Telephone Encounter (Signed)
 Please give the order and have them check with patient about getting follow up labs done.  Thanks.

## 2024-02-18 NOTE — Telephone Encounter (Signed)
 [facility]ell Macintosh notified of the orders. Also patient is planning on doing labs sometime this week.

## 2024-02-21 ENCOUNTER — Other Ambulatory Visit: Payer: Self-pay | Admitting: Family Medicine

## 2024-02-22 LAB — CBC WITH DIFFERENTIAL/PLATELET
Basophils Absolute: 0.1 10*3/uL (ref 0.0–0.2)
Basos: 1 %
EOS (ABSOLUTE): 0.3 10*3/uL (ref 0.0–0.4)
Eos: 5 %
Hematocrit: 34.5 % (ref 34.0–46.6)
Hemoglobin: 10.7 g/dL — ABNORMAL LOW (ref 11.1–15.9)
Immature Grans (Abs): 0 10*3/uL (ref 0.0–0.1)
Immature Granulocytes: 0 %
Lymphocytes Absolute: 1.2 10*3/uL (ref 0.7–3.1)
Lymphs: 20 %
MCH: 29.4 pg (ref 26.6–33.0)
MCHC: 31 g/dL — ABNORMAL LOW (ref 31.5–35.7)
MCV: 95 fL (ref 79–97)
Monocytes Absolute: 0.5 10*3/uL (ref 0.1–0.9)
Monocytes: 9 %
Neutrophils Absolute: 3.9 10*3/uL (ref 1.4–7.0)
Neutrophils: 65 %
Platelets: 214 10*3/uL (ref 150–450)
RBC: 3.64 x10E6/uL — ABNORMAL LOW (ref 3.77–5.28)
RDW: 13.1 % (ref 11.7–15.4)
WBC: 6 10*3/uL (ref 3.4–10.8)

## 2024-02-22 LAB — COMPREHENSIVE METABOLIC PANEL WITH GFR
ALT: 9 IU/L (ref 0–32)
AST: 15 IU/L (ref 0–40)
Albumin: 4.1 g/dL (ref 3.9–4.9)
Alkaline Phosphatase: 148 IU/L — ABNORMAL HIGH (ref 44–121)
BUN/Creatinine Ratio: 22 (ref 12–28)
BUN: 19 mg/dL (ref 8–27)
Bilirubin Total: 0.3 mg/dL (ref 0.0–1.2)
CO2: 22 mmol/L (ref 20–29)
Calcium: 9 mg/dL (ref 8.7–10.3)
Chloride: 106 mmol/L (ref 96–106)
Creatinine, Ser: 0.85 mg/dL (ref 0.57–1.00)
Globulin, Total: 2.3 g/dL (ref 1.5–4.5)
Glucose: 90 mg/dL (ref 70–99)
Potassium: 4.6 mmol/L (ref 3.5–5.2)
Sodium: 143 mmol/L (ref 134–144)
Total Protein: 6.4 g/dL (ref 6.0–8.5)
eGFR: 74 mL/min/{1.73_m2} (ref >59)

## 2024-02-22 LAB — IRON: Iron: 55 ug/dL (ref 27–139)

## 2024-03-02 ENCOUNTER — Ambulatory Visit: Payer: Self-pay | Admitting: Family Medicine

## 2024-03-02 DIAGNOSIS — D649 Anemia, unspecified: Secondary | ICD-10-CM | POA: Insufficient documentation

## 2024-03-03 ENCOUNTER — Other Ambulatory Visit: Payer: Self-pay

## 2024-03-03 DIAGNOSIS — D649 Anemia, unspecified: Secondary | ICD-10-CM

## 2024-03-03 NOTE — Addendum Note (Signed)
 Addended by: TRUDY DAVENE CROME on: 03/03/2024 05:00 PM   Modules accepted: Orders

## 2024-04-11 ENCOUNTER — Other Ambulatory Visit (HOSPITAL_BASED_OUTPATIENT_CLINIC_OR_DEPARTMENT_OTHER): Payer: Self-pay | Admitting: Cardiovascular Disease

## 2024-04-28 ENCOUNTER — Other Ambulatory Visit (HOSPITAL_BASED_OUTPATIENT_CLINIC_OR_DEPARTMENT_OTHER): Payer: Self-pay | Admitting: Cardiovascular Disease

## 2024-04-28 ENCOUNTER — Other Ambulatory Visit: Payer: Self-pay | Admitting: Family Medicine

## 2024-04-28 DIAGNOSIS — I10 Essential (primary) hypertension: Secondary | ICD-10-CM

## 2024-04-28 DIAGNOSIS — I5032 Chronic diastolic (congestive) heart failure: Secondary | ICD-10-CM

## 2024-06-26 ENCOUNTER — Encounter: Payer: Self-pay | Admitting: Family Medicine

## 2024-07-29 ENCOUNTER — Other Ambulatory Visit: Payer: Self-pay | Admitting: Neurology

## 2024-08-26 ENCOUNTER — Ambulatory Visit (INDEPENDENT_AMBULATORY_CARE_PROVIDER_SITE_OTHER): Payer: Medicare Other

## 2024-08-26 VITALS — Ht 65.0 in | Wt 204.0 lb

## 2024-08-26 DIAGNOSIS — Z0000 Encounter for general adult medical examination without abnormal findings: Secondary | ICD-10-CM | POA: Diagnosis not present

## 2024-08-26 DIAGNOSIS — Z1231 Encounter for screening mammogram for malignant neoplasm of breast: Secondary | ICD-10-CM

## 2024-08-26 NOTE — Patient Instructions (Signed)
 Traci [name],  Thank you for taking the time for your Medicare Wellness Visit. I appreciate your continued commitment to your health goals. Please review the care plan we discussed, and feel free to reach out if I can assist you further.  Please note that Annual Wellness Visits do not include a physical exam. Some assessments may be limited, especially if the visit was conducted virtually. If needed, we may recommend an in-person follow-up with your provider.  Ongoing Care Seeing your primary care provider every 3 to 6 months helps us  monitor your health and provide consistent, personalized care.   Referrals If a referral was made during today's visit and you haven't received any updates within two weeks, please contact the referred provider directly to check on the status.  Recommended Screenings:  Health Maintenance  Topic Date Due   COVID-19 Vaccine (8 - 2025-26 season) 05/05/2024   Medicare Annual Wellness Visit  08/23/2024   Breast Cancer Screening  07/17/2024   Cologuard (Stool DNA test)  06/27/2025   DTaP/Tdap/Td vaccine (3 - Td or Tdap) 06/28/2033   Pneumococcal Vaccine for age over 89  Completed   Flu Shot  Completed   Osteoporosis screening with Bone Density Scan  Completed   Hepatitis C Screening  Completed   Zoster (Shingles) Vaccine  Completed   Meningitis B Vaccine  Aged Out   Hepatitis B Vaccine  Discontinued       08/24/2024    4:12 PM  Advanced Directives  Does Patient Have a Medical Advance Directive? No  Would patient like information on creating a medical advance directive? Yes (ED - Information included in AVS)    Vision: Annual vision screenings are recommended for early detection of glaucoma, cataracts, and diabetic retinopathy. These exams can also reveal signs of chronic conditions such as diabetes and high blood pressure.  Dental: Annual dental screenings help detect early signs of oral cancer, gum disease, and other conditions linked to overall  health, including heart disease and diabetes.

## 2024-08-26 NOTE — Progress Notes (Signed)
 "  Please attest and cosign this visit due to patients primary care provider not being in the office at the time the visit was completed.    No chief complaint on file.    Subjective:   Traci [name] is a 68 y.o. female who presents for a Medicare Annual Wellness Visit.  Visit info / Clinical Intake: Medicare Wellness Visit Type:: Subsequent Annual Wellness Visit Persons participating in visit and providing information:: patient Medicare Wellness Visit Mode:: Video Since this visit was completed virtually, some vitals may be partially provided or unavailable. Missing vitals are due to the limitations of the virtual format.: Unable to obtain vitals - no equipment If Telephone or Video please confirm:: I connected with patient using audio/video enable telemedicine. I verified patient identity with two identifiers, discussed telehealth limitations, and patient agreed to proceed. Patient Location:: home Provider Location:: office/home Interpreter Needed?: No Pre-visit prep was completed: yes AWV questionnaire completed by patient prior to visit?: yes Date:: 08/25/24 Living arrangements:: (!) (Patient-Rptd) lives alone Patient's Overall Health Status Rating: (Patient-Rptd) good Typical amount of pain: (Patient-Rptd) some Does pain affect daily life?: (!) (Patient-Rptd) yes Are you currently prescribed opioids?: no  Dietary Habits and Nutritional Risks How many meals a day?: (Patient-Rptd) 3 Eats fruit and vegetables daily?: (Patient-Rptd) yes Most meals are obtained by: (Patient-Rptd) preparing own meals In the last 2 weeks, have you had any of the following?: none Diabetic:: no  Functional Status Activities of Daily Living (to include ambulation/medication): (Patient-Rptd) Independent Ambulation: Independent with device- listed below Home Assistive Devices/Equipment: Johna Finder (specify Type); Eyeglasses Medication Administration: (Patient-Rptd) Independent Home Management  (perform basic housework or laundry): Needs assistance (comment) (has house cleaner and someone who walks the dog) Manage your own finances?: (Patient-Rptd) yes Primary transportation is: (Patient-Rptd) driving Concerns about vision?: no *vision screening is required for WTM* (vision blurry alot but since cataract surgery;no concern) Concerns about hearing?: no  Fall Screening Falls in the past year?: (Patient-Rptd) 1 Number of falls in past year: (Patient-Rptd) 0 Was there an injury with Fall?: (Patient-Rptd) 1 Fall Risk Category Calculator: (Patient-Rptd) 2 Patient Fall Risk Level: (Patient-Rptd) Moderate Fall Risk  Fall Risk Patient at Risk for Falls Due to: Orthopedic patient; Impaired mobility; Impaired balance/gait Fall risk Follow up: Falls evaluation completed; Education provided; Falls prevention discussed  Home and Transportation Safety: All rugs have non-skid backing?: (Patient-Rptd) yes All stairs or steps have railings?: (Patient-Rptd) yes Grab bars in the bathtub or shower?: (!) (Patient-Rptd) no Have non-skid surface in bathtub or shower?: (!) (Patient-Rptd) no Good home lighting?: (Patient-Rptd) yes Regular seat belt use?: (Patient-Rptd) yes Hospital stays in the last year:: (!) (Patient-Rptd) yes How many hospital stays:: (Patient-Rptd) 2  Cognitive Assessment Difficulty concentrating, remembering, or making decisions? : (Patient-Rptd) no Will 6CIT or Mini Cog be Completed: yes What year is it?: 0 points What month is it?: 0 points Give patient an address phrase to remember (5 components): [address] [place]  About what time is it?: 0 points Count backwards from 20 to 1: 0 points Say the months of the year in reverse: 0 points Repeat the address phrase from earlier: 0 points 6 CIT Score: 0 points  Advance Directives (For Healthcare) Does Patient Have a Medical Advance Directive?: No Would patient like information on creating a medical advance  directive?: Yes (ED - Information included in AVS)  Reviewed/Updated  Reviewed/Updated: Reviewed All (Medical, Surgical, Family, Medications, Allergies, Care Teams, Patient Goals)    Allergies (verified) Lexapro  [escitalopram   oxalate], Meperidine hcl, Simvastatin, Rexulti [brexpiprazole], Tape, Venlafaxine , Codeine, and Tramadol    Current Medications (verified) Outpatient Encounter Medications as of 08/26/2024  Medication Sig   alendronate  (FOSAMAX ) 70 MG tablet TAKE 1 TABLET BY MOUTH EVERY 7 DAYS WITH FULL GLASS OF WATER ON EMPTY STOMACH   apixaban (ELIQUIS) 2.5 MG TABS tablet Take 2.5 mg by mouth 2 (two) times daily.   aspirin  EC 81 MG tablet Take 81 mg by mouth daily.   Biotin 5 MG TABS Take 5 mg by mouth.   carbidopa -levodopa  (SINEMET  IR) 25-100 MG tablet TAKE 1.5 TABLETS BY MOUTH 3 TIMES DAILY.   Carbidopa -Levodopa  ER (SINEMET  CR) 25-100 MG tablet controlled release TAKE 1 TABLET BY MOUTH EVERYDAY AT BEDTIME   carvedilol  (COREG ) 12.5 MG tablet TAKE 1 TABLET (12.5MG  TOTAL) BY MOUTH TWICE A DAY WITH MEALS   Cholecalciferol (VITAMIN D ) 50 MCG (2000 UT) tablet Take 1 tablet (2,000 Units total) by mouth daily.   diclofenac  (VOLTAREN ) 75 MG EC tablet Take 75 mg by mouth 2 (two) times daily.   furosemide  (LASIX ) 40 MG tablet TAKE 1 TABLET BY MOUTH EVERY DAY   levothyroxine  (SYNTHROID ) 137 MCG tablet TAKE 1 TABLET BY MOUTH EVERY DAY BEFORE BREAKFAST   Multiple Vitamin (MULTIVITAMIN) tablet Take 1 tablet by mouth daily.   omeprazole  (PRILOSEC) 20 MG capsule TAKE 1 CAPSULE BY MOUTH EVERY DAY   rosuvastatin  (CRESTOR ) 20 MG tablet TAKE 1 TABLET BY MOUTH EVERY DAY   sertraline  (ZOLOFT ) 25 MG tablet Take 25 mg by mouth daily.   lidocaine  (LIDODERM ) 5 % Place 1 patch onto the skin daily. Remove & Discard patch within 12 hours or as directed by MD (Patient not taking: Reported on 08/26/2024)   No facility-administered encounter medications on file as of 08/26/2024.    History: Past Medical  History:  Diagnosis Date   Abnormal alkaline phosphatase test    with neg eval prev   Arthritis    CAD in native artery 07/13/2020   Chronic diastolic heart failure (HCC) 01/13/2021   Depression    Previously responded to Wellbutrin    Headache(784.0)    Hyperlipidemia    Hypothyroidism    Migraines    Osteoporosis    Fosamax  start 10/2020   Stroke (HCC) 12/2000   Thyroid  disease    Hypothyroidism   Past Surgical History:  Procedure Laterality Date   CATARACT EXTRACTION Bilateral    CHOLECYSTECTOMY     ? 20 years ago   JOINT REPLACEMENT     LAPAROSCOPIC GASTRIC BANDING  09/04/2004   LEFT HEART CATH AND CORONARY ANGIOGRAPHY N/A 11/22/2016   Procedure: Left Heart Cath and Coronary Angiography;  Surgeon: Deatrice DELENA Cage, MD;  Location: [facility] INVASIVE CV [facility];  Service: Cardiovascular;  Laterality: N/A;   TOTAL KNEE ARTHROPLASTY Right 11/26/2012   Procedure: TOTAL KNEE ARTHROPLASTY- right ;  Surgeon: Maude KANDICE Herald, MD;  Location: [facility] OR;  Service: Orthopedics;  Laterality: Right;   Family History  Problem Relation Age of Onset   Stroke Mother    Diabetes Mother    Heart attack Father        Deceased, 10   Cancer Other        Ovarian, Uterine   Heart disease Other    Melanoma Sister    Healthy Brother    Colon cancer Neg Hx    Breast cancer Neg Hx    Social History   Occupational History   Occupation: Scientist, Physiological: UNEMPLOYED  Tobacco Use  Smoking status: Never    Passive exposure: Never   Smokeless tobacco: Never  Vaping Use   Vaping status: Never Used  Substance and Sexual Activity   Alcohol use: No    Alcohol/week: 0.0 standard drinks of alcohol    Comment: maybe one on new years   Drug use: No   Sexual activity: Not on file   Tobacco Counseling Counseling given: Not Answered  SDOH Screenings   Food Insecurity: No Food Insecurity (08/26/2024)  Housing: Unknown (08/26/2024)  Transportation Needs: No Transportation Needs (08/26/2024)   Utilities: Not At Risk (08/26/2024)  Alcohol Screen: Low Risk (08/24/2023)  Depression (PHQ2-9): Medium Risk (08/26/2024)  Financial Resource Strain: Low Risk (08/24/2023)  Physical Activity: Inactive (08/26/2024)  Social Connections: Unknown (08/26/2024)  Stress: No Stress Concern Present (08/26/2024)  Tobacco Use: Low Risk (08/26/2024)  Health Literacy: Adequate Health Literacy (08/26/2024)   See flowsheets for full screening details  Depression Screen PHQ 2 & 9 Depression Scale- Over the past 2 weeks, how often have you been bothered by any of the following problems? Little interest or pleasure in doing things: 1 Feeling down, depressed, or hopeless (PHQ Adolescent also includes...irritable): 1 PHQ-2 Total Score: 2 Trouble falling or staying asleep, or sleeping too much: 2 Feeling tired or having little energy: 1 Poor appetite or overeating (PHQ Adolescent also includes...weight loss): 0 Feeling bad about yourself - or that you are a failure or have let yourself or your family down: 1 (no purpose due to health restrictions) Trouble concentrating on things, such as reading the newspaper or watching television (PHQ Adolescent also includes...like school work): 0 Moving or speaking so slowly that other people could have noticed. Or the opposite - being so fidgety or restless that you have been moving around a lot more than usual: 0 Thoughts that you would be better off dead, or of hurting yourself in some way: 1 PHQ-9 Total Score: 7 If you checked off any problems, how difficult have these problems made it for you to do your work, take care of things at home, or get along with other people?: Somewhat difficult  Depression Treatment Depression Interventions/Treatment : Currently on Treatment; Patient refuses Treatment (pt declines couinseling)     Goals Addressed             This Visit's Progress    Get through rt knee replacement in Feb 2026       Patient Stated   Not on  track    Pt wants to be able to walk independantly and on the beach;I would like to lose lbs             Objective:    Today's Vitals   08/26/24 1352  Weight: 204 lb (92.5 kg)  Height: 5' 5 (1.651 m)   Body mass index is 33.95 kg/m.  Hearing/Vision screen Vision Screening - Comments:: UTD w/Eye Visits to  Immunizations and Health Maintenance Health Maintenance  Topic Date Due   COVID-19 Vaccine (8 - 2025-26 season) 05/05/2024   Medicare Annual Wellness (AWV)  08/23/2024   Mammogram  07/17/2024   Fecal DNA (Cologuard)  06/27/2025   DTaP/Tdap/Td (3 - Td or Tdap) 06/28/2033   Pneumococcal Vaccine: 50+ Years  Completed   Influenza Vaccine  Completed   Bone Density Scan  Completed   Hepatitis C Screening  Completed   Zoster Vaccines- Shingrix  Completed   Meningococcal B Vaccine  Aged Out   Hepatitis B Vaccines 19-59 Average Risk  Discontinued  Assessment/Plan:  This is a routine wellness examination for [facility].  Patient Care Team: Cleatus Arlyss RAMAN, MD as PCP - General Raford Riggs, MD as PCP - Cardiology (Cardiology) Hedy, Temitope A as Consulting Physician (Neurology) [facility], MD (Ophthalmology)  I have personally reviewed and noted the following in the patients chart:   Medical and social history Use of alcohol, tobacco or illicit drugs  Current medications and supplements including opioid prescriptions. Functional ability and status Nutritional status Physical activity Advanced directives List of other physicians Hospitalizations, surgeries, and ER visits in previous 12 months Vitals Screenings to include cognitive, depression, and falls Referrals and appointments  No orders of the defined types were placed in this encounter.  In addition, I have reviewed and discussed with patient certain preventive protocols, quality metrics, and best practice recommendations. A written personalized care plan for preventive services  as well as general preventive health recommendations were provided to patient.   Erminio LITTIE Saris, LPN   [id number]/[id number]   No follow-ups on file.  After Visit Summary: (MyChart) Due to this being a telephonic visit, the after visit summary with patients personalized plan was offered to patient via MyChart   Nurse Notes: No voiced or noted concerns at this time Patient advised to keep follow-up appointment with PCP (Feb 2026) Appointment(s) made: (Feb 2026) HM Addressed: Pt will get covid vaccine at her pharmaacy when/if desires [facility] already ordered "

## 2024-09-06 ENCOUNTER — Other Ambulatory Visit: Payer: Self-pay | Admitting: Family Medicine

## 2024-09-08 NOTE — Telephone Encounter (Signed)
 Ok to refill dexa overdue?

## 2024-09-08 NOTE — Telephone Encounter (Signed)
 Please contact patient about scheduling DXA.  She if she wants to do it closer to home- let me know about her preferred site.  Rx sent.    Please see about getting follow up labs done- orders are in EMR.  Thanks.

## 2024-09-10 ENCOUNTER — Telehealth: Payer: Self-pay

## 2024-09-10 DIAGNOSIS — M810 Age-related osteoporosis without current pathological fracture: Secondary | ICD-10-CM

## 2024-09-10 DIAGNOSIS — E559 Vitamin D deficiency, unspecified: Secondary | ICD-10-CM

## 2024-09-10 DIAGNOSIS — I10 Essential (primary) hypertension: Secondary | ICD-10-CM

## 2024-09-10 DIAGNOSIS — D649 Anemia, unspecified: Secondary | ICD-10-CM

## 2024-09-10 NOTE — Telephone Encounter (Signed)
 Please advise.   Copied from CRM [id number]. Topic: Clinical - Request for Lab/Test Order >> Sep 10, 2024  2:16 PM Burnard DEL wrote: Reason for CRM: Patient is scheduled for CPE 10/07/2024. She would like to know if provider would like labs done before that appointment? If so she would need orders mailed to her for her to take to [facility]. Please advise

## 2024-09-14 NOTE — Telephone Encounter (Signed)
 I put in a set of new orders.  She should be able to get these done at [facility].  If needed, please print the requisitions and send those to the patient.  Thanks.

## 2024-09-14 NOTE — Addendum Note (Signed)
 Addended by: CLEATUS LORELI RAMAN on: 09/14/2024 11:32 PM   Modules accepted: Orders

## 2024-09-15 NOTE — Telephone Encounter (Signed)
 Mychart message sent to patient.

## 2024-10-02 ENCOUNTER — Ambulatory Visit: Payer: Self-pay | Admitting: Family Medicine

## 2024-10-02 LAB — TSH: TSH: 0.383 u[IU]/mL — ABNORMAL LOW (ref 0.450–4.500)

## 2024-10-02 LAB — CBC WITH DIFFERENTIAL/PLATELET
Basophils Absolute: 0.1 10*3/uL (ref 0.0–0.2)
Basos: 1 %
EOS (ABSOLUTE): 0.2 10*3/uL (ref 0.0–0.4)
Eos: 3 %
Hematocrit: 35.4 % (ref 34.0–46.6)
Hemoglobin: 11.5 g/dL (ref 11.1–15.9)
Immature Grans (Abs): 0 10*3/uL (ref 0.0–0.1)
Immature Granulocytes: 0 %
Lymphocytes Absolute: 1.2 10*3/uL (ref 0.7–3.1)
Lymphs: 17 %
MCH: 30.3 pg (ref 26.6–33.0)
MCHC: 32.5 g/dL (ref 31.5–35.7)
MCV: 93 fL (ref 79–97)
Monocytes Absolute: 0.6 10*3/uL (ref 0.1–0.9)
Monocytes: 10 %
Neutrophils Absolute: 4.6 10*3/uL (ref 1.4–7.0)
Neutrophils: 69 %
Platelets: 227 10*3/uL (ref 150–450)
RBC: 3.8 x10E6/uL (ref 3.77–5.28)
RDW: 12.4 % (ref 11.7–15.4)
WBC: 6.8 10*3/uL (ref 3.4–10.8)

## 2024-10-02 LAB — LIPID PANEL
Chol/HDL Ratio: 3 ratio (ref 0.0–4.4)
Cholesterol, Total: 104 mg/dL (ref 100–199)
HDL: 35 mg/dL — ABNORMAL LOW
LDL Chol Calc (NIH): 44 mg/dL (ref 0–99)
Triglycerides: 144 mg/dL (ref 0–149)
VLDL Cholesterol Cal: 25 mg/dL (ref 5–40)

## 2024-10-02 LAB — IRON: Iron: 55 ug/dL (ref 27–139)

## 2024-10-02 LAB — VITAMIN D 25 HYDROXY (VIT D DEFICIENCY, FRACTURES): Vit D, 25-Hydroxy: 60.3 ng/mL (ref 30.0–100.0)

## 2024-10-02 LAB — COMPREHENSIVE METABOLIC PANEL WITH GFR
ALT: 8 [IU]/L (ref 0–32)
AST: 21 [IU]/L (ref 0–40)
Albumin: 4.2 g/dL (ref 3.9–4.9)
Alkaline Phosphatase: 123 [IU]/L (ref 49–135)
BUN/Creatinine Ratio: 25 (ref 12–28)
BUN: 20 mg/dL (ref 8–27)
Bilirubin Total: 0.3 mg/dL (ref 0.0–1.2)
CO2: 21 mmol/L (ref 20–29)
Calcium: 8.8 mg/dL (ref 8.7–10.3)
Chloride: 104 mmol/L (ref 96–106)
Creatinine, Ser: 0.8 mg/dL (ref 0.57–1.00)
Globulin, Total: 2.2 g/dL (ref 1.5–4.5)
Glucose: 98 mg/dL (ref 70–99)
Potassium: 4.8 mmol/L (ref 3.5–5.2)
Sodium: 143 mmol/L (ref 134–144)
Total Protein: 6.4 g/dL (ref 6.0–8.5)
eGFR: 79 mL/min/{1.73_m2}

## 2024-10-02 LAB — VITAMIN B12: Vitamin B-12: >2000 pg/mL — ABNORMAL HIGH (ref 232–1245)

## 2024-10-07 ENCOUNTER — Encounter: Admitting: Family Medicine

## 2025-01-02 ENCOUNTER — Ambulatory Visit (HOSPITAL_BASED_OUTPATIENT_CLINIC_OR_DEPARTMENT_OTHER): Admitting: Family
